# Patient Record
Sex: Female | Born: 1965
Health system: Southern US, Community
[De-identification: ages and names within clinical notes are randomized; demographics above are authoritative.]

## PROBLEM LIST (undated history)

## (undated) DIAGNOSIS — R51 Headache: Secondary | ICD-10-CM

## (undated) DIAGNOSIS — I671 Cerebral aneurysm, nonruptured: Secondary | ICD-10-CM

## (undated) DIAGNOSIS — F32A Depression, unspecified: Secondary | ICD-10-CM

## (undated) DIAGNOSIS — I639 Cerebral infarction, unspecified: Secondary | ICD-10-CM

## (undated) DIAGNOSIS — I6381 Other cerebral infarction due to occlusion or stenosis of small artery: Secondary | ICD-10-CM

## (undated) DIAGNOSIS — M1990 Unspecified osteoarthritis, unspecified site: Secondary | ICD-10-CM

## (undated) DIAGNOSIS — R519 Headache, unspecified: Secondary | ICD-10-CM

## (undated) DIAGNOSIS — F419 Anxiety disorder, unspecified: Secondary | ICD-10-CM

## (undated) DIAGNOSIS — R569 Unspecified convulsions: Secondary | ICD-10-CM

## (undated) DIAGNOSIS — K219 Gastro-esophageal reflux disease without esophagitis: Secondary | ICD-10-CM

## (undated) DIAGNOSIS — I82409 Acute embolism and thrombosis of unspecified deep veins of unspecified lower extremity: Secondary | ICD-10-CM

## (undated) DIAGNOSIS — R112 Nausea with vomiting, unspecified: Secondary | ICD-10-CM

## (undated) DIAGNOSIS — N809 Endometriosis, unspecified: Secondary | ICD-10-CM

## (undated) DIAGNOSIS — R000 Tachycardia, unspecified: Secondary | ICD-10-CM

## (undated) DIAGNOSIS — G4730 Sleep apnea, unspecified: Secondary | ICD-10-CM

## (undated) DIAGNOSIS — F329 Major depressive disorder, single episode, unspecified: Secondary | ICD-10-CM

## (undated) DIAGNOSIS — Z9889 Other specified postprocedural states: Secondary | ICD-10-CM

## (undated) HISTORY — DX: Major depressive disorder, single episode, unspecified: F32.9

## (undated) HISTORY — DX: Depression, unspecified: F32.A

## (undated) HISTORY — DX: Tachycardia, unspecified: R00.0

## (undated) HISTORY — DX: Other cerebral infarction due to occlusion or stenosis of small artery: I63.81

## (undated) HISTORY — PX: OTHER SURGICAL HISTORY: SHX169

## (undated) HISTORY — DX: Anxiety disorder, unspecified: F41.9

## (undated) HISTORY — PX: BREAST BIOPSY: SHX20

## (undated) HISTORY — PX: BRAIN SURGERY: SHX531

## (undated) HISTORY — DX: Endometriosis, unspecified: N80.9

---

## 1998-10-31 ENCOUNTER — Other Ambulatory Visit: Admission: RE | Admit: 1998-10-31 | Discharge: 1998-10-31 | Payer: Self-pay | Admitting: Gynecology

## 1999-12-10 ENCOUNTER — Other Ambulatory Visit: Admission: RE | Admit: 1999-12-10 | Discharge: 1999-12-10 | Payer: Self-pay | Admitting: Gynecology

## 2000-12-03 ENCOUNTER — Inpatient Hospital Stay (HOSPITAL_COMMUNITY): Admission: EM | Admit: 2000-12-03 | Discharge: 2000-12-04 | Payer: Self-pay | Admitting: Emergency Medicine

## 2000-12-03 ENCOUNTER — Encounter: Payer: Self-pay | Admitting: Emergency Medicine

## 2000-12-14 ENCOUNTER — Emergency Department (HOSPITAL_COMMUNITY): Admission: EM | Admit: 2000-12-14 | Discharge: 2000-12-14 | Payer: Self-pay | Admitting: Emergency Medicine

## 2000-12-17 ENCOUNTER — Other Ambulatory Visit (HOSPITAL_COMMUNITY): Admission: RE | Admit: 2000-12-17 | Discharge: 2000-12-27 | Payer: Self-pay | Admitting: Psychiatry

## 2001-02-17 ENCOUNTER — Other Ambulatory Visit: Admission: RE | Admit: 2001-02-17 | Discharge: 2001-02-17 | Payer: Self-pay | Admitting: Gynecology

## 2002-07-20 ENCOUNTER — Other Ambulatory Visit: Admission: RE | Admit: 2002-07-20 | Discharge: 2002-07-20 | Payer: Self-pay | Admitting: Gynecology

## 2003-07-24 ENCOUNTER — Other Ambulatory Visit: Admission: RE | Admit: 2003-07-24 | Discharge: 2003-07-24 | Payer: Self-pay | Admitting: Gynecology

## 2010-12-20 LAB — TB SKIN TEST
Induration: 0 mm
TB Skin Test: NEGATIVE

## 2013-12-10 LAB — HM PAP SMEAR: HM Pap smear: NORMAL

## 2014-03-22 ENCOUNTER — Telehealth: Payer: Self-pay

## 2014-03-22 NOTE — Telephone Encounter (Signed)
Patient has been under a lot of stress lately.  Difficultly sleeping.  She just recently moved to  from [facility].  Having personal issues with husband.  Currently seeing a counselor.  Had a really bad episode of anxiety on 03/21/14. Stated she almost went to the ER.  Hx. Depression and anxiety.    Medication and allergies:  Reviewed and updated  90 day supply/mail order: n/a Local pharmacy:  [id number] - [place], [place] [address]   Immunizations due:  UTD   A/P: Personal, family history and past surgical hx: Reviewed and updated PAP- 12/2013- normal per patient CCS- has never had one MMG- 02/2013- normal per patient Flu- did not receive  Tdap- been within the last 10 years per patient   To Discuss with Provider: Nothing at this time.

## 2014-03-23 ENCOUNTER — Encounter: Payer: Self-pay | Admitting: Family Medicine

## 2014-03-23 ENCOUNTER — Ambulatory Visit (INDEPENDENT_AMBULATORY_CARE_PROVIDER_SITE_OTHER): Payer: BC Managed Care – PPO | Admitting: Family Medicine

## 2014-03-23 VITALS — BP 130/86 | HR 88 | Temp 98.2°F | Resp 16 | Wt 200.0 lb

## 2014-03-23 DIAGNOSIS — F418 Other specified anxiety disorders: Secondary | ICD-10-CM

## 2014-03-23 DIAGNOSIS — F341 Dysthymic disorder: Secondary | ICD-10-CM

## 2014-03-23 MED ORDER — BUPROPION HCL ER (XL) 300 MG PO TB24: 300.0000 mg | ORAL_TABLET | Freq: Every day | ORAL | Status: DC

## 2014-03-23 MED ORDER — ALPRAZOLAM 0.5 MG PO TABS: 0.5000 mg | ORAL_TABLET | Freq: Two times a day (BID) | ORAL | Status: DC | PRN

## 2014-03-23 MED ORDER — TRAZODONE HCL 50 MG PO TABS: 25.0000 mg | ORAL_TABLET | Freq: Every evening | ORAL | Status: DC | PRN

## 2014-03-23 NOTE — Progress Notes (Signed)
   Subjective:    Patient ID: Melissa Morgan, female    DOB: 1965/11/21, 48 y.o.   MRN: [id number]  HPI New to establish.  Previous MD- in [facility]  Anxiety/depression- pt reports she is 'recently and abruptly' separated.  Moved from [facility] to [place].  Seeing therapist.  Pt has hx of similar and is on Wellbutrin SR BID.  Currently having 'horrible, horrible anxiety attacks'.  'unbearable'.  Palpitations, SOB, sweating.  Taking alprazolam as needed for panic attacks and at night for sleep.   Review of Systems For ROS see HPI     Objective:   Physical Exam  Vitals reviewed. Constitutional: She is oriented to person, place, and time. She appears well-developed and well-nourished. She appears distressed (tearful, obviously upset).  Neck: Normal range of motion. Neck supple. No thyromegaly present.  Cardiovascular: Normal rate, regular rhythm and normal heart sounds.   Pulmonary/Chest: Effort normal and breath sounds normal. No respiratory distress. She has no wheezes. She has no rales.  Lymphadenopathy:    She has no cervical adenopathy.  Neurological: She is alert and oriented to person, place, and time.  Skin: Skin is warm and dry.  Psychiatric:  Tearful, very anxious          Assessment & Plan:

## 2014-03-23 NOTE — Progress Notes (Signed)
Pre visit review using our clinic review tool, if applicable. No additional management support is needed unless otherwise documented below in the visit note. 

## 2014-03-23 NOTE — Patient Instructions (Signed)
Follow up in 2-3 weeks to recheck mood Start the new Wellbutrin- just once daily Use the xanax as needed for those panicked moments Start the Trazodone nightly for sleep Call with any questions or concerns Hang in there!!!

## 2014-03-23 NOTE — Assessment & Plan Note (Signed)
New.  Acute and severe.  Increase Wellbutrin to 300mg .  Refill Xanax for prn use.  Applauded pt's efforts to set up counseling.  Will follow closely.

## 2014-04-13 ENCOUNTER — Encounter: Payer: Self-pay | Admitting: Family Medicine

## 2014-04-13 ENCOUNTER — Ambulatory Visit (INDEPENDENT_AMBULATORY_CARE_PROVIDER_SITE_OTHER): Payer: BC Managed Care – PPO | Admitting: Family Medicine

## 2014-04-13 VITALS — BP 128/82 | HR 86 | Temp 98.0°F | Resp 16 | Wt 196.5 lb

## 2014-04-13 DIAGNOSIS — F418 Other specified anxiety disorders: Secondary | ICD-10-CM

## 2014-04-13 DIAGNOSIS — F341 Dysthymic disorder: Secondary | ICD-10-CM

## 2014-04-13 NOTE — Patient Instructions (Signed)
Follow up as scheduled in August Continue the Wellbutrin daily Call with any questions or concerns YOU ARE DOING IT!  You should be proud! Hang in there!

## 2014-04-13 NOTE — Progress Notes (Signed)
Pre visit review using our clinic review tool, if applicable. No additional management support is needed unless otherwise documented below in the visit note. 

## 2014-04-13 NOTE — Assessment & Plan Note (Signed)
Improved since increasing Wellbutrin.  Pt in counseling.  Now has her dog w/ her.  Has enrolled in classes.  Looking for work.  All of these are very positive steps and I applauded her efforts.  Will follow.

## 2014-04-13 NOTE — Progress Notes (Signed)
   Subjective:    Patient ID: Melissa Morgan, female    DOB: 12-Apr-1966, 48 y.o.   MRN: [id number]  HPI Depression- started on Wellbutrin 300 at last visit.  Pt reports 'i'm doing a little better'.  Still having difficulty w/ motivation.  Currently in therapy.  Pt got custody of the dog.  Is still looking for a job- very frustrated by this.  Has enrolled in phlebotomy course.     Review of Systems For ROS see HPI     Objective:   Physical Exam  Vitals reviewed. Constitutional: She is oriented to person, place, and time. She appears well-developed and well-nourished. No distress.  HENT:  Head: Normocephalic and atraumatic.  Neurological: She is alert and oriented to person, place, and time.  Skin: Skin is warm and dry.  Psychiatric: She has a normal mood and affect. Her behavior is normal. Thought content normal.          Assessment & Plan:

## 2014-05-17 ENCOUNTER — Emergency Department (HOSPITAL_BASED_OUTPATIENT_CLINIC_OR_DEPARTMENT_OTHER): Payer: BC Managed Care – PPO

## 2014-05-17 ENCOUNTER — Encounter (HOSPITAL_BASED_OUTPATIENT_CLINIC_OR_DEPARTMENT_OTHER): Payer: Self-pay | Admitting: Emergency Medicine

## 2014-05-17 ENCOUNTER — Emergency Department (HOSPITAL_BASED_OUTPATIENT_CLINIC_OR_DEPARTMENT_OTHER)
Admission: EM | Admit: 2014-05-17 | Discharge: 2014-05-17 | Disposition: A | Discharge status: Home / Self Care | Payer: BC Managed Care – PPO | Attending: Emergency Medicine | Admitting: Emergency Medicine

## 2014-05-17 ENCOUNTER — Emergency Department (HOSPITAL_COMMUNITY): Payer: BC Managed Care – PPO

## 2014-05-17 DIAGNOSIS — R51 Headache: Secondary | ICD-10-CM | POA: Insufficient documentation

## 2014-05-17 DIAGNOSIS — F411 Generalized anxiety disorder: Secondary | ICD-10-CM | POA: Insufficient documentation

## 2014-05-17 DIAGNOSIS — R0602 Shortness of breath: Secondary | ICD-10-CM | POA: Insufficient documentation

## 2014-05-17 DIAGNOSIS — R112 Nausea with vomiting, unspecified: Secondary | ICD-10-CM | POA: Insufficient documentation

## 2014-05-17 DIAGNOSIS — R197 Diarrhea, unspecified: Secondary | ICD-10-CM | POA: Insufficient documentation

## 2014-05-17 DIAGNOSIS — I671 Cerebral aneurysm, nonruptured: Secondary | ICD-10-CM | POA: Insufficient documentation

## 2014-05-17 DIAGNOSIS — R079 Chest pain, unspecified: Secondary | ICD-10-CM | POA: Insufficient documentation

## 2014-05-17 HISTORY — DX: Headache, unspecified: R51.9

## 2014-05-17 HISTORY — DX: Headache: R51

## 2014-05-17 HISTORY — DX: Cerebral infarction, unspecified: I63.9

## 2014-05-17 LAB — BASIC METABOLIC PANEL
Anion gap: 17 — ABNORMAL HIGH (ref 5–15)
BUN: 16 mg/dL (ref 6–23)
CHLORIDE: 100 meq/L (ref 96–112)
CO2: 23 mEq/L (ref 19–32)
Calcium: 10.4 mg/dL (ref 8.4–10.5)
Creatinine, Ser: 0.8 mg/dL (ref 0.50–1.10)
GFR calc Af Amer: >90 mL/min (ref >90)
GFR calc non Af Amer: 86 mL/min — ABNORMAL LOW (ref >90)
GLUCOSE: 103 mg/dL — AB (ref 70–99)
POTASSIUM: 3.9 meq/L (ref 3.7–5.3)
SODIUM: 140 meq/L (ref 137–147)

## 2014-05-17 LAB — CBC WITH DIFFERENTIAL/PLATELET
Basophils Absolute: 0 10*3/uL (ref 0.0–0.1)
Basophils Relative: 0 % (ref 0–1)
Eosinophils Absolute: 0.1 10*3/uL (ref 0.0–0.7)
Eosinophils Relative: 1 % (ref 0–5)
HCT: 41.6 % (ref 36.0–46.0)
Hemoglobin: 14.4 g/dL (ref 12.0–15.0)
Lymphocytes Relative: 36 % (ref 12–46)
Lymphs Abs: 3.6 10*3/uL (ref 0.7–4.0)
MCH: 30.4 pg (ref 26.0–34.0)
MCHC: 34.6 g/dL (ref 30.0–36.0)
MCV: 87.9 fL (ref 78.0–100.0)
Monocytes Absolute: 0.9 10*3/uL (ref 0.1–1.0)
Monocytes Relative: 9 % (ref 3–12)
Neutro Abs: 5.3 10*3/uL (ref 1.7–7.7)
Neutrophils Relative %: 53 % (ref 43–77)
Platelets: 243 10*3/uL (ref 150–400)
RBC: 4.73 MIL/uL (ref 3.87–5.11)
RDW: 13.5 % (ref 11.5–15.5)
WBC: 10 10*3/uL (ref 4.0–10.5)

## 2014-05-17 LAB — TROPONIN I

## 2014-05-17 MED ORDER — SODIUM CHLORIDE 0.9 % IV SOLN
Freq: Once | INTRAVENOUS | Status: AC
  Administered 2014-05-17: 15:00:00 via INTRAVENOUS

## 2014-05-17 MED ORDER — HYDROCODONE-ACETAMINOPHEN 5-325 MG PO TABS: 1.0000 | ORAL_TABLET | ORAL | Status: DC | PRN

## 2014-05-17 MED ORDER — ONDANSETRON HCL 4 MG/2ML IJ SOLN
4.0000 mg | Freq: Once | INTRAMUSCULAR | Status: AC
  Administered 2014-05-17: 4 mg via INTRAVENOUS
  Filled 2014-05-17: qty 2

## 2014-05-17 MED ORDER — MORPHINE SULFATE 4 MG/ML IJ SOLN
4.0000 mg | Freq: Once | INTRAMUSCULAR | Status: AC
  Administered 2014-05-17: 4 mg via INTRAVENOUS
  Filled 2014-05-17: qty 1

## 2014-05-17 MED ORDER — LORAZEPAM 2 MG/ML IJ SOLN
INTRAMUSCULAR | Status: AC
  Filled 2014-05-17: qty 1

## 2014-05-17 MED ORDER — LORAZEPAM 2 MG/ML IJ SOLN
1.0000 mg | Freq: Once | INTRAMUSCULAR | Status: AC
  Administered 2014-05-17: 1 mg via INTRAVENOUS

## 2014-05-17 MED ORDER — ONDANSETRON 8 MG PO TBDP: 8.0000 mg | ORAL_TABLET | Freq: Three times a day (TID) | ORAL | Status: DC | PRN

## 2014-05-17 NOTE — ED Notes (Signed)
Pt transported to MRI 

## 2014-05-17 NOTE — Discharge Instructions (Signed)
Cerebral Aneurysm  An aneurysm is the bulging or ballooning out of part of the weakened wall of a vein or artery. An aneurysm in the vein or artery of the brain is called a brain aneurysm, or cerebral aneurysm.   Aneurysms are a risk to your health because they may leak or rupture. Once the aneurysm leaks or ruptures, bleeding occurs. If the bleeding occurs within the brain tissue, the condition is called an intracerebral hemorrhage. An intracerebral hemorrhage can result in a hemorrhagic stroke. If the bleeding occurs in the area between the brain and the thin tissues that cover the brain, the condition is called a subarachnoid hemorrhage. This increases the pressure on the brain and causes some areas of the brain to not get the necessary blood flow. The blood from the ruptured aneurysm collects and presses on the surrounding brain tissue. A subarachnoid hemorrhage can cause a stroke. A ruptured cerebral aneurysm is a medical emergency. This can cause permanent damage and loss of brain function.  CAUSES  A cerebral aneurysm is caused when a weakened part of the blood vessel expands. The blood vessel expands due to the constant pressure from the flow of blood through the weakened blood vessel. Usually the aneurysm expands slowly. As the weakened aneurysm expands, the walls of the aneurysm become weaker. Aneurysms may be associated with diseases that weaken and damage the walls of your blood vessels or blood vessels that develop abnormally. Some known causes for cerebral aneurysms are:  · Head trauma.  · Infection.  · Use of "recreational drugs" such as cocaine or amphetamines.  RISK FACTORS  People at risk for a cerebral aneurysm or hemorrhagic stroke usually have one or more risk factors, which include:  · Having high blood pressure (hypertension).  · Abusing alcohol.  · Having abnormal blood vessels present since birth.  · Having certain bleeding disorders, such as hemophilia, sickle cell disease, or liver  disease.  · Taking blood thinners (anticoagulants).  · Smoking.  SIGNS AND SYMPTOMS   The signs and symptoms of an unruptured cerebral aneurysm will partly depend on its size and rate of growth. A small, unchanging aneurysm generally does not produce symptoms. A larger aneurysm that is steadily growing can increase pressure on the brain or nerves. That increased pressure from the unruptured cerebral aneurysm can cause:  · A headache.  · Problems with your vision.  · Numbness or weakness in an arm or leg.  · Problems with memory.  · Problems speaking.  · Seizures.  If an aneurysm leaks or bursts, it can cause a stroke and be life threatening. Symptoms may include:  · A sudden, severe headache with no known cause. The headache is often described as the worst headache ever experienced.  · Nausea or vomiting, especially when combined with other symptoms such as a headache.  · Sudden weakness or numbness of the face, arm, or leg, especially on one side of the body.  · Sudden trouble walking or difficulty moving arms or legs.  · Sudden confusion.  · Sudden personality changes.  · Trouble speaking (aphasia) or understanding.  · Difficulty swallowing.  · Sudden trouble seeing in one or both eyes.  · Double vision.  · Dizziness.  · Loss of balance or coordination.  · Intolerance to light.  · Stiff neck.  DIAGNOSIS   A CTA (computed tomographic angiography) may be performed to diagnose an aneurysm. A CTA uses dye and a CT scanner to take images of your blood vessels.   a cerebral angiogram. A cerebral angiogram requires a tube called a catheter to be inserted into a blood vessel and advanced to the blood vessels in your neck. Dye is then injected while X-ray images are taken to show the blood vessels in  your brain. TREATMENT  Unruptured Aneurysms Treatment is complex when an aneurysm is found and it is not causing problems. Treatment is very individualized, as each case is different. Many things must be considered, such as the size and exact location of your aneurysm, your age, your overall health, and your feelings and preferences. Small aneurysms in certain locations of the brain have a very low chance of bleeding or rupturing. These small aneurysms may not be treated. However, depending on the size and location of the aneurysm, treatments may be recommended and include:  Coiling. During this procedure, a catheter is inserted and advanced through a blood vessel. Once the catheter reaches the aneurysm, tiny coils are used to block blood flow into the aneurysm.  Surgical clipping. During surgery, a clip is placed at the base of the aneurysm. The clip prevents blood from continuing to enter the aneurysm. Ruptured Aneurysms Immediate emergency surgery may be needed to help prevent damage to the brain and to reduce the risk of rebleeding. Timing of treatment is an important factor in the prevention of complications. Successful early treatment of a ruptured aneurysm (within the first 3 days of a bleed) helps to prevent rebleeding and blood vessel spasm. In some cases, there may be a reason to treat later (10-14 days after a rupture). Many things are considered when making this decision and each case is handled individually. HOME CARE INSTRUCTIONS  Take all your medicines exactly as instructed. Do not take any over-the-counter drugs or supplements without talking to your health care provider.  Eat healthy foods. It is recommended that you eat 5 or more servings of fruits and vegetables each day. Foods may need to be a special consistency (soft or pureed), or small bites may need to be taken if you have had a ruptured aneurysm or stroke. Certain diets may be prescribed to address high blood pressure, high  cholesterol, diabetes, or obesity.  A low salt (sodium), low saturated fat, low trans fat, low cholesterol diet is recommended to manage high blood pressure.  A low saturated fat, low trans fat, low cholesterol, and high fiber diet is recommended to control cholesterol levels.  A controlled carbohydrate, controlled sugar diet is recommended to manage diabetes.  A reduced calorie, low sodium, low saturated fat, low trans fat, low cholesterol diet is recommended to manage obesity.  Maintain a healthy weight.  Stay physically active. It is recommended that you get at least 30 minutes of activity on most or all days.  Do not smoke.  Limit alcohol use. Moderate alcohol use is considered to be:  No more than 2 drinks each day for men.  No more than 1 drink each day for nonpregnant women.  Stop drug abuse.  A safe home environment is important to reduce the risk of falls. Your health care provider may arrange for specialists to evaluate your home. Having grab bars in the bedroom and bathroom is often important. Your health care provider may arrange for special equipment to be used at home, such as raised toilets and a seat for the shower.  Physical, occupational, and speech therapy. Ongoing therapy may be needed to maximize your recovery after a ruptured aneurysm or stroke. If you have been advised to use a  walker or a cane, use it at all times. Be sure to keep your therapy appointments.  Follow all instructions for follow up with your health care provider. This is very important. This includes any referrals, physical therapy, rehabilitation, and laboratory tests. Proper follow up may prevent an aneurysm rupture or a stroke. SEEK IMMEDIATE MEDICAL CARE IF:  You have a sudden, severe headache with no known cause.  You have sudden nausea or vomiting with a severe headache.  You have sudden weakness or numbness of the face, arm, or leg, especially on one side of the body.  You have sudden  trouble walking or difficulty moving arms or legs.  You have sudden confusion.  You have trouble speaking or understanding.  You have sudden trouble seeing in one or both eyes.  You have a sudden loss of balance or coordination.  You have a stiff neck.  You have difficulty breathing.  You have a partial or total loss of consciousness. Any of these symptoms may represent a serious problem that is an emergency. Do not wait to see if the symptoms will go away. Get medical help at once. Call your local emergency services (911 in [place] Do not drive yourself to the hospital. Document Released: 07/18/2002 Document Revised: 08/16/2013 Document Reviewed: 04/13/2013 [facility] Patient Information 2015 [place]. This information is not intended to replace advice given to you by your health care provider. Make sure you discuss any questions you have with your health care provider.

## 2014-05-17 NOTE — ED Notes (Signed)
Reports given to Gilbert charge at [facility] , [place] to transport

## 2014-05-17 NOTE — ED Notes (Signed)
Patient transported to MRI 

## 2014-05-17 NOTE — ED Notes (Signed)
Per EMS, pt coming from [facility] fo an MRI concerning a aneurism. Pt alert x 4. No deficits at this time. Pt is experiencing left arm numbness.

## 2014-05-17 NOTE — ED Notes (Signed)
Pt placed on monitor. Pt monitored by 12 lead, blood pressure, and pulse ox.

## 2014-05-17 NOTE — ED Provider Notes (Signed)
Pt was transferred [facility] for MRI MRA after having a probable 9 mm aneurysm noted on CT scan.  Pt presented with complaints of headache and tingling in her right hand. Please see Dr Ross Marcus original note for further information. Pt does still complain of  A headache. Physical Exam  BP 110/74  Pulse 80  Temp(Src) 98.2 F (36.8 C) (Oral)  Resp 13  Ht 5\' 7"  (1.702 m)  Wt 196 lb (88.905 kg)  BMI 30.69 kg/m2  SpO2 98%  Physical Exam  Nursing note and vitals reviewed. Constitutional: She is oriented to person, place, and time. She appears well-developed and well-nourished. No distress.  HENT:  Head: Normocephalic and atraumatic.  Right Ear: External ear normal.  Left Ear: External ear normal.  Eyes: Conjunctivae are normal. Right eye exhibits no discharge. Left eye exhibits no discharge. No scleral icterus.  Neck: Neck supple. No tracheal deviation present.  Cardiovascular: Normal rate.   Pulmonary/Chest: Effort normal. No stridor. No respiratory distress.  Musculoskeletal: She exhibits no edema.  Neurological: She is alert and oriented to person, place, and time. She has normal strength. She displays no tremor. No cranial nerve deficit (no facial droop, nl speech, EOMI) or sensory deficit. She exhibits normal muscle tone.  No pronator drift, 5/5 ue and le strength  Skin: Skin is warm and dry. No rash noted.  Psychiatric: She has a normal mood and affect.    ED Course  Procedures  MDM MRI MRA ordered.  Will continue to monitor  MRI MRA results were reviewed with Dr Ronnald Ramp who was discussing the case with Dr Cyndy Freeze.  No signs of hemorrhage.  Do not feel that lp is necessary at this time.  Pt is comfortable.  She does have history of recurrent headaches.  It is possible this is an incidental finding.  She will follow up with Dr Cyndy Freeze as an outpatient in the office.     Dorie Rank, MD 05/17/14 1945

## 2014-05-17 NOTE — ED Notes (Signed)
Dineen Kid pts friend can be called with any need can be reached at [phone] or [phone]

## 2014-05-17 NOTE — ED Notes (Addendum)
Talk with EMS rep [phone] [name], stated that she would send out a truck

## 2014-05-17 NOTE — ED Notes (Signed)
Pt having some diarrhea, with nausea since Saturday.  Pt started having anxiety since yesterday.  Pt diaphoretic, hyperventilating, having numbness in hands, and feels like she is "going to pass out".

## 2014-05-17 NOTE — ED Provider Notes (Signed)
CSN: [id number]     Arrival date & time 05/17/14  1318 History   First MD Initiated Contact with Patient 05/17/14 1404     Chief Complaint  Patient presents with  . Anxiety  . Dizziness     (Consider location/radiation/quality/duration/timing/severity/associated sxs/prior Treatment) HPI Comments: Patient presents to the ER for evaluation of multiple complaints. Patient reports that she had a severe right-sided headache yesterday which resolved during the course of the day. She had onset of headache once again around 12:30 today. She noticed left hand numbness and tingling with the onset of a headache. She felt dizzy like he was going to pass out. This developed into what she thinks was a panic attack. She started having racing heartbeat, nausea, diarrhea, diaphoresis, shortness of breath with hyperventilation and chest discomfort. These chest and breathing difficulties are improving.  Patient tells me that she has a history of a lacunar infarct, but also that there was bleeding in her brain without stroke. [place], records are not immediately available.  Patient is a 48 y.o. female presenting with anxiety and dizziness.  Anxiety Associated symptoms include chest pain, headaches and shortness of breath.  Dizziness Associated symptoms: chest pain, diarrhea, headaches, nausea, shortness of breath and vomiting     Past Medical History  Diagnosis Date  . Depression   . Tachycardia   . Anxiety   . Lacunar infarction   . Endometriosis   . Stroke   . Headache    Past Surgical History  Procedure Laterality Date  . Lasar laparoscopy      for endometriosis   Family History  Problem Relation Age of Onset  . Hypertension Brother     2 out of 3 brothers  . Heart attack Brother     2 out of 3 brothers  . Diabetes Brother   . Asthma Sister   . Hypertension Mother   . Heart attack Father   . Breast cancer Sister    History  Substance Use Topics  .  Smoking status: Current Some Day Smoker  . Smokeless tobacco: Never Used  . Alcohol Use: No   OB History   Grav Para Term Preterm Abortions TAB SAB Ect Mult Living                 Review of Systems  Respiratory: Positive for shortness of breath.   Cardiovascular: Positive for chest pain.  Gastrointestinal: Positive for nausea, vomiting and diarrhea.  Neurological: Positive for dizziness, numbness and headaches.  Psychiatric/Behavioral: The patient is nervous/anxious.   All other systems reviewed and are negative.     Allergies  Codeine and Imitrex  Home Medications   Prior to Admission medications   Medication Sig Start Date End Date Taking? Authorizing Provider  ALPRAZolam [name]) 0.5 MG tablet Take 1 tablet (0.5 mg total) by mouth 2 (two) times daily as needed for anxiety. 03/23/14   Midge Minium, MD  Ascorbic Acid (VITAMIN C) 1000 MG tablet Take 1,000 mg by mouth daily.    Historical Provider, MD  aspirin 81 MG tablet Take 81 mg by mouth daily.    Historical Provider, MD  buPROPion (WELLBUTRIN XL) 300 MG 24 hr tablet Take 1 tablet (300 mg total) by mouth daily. 03/23/14   Midge Minium, MD  Calcium Citrate-Vitamin D (CITRACAL + D PO) Take 1 tablet by mouth daily.    Historical Provider, MD  ibuprofen (ADVIL,MOTRIN) 800 MG tablet Take 800 mg by mouth  every 8 (eight) hours as needed.    Historical Provider, MD  metoprolol tartrate (LOPRESSOR) 25 MG tablet Take 25 mg by mouth 2 (two) times daily.    Historical Provider, MD  Omega-3 Fatty Acids (OMEGA-3 FISH OIL) 500 MG CAPS Take 1 capsule by mouth 2 (two) times daily.    Historical Provider, MD  traZODone (DESYREL) 50 MG tablet Take 0.5-1 tablets (25-50 mg total) by mouth at bedtime as needed for sleep. 03/23/14   Midge Minium, MD   BP 115/83  Pulse 88  Temp(Src) 98.2 F (36.8 C) (Oral)  Resp 16  Ht 5\' 7"  (1.702 m)  Wt 196 lb (88.905 kg)  BMI 30.69 kg/m2  SpO2 100% Physical Exam  Constitutional: She is  oriented to person, place, and time. She appears well-developed and well-nourished. No distress.  HENT:  Head: Normocephalic and atraumatic.  Right Ear: Hearing normal.  Left Ear: Hearing normal.  Nose: Nose normal.  Mouth/Throat: Oropharynx is clear and moist and mucous membranes are normal.  Eyes: Conjunctivae and EOM are normal. Pupils are equal, round, and reactive to light.  Neck: Normal range of motion. Neck supple.  Cardiovascular: Regular rhythm, S1 normal and S2 normal.  Exam reveals no gallop and no friction rub.   No murmur heard. Pulmonary/Chest: Effort normal and breath sounds normal. No respiratory distress. She exhibits no tenderness.  Abdominal: Soft. Normal appearance and bowel sounds are normal. There is no hepatosplenomegaly. There is no tenderness. There is no rebound, no guarding, no tenderness at McBurney's point and negative Murphy's sign. No hernia.  Musculoskeletal: Normal range of motion.  Neurological: She is alert and oriented to person, place, and time. She has normal strength. No cranial nerve deficit or sensory deficit. Coordination normal. GCS eye subscore is 4. GCS verbal subscore is 5. GCS motor subscore is 6.  Extraocular muscle movement: normal No visual field cut Pupils: equal and reactive both direct and consensual response is normal No nystagmus present        Sensory function is intact to light touch, pinprick Proprioception intact  Grip strength 5/5 symmetric in upper extremities Lower extremity strength 5/5 against gravity No pronator drift Normal finger to nose bilaterally Normal heel to shin bilaterally  Gait: normal    Skin: Skin is warm, dry and intact. No rash noted. No cyanosis.  Psychiatric: She has a normal mood and affect. Her speech is normal and behavior is normal. Thought content normal.    ED Course  Procedures (including critical care time) Labs Review Labs Reviewed  CBC WITH DIFFERENTIAL  BASIC METABOLIC PANEL   TROPONIN I    Imaging Review Ct Head Wo Contrast  05/17/2014   CLINICAL DATA:  Numbness in hands.  Diaphoresis.  EXAM: CT HEAD WITHOUT CONTRAST  TECHNIQUE: Contiguous axial images were obtained from the base of the skull through the vertex without intravenous contrast.  COMPARISON:  None.  FINDINGS: An approximately 9 mm hyperdense structure is noted in the region of the trifurcation of the right middle cerebral artery. This is most likely a right middle cerebral artery aneurysm. A subtle amount of adjacent hemorrhage cannot be excluded. Emergent neurosurgical consultation is suggested. No other focal abnormality. No hydrocephalus or mass effect. Visualized orbits unremarkable. Visualized paranasal sinuses and mastoids clear. No acute bony abnormality.  IMPRESSION: 9 mm hyperdense lesion in the region of the trifurcation of the right middle cerebral artery. This most likely represents an aneurysm of the middle cerebral artery. Adjacent subtle hemorrhage may be  present. Emergency neurosurgical consultation suggested. MRA can be obtained for further evaluation. Critical Value/emergent results were called by telephone at the time of interpretation on 05/17/2014 at 2:46 PM to Dr. Joseph Berkshire , who verbally acknowledged these results.   Electronically Signed   By: Marcello Moores  Register   On: 05/17/2014 14:48     EKG Interpretation   Date/Time:  Thursday May 17 2014 14:42:34 EDT Ventricular Rate:  77 PR Interval:  168 QRS Duration: 86 QT Interval:  398 QTC Calculation: 450 R Axis:   51 Text Interpretation:  Normal sinus rhythm Normal ECG Confirmed by Dalyn Kjos   MD, Mckaylah Bettendorf ([id number]) on 05/17/2014 2:46:45 PM      MDM   Final diagnoses:  Aneurysm of middle cerebral artery   Patient presents with many symptoms, I cannot explain all of them with one diagnosis. The patient has headache with left arm numbness and tingling, but no appreciable objective weakness. Because of this, a CAT scan was  ordered. I did consider the possibility of acute stroke, as the symptoms began 2 hours before my evaluation. She did, however tell me that she has a history of intracranial bleed, essentially ruling out use of TPA. CT scan shows a 9 mm aneurysm of the right MCA. Radiologist is concerned about the possibility of leaking of blood. This was discussed acutely with Doctor Ronnald Ramp, neurosurgery. He was not sure if there was evidence of leaking or not, recommend MRI and MRA. Patient will be transferred to [facility] for further treatment. Treatment, according to Doctor Ronnald Ramp, if no leakage from aneurysm, which simply need followup for the aneurysm as an outpatient. The patient has, however, been experiencing chest pain and also has a history of stroke with left hand numbness. Would likely need further workup for possible TIA versus CVA if negative leakage from aneurysm.  Case discussed briefly with Doctor Vanita Panda in [facility]. Patient will be transferred to the [facility] emergency department for MRI, MRA brain and further decisions will be made based on results.   Orpah Greek, MD 05/17/14 1536

## 2014-05-24 ENCOUNTER — Telehealth: Payer: Self-pay | Admitting: Family Medicine

## 2014-05-24 NOTE — Telephone Encounter (Signed)
Patient states that she went and purchased a fleets enema and used. She is feeling much better. Recently seen at ED has a follow with Dr Chaya Jan.

## 2014-05-24 NOTE — Telephone Encounter (Signed)
Caller name: Charolette  Call back [place]: Dahlia Bailiff   Reason for call:  Pt is having trouble pass stool.  Pt is feeling a lot of discomfort.  Pt wants to know what to do.

## 2014-05-30 ENCOUNTER — Telehealth: Payer: Self-pay | Admitting: Family Medicine

## 2014-05-30 MED ORDER — ALPRAZOLAM 0.5 MG PO TABS: 0.5000 mg | ORAL_TABLET | Freq: Two times a day (BID) | ORAL | Status: DC | PRN

## 2014-05-30 NOTE — Telephone Encounter (Signed)
Pt was diagnosed with aneurysm of middle cerebral artery on 7/9. Please advise.

## 2014-05-30 NOTE — Telephone Encounter (Signed)
Caller name: Milly  Call back [phone]   Reason for call:  Pt wants to discuss UC and ED visits.  They found an aneurism.  Wants to update Dr. Birdie Riddle.  Also is having a lot of anxiety due to this and is wanting to know if she can get a refill on rx  ALPRAZolam (XANAX) 0.5 MG tablet .  Please contact pt.

## 2014-05-30 NOTE — Telephone Encounter (Signed)
Med filled and pt notified.  

## 2014-05-30 NOTE — Telephone Encounter (Signed)
Is pt following up w/ Dr Christella Noa (Neurosurg) as stated in ER notes? [place] for refill on xanax

## 2014-06-06 ENCOUNTER — Telehealth: Payer: Self-pay

## 2014-06-06 DIAGNOSIS — G43901 Migraine, unspecified, not intractable, with status migrainosus: Secondary | ICD-10-CM

## 2014-06-06 MED ORDER — HYDROCODONE-ACETAMINOPHEN 5-325 MG PO TABS: 1.0000 | ORAL_TABLET | ORAL | Status: DC | PRN

## 2014-06-06 NOTE — Telephone Encounter (Signed)
Pt's daughter called back to give the name, Dr. Ashok Pall, [facility], [phone]

## 2014-06-06 NOTE — Telephone Encounter (Signed)
Caller: Melissa Morgan/Patient; PCP: Midge Minium.; CB#: [phone]; Call regarding Migraines; Has had migraines chronically for about a year, headache started last night 06/04/14 subsided some earlier today 06/05/14 but progressed to 8/10 on pain scale by this afternoon. + nausea, no vomiting. Has taken some Tylenol but is unable to take NSAIDs due to hx aneurysm. LMP- unknown approx 1 year ago. Afebrile. Triaged per Headache- Needs to see provider within 24 hrs for "Constant headache AND unrelieved with nonprescription medications." Per SO- Rx for Ultram and Phenergen called to [facility] on [address]. Care advice given, call back parameters discussed. Pt advised to f/u with office in am when open.  Patient has not called in. Please follow up with.

## 2014-06-06 NOTE — Telephone Encounter (Signed)
Discussed Dr. Birdie Riddle recommendations with patient.  Pt stated understanding and was in agreed with plan.    Med filled for Vicodin #30, 0 refills.  Prescription placed up front.  Pt made aware that prescription is ready for pick up.   Neuro referral placed.  Pt is aware.

## 2014-06-06 NOTE — Telephone Encounter (Signed)
Another option would be Hydrocodone/Acetaminophen (Vicodin) 5/325mg .  Pt has codeine allergy listed but only adverse effect noted is nausea and pt has medication available to treat nausea.  [place] for #30, no refills.  1 tab every 4-6 hrs as needed.  Pt has appt upcoming w/ neurosurgeon but if migraines are ongoing problem for pt, needs neuro referral

## 2014-06-06 NOTE — Telephone Encounter (Signed)
Pt also has imitrex listed as allergy so she definitely needs a neuro evaluation to have a better medication plan when migraines do occur

## 2014-06-06 NOTE — Telephone Encounter (Addendum)
Pt called to follow up.  Tramadol causes patient to feel like she is wide awake.  Did not sleep last night.  Last dose of Tramadol-4 am.  HA has improved.  Rated 3/10.  Wants something other than Tramadol prescribed.  Pt states she just cannot take having another headache as bad as she has been having the past couple of days.    Dx. Aneurysm of middle cerebral artery; Waiting to have CT Angiogram; Possible upcoming craniotomy.  Please advise.

## 2014-06-07 ENCOUNTER — Other Ambulatory Visit (HOSPITAL_COMMUNITY): Payer: Self-pay | Admitting: Neurosurgery

## 2014-06-07 DIAGNOSIS — I671 Cerebral aneurysm, nonruptured: Secondary | ICD-10-CM

## 2014-06-08 ENCOUNTER — Ambulatory Visit (HOSPITAL_COMMUNITY): Payer: BC Managed Care – PPO

## 2014-06-11 ENCOUNTER — Ambulatory Visit (HOSPITAL_COMMUNITY)
Admission: RE | Admit: 2014-06-11 | Discharge: 2014-06-11 | Disposition: A | Discharge status: Home / Self Care | Payer: BC Managed Care – PPO | Source: Ambulatory Visit | Attending: Neurosurgery | Admitting: Neurosurgery

## 2014-06-11 ENCOUNTER — Other Ambulatory Visit: Payer: Self-pay | Admitting: Neurosurgery

## 2014-06-11 DIAGNOSIS — I671 Cerebral aneurysm, nonruptured: Secondary | ICD-10-CM | POA: Insufficient documentation

## 2014-06-11 MED ORDER — IOHEXOL 350 MG/ML SOLN
50.0000 mL | Freq: Once | INTRAVENOUS | Status: AC | PRN
  Administered 2014-06-11: 50 mL via INTRAVENOUS

## 2014-06-25 ENCOUNTER — Ambulatory Visit: Payer: Self-pay | Admitting: Family Medicine

## 2014-06-26 ENCOUNTER — Encounter (HOSPITAL_COMMUNITY): Payer: Self-pay | Admitting: Pharmacy Technician

## 2014-06-27 ENCOUNTER — Encounter (HOSPITAL_COMMUNITY): Payer: Self-pay

## 2014-06-27 ENCOUNTER — Encounter (HOSPITAL_COMMUNITY)
Admission: RE | Admit: 2014-06-27 | Discharge: 2014-06-27 | Disposition: A | Discharge status: Home / Self Care | Payer: BC Managed Care – PPO | Source: Ambulatory Visit | Attending: Neurosurgery | Admitting: Neurosurgery

## 2014-06-27 ENCOUNTER — Encounter (HOSPITAL_COMMUNITY)
Admission: RE | Admit: 2014-06-27 | Discharge: 2014-06-27 | Disposition: A | Discharge status: Home / Self Care | Payer: BC Managed Care – PPO | Source: Ambulatory Visit | Attending: Anesthesiology | Admitting: Anesthesiology

## 2014-06-27 DIAGNOSIS — Z01818 Encounter for other preprocedural examination: Secondary | ICD-10-CM | POA: Diagnosis present

## 2014-06-27 DIAGNOSIS — I671 Cerebral aneurysm, nonruptured: Secondary | ICD-10-CM | POA: Diagnosis not present

## 2014-06-27 HISTORY — DX: Nausea with vomiting, unspecified: R11.2

## 2014-06-27 HISTORY — DX: Unspecified convulsions: R56.9

## 2014-06-27 HISTORY — DX: Gastro-esophageal reflux disease without esophagitis: K21.9

## 2014-06-27 HISTORY — DX: Unspecified osteoarthritis, unspecified site: M19.90

## 2014-06-27 HISTORY — DX: Other specified postprocedural states: Z98.890

## 2014-06-27 LAB — BASIC METABOLIC PANEL
Anion gap: 14 (ref 5–15)
BUN: 13 mg/dL (ref 6–23)
CHLORIDE: 103 meq/L (ref 96–112)
CO2: 23 mEq/L (ref 19–32)
Calcium: 9.1 mg/dL (ref 8.4–10.5)
Creatinine, Ser: 0.83 mg/dL (ref 0.50–1.10)
GFR calc non Af Amer: 82 mL/min — ABNORMAL LOW (ref >90)
GLUCOSE: 112 mg/dL — AB (ref 70–99)
POTASSIUM: 4.2 meq/L (ref 3.7–5.3)
Sodium: 140 mEq/L (ref 137–147)

## 2014-06-27 LAB — CBC
HCT: 38.7 % (ref 36.0–46.0)
Hemoglobin: 13.3 g/dL (ref 12.0–15.0)
MCH: 30.3 pg (ref 26.0–34.0)
MCHC: 34.4 g/dL (ref 30.0–36.0)
MCV: 88.2 fL (ref 78.0–100.0)
Platelets: 218 K/uL (ref 150–400)
RBC: 4.39 MIL/uL (ref 3.87–5.11)
RDW: 13.3 % (ref 11.5–15.5)
WBC: 7.3 K/uL (ref 4.0–10.5)

## 2014-06-27 LAB — SURGICAL PCR SCREEN
MRSA, PCR: NEGATIVE
Staphylococcus aureus: NEGATIVE

## 2014-06-27 LAB — ABO/RH: ABO/RH(D): A POS

## 2014-06-27 NOTE — Pre-Procedure Instructions (Addendum)
Melissa Morgan  06/27/2014   Your procedure is scheduled on:  07/06/14  Report to [facility] cone short stay admitting at 530 AM.  Call this number if you have problems the morning of surgery: [phone]   Remember:   Do not eat food or drink liquids after midnight.   Take these medicines the morning of surgery with A SIP OF WATER: xanax,wellbutrin, painmed as needed,metoprolol      Take all meds as ordered until day of surgery except as instructed below or per dr    Melissa Morgan all herbel meds, nsaids (aleve,naproxen,advil,ibuprofen) 5 days prior to surgery(07/01/14) including vitamins,aspirin   Do not wear jewelry, make-up or nail polish.  Do not wear lotions, powders, or perfumes. You may wear deodorant.  Do not shave 48 hours prior to surgery. Men may shave face and neck.  Do not bring valuables to the hospital.  [facility] is not responsible                  for any belongings or valuables.               Contacts, dentures or bridgework may not be worn into surgery.  Leave suitcase in the car. After surgery it may be brought to your room.  For patients admitted to the hospital, discharge time is determined by your                treatment team.               Patients discharged the day of surgery will not be allowed to drive  home.  Name and phone number of your driver:   Special Instructions:  Special Instructions: [name] - Preparing for Surgery  Before surgery, you can play an important role.  Because skin is not sterile, your skin needs to be as free of germs as possible.  You can reduce the number of germs on you skin by washing with CHG (chlorahexidine gluconate) soap before surgery.  CHG is an antiseptic cleaner which kills germs and bonds with the skin to continue killing germs even after washing.  Please DO NOT use if you have an allergy to CHG or antibacterial soaps.  If your skin becomes reddened/irritated stop using the CHG and inform your nurse when you arrive at Short  Stay.  Do not shave (including legs and underarms) for at least 48 hours prior to the first CHG shower.  You may shave your face.  Please follow these instructions carefully:   1.  Shower with CHG Soap the night before surgery and the morning of Surgery.  2.  If you choose to wash your hair, wash your hair first as usual with your normal shampoo.  3.  After you shampoo, rinse your hair and body thoroughly to remove the Shampoo.  4.  Use CHG as you would any other liquid soap.  You can apply chg directly  to the skin and wash gently with scrungie or a clean washcloth.  5.  Apply the CHG Soap to your body ONLY FROM THE NECK DOWN.  Do not use on open wounds or open sores.  Avoid contact with your eyes ears, mouth and genitals (private parts).  Wash genitals (private parts)       with your normal soap.  6.  Wash thoroughly, paying special attention to the area where your surgery will be performed.  7.  Thoroughly rinse your body with warm water from the neck down.  8.  DO NOT shower/wash with your normal soap after using and rinsing off the CHG Soap.  9.  Pat yourself dry with a clean towel.            10.  Wear clean pajamas.            11.  Place clean sheets on your bed the night of your first shower and do not sleep with pets.  Day of Surgery  Do not apply any lotions/deodorants the morning of surgery.  Please wear clean clothes to the hospital/surgery center.   Please read over the following fact sheets that you were given: Pain Booklet, Coughing and Deep Breathing, Blood Transfusion Information, MRSA Information and Surgical Site Infection Prevention

## 2014-07-05 MED ORDER — CEFAZOLIN SODIUM-DEXTROSE 2-3 GM-% IV SOLR
2.0000 g | INTRAVENOUS | Status: AC
  Administered 2014-07-06: 2 g via INTRAVENOUS
  Filled 2014-07-05: qty 50

## 2014-07-05 NOTE — H&P (Signed)
BP 117/71  Pulse 71  Temp(Src) 97.1 F (36.2 C) (Oral)  Resp 20  Wt 87.091 kg (192 lb)  SpO2 99%  Melissa Morgan is a 48 year old woman who presented to the [facility] Emergency Room with severe headaches. She has a long history of very severe headaches and these she said were slightly different. She has a history of migraines and has been hospitalized for them in the past. Two years ago I believe, she had been hospitalized for two days secondary to very bad headaches, and told her she had a lacunar infarct. When she went to the emergency room, she felt cold, clammy, and sweaty. She states that she has just been under a great deal of stress and she was unsure exactly what was or what was not happening. She had a plain CT and that appeared to show a middle cerebral artery aneurysm and she was then transferred to [facility] after being at, I believe, the 23 facility, underwent an MRI of the brain. With that confirmed that she had a 9.5 mm middle cerebral artery aneurysm, which had not blood. She was then sent to me for further evaluation. She said that she was having emesis at that time had severe pain in her neck and shoulders from this headache and is one of the reason why it felt very different. She has subsequently had some problems with constipation. She is a very anxious woman. She fully understands that she takes trazodone for sleep and takes Xanax for her nerves. She has been taking Zofran, but she felt that was causing stomach problems. She has a condition for which she was taking metoprolol, pre-tachycardia with exertion. REVIEW OF SYSTEMS: Positive for eyeglasses, chest pain, hypertension, arrhythmia, nausea, vomiting, endometriosis, history of seizures when she was a child, memory problems, disorientation, difficulty concentrating, blurred vision, anxiety, and depression. She denies allergic, hematologic, musculoskeletal, genitourinary, or constitutional problems. She just lists numbness and tingling  in the left hand on the pain chart. PAST MEDICAL HISTORY:  Current Medical Conditions: Also includes hypertension. She is right handed.  Prior Operations: She has undergone a laparoscopy in the past.  Medications and Allergies: SHE SAYS THE HEADACHE MEDICATION CAUSE CHEST PAIN AND CODEINE MAKES HER ITCH. Medications: Advil which she stopped taking, nonsteroidals, alprazolam, the aspirin again which she stopped taking, bupropion, hydrocodone, metoprolol, tramadol, and vitamin C. FAMILY HISTORY: Mother and father both deceased. Hypertension and cardiac arrest present in the family history. She started to develop numbness in the left hand. She had blurred vision and felt dizzy also. SOCIAL HISTORY: She does smoke. She does not use alcohol. She does not use illicit drugs. PHYSICAL EXAMINATION: Vital signs: Height 67 inches and weight 196 pounds. BMI is 30.7. Blood pressure is 119/80, pulse 86, and temperature 97.8. Pain today 3/10. On examination, she is alert and oriented x4 and answering all questions appropriately. Memory, language, attention span, and fund of knowledge are normal. She is well kempt and in obvious distress. She has 5/5 strength in the upper and lower extremities. Negative Romberg test. Normal gait. Normal muscle tone, bulk, and coordination. 2+ reflexes in biceps, triceps, brachioradialis, knees, and ankles. Intact proprioception. Pupils are equal, round, and reactive to light. Full extraocular movements. Full visual fields. Symmetric facial sensation and movement. Hearing intact to finger rub bilaterally. Uvula elevates in the midline. Shoulder shrug is normal. Tongue protrudes in the midline. She has no drift on exam. Normal fine motor movement and coordination. DIAGNOSTIC STUDIES: MRI and CT are  reviewed. What they show is a middle cerebral artery aneurysm. Not much more detail was provided other than that. No other abnormalities noted on the circle of Willis or in the posterior  circulation. IMPRESSION/PLAN: I had a very long conversation with Melissa Morgan and her daughter. I explained that a middle cerebral artery aneurysm of this size 9.5 mm should certainly be treated. I do believe a craniotomy would be the best option.risks and benefits were explained, bleeding, infection, stroke, coma, death, seizures, brain damage, paralysis, weakness, change in mentation, change in personality, inability to clip aneurysm, and other risks were explained. She understands and proceeds.

## 2014-07-05 NOTE — Anesthesia Preprocedure Evaluation (Addendum)
Anesthesia Evaluation   Patient awake    Reviewed: Allergy & Precautions, H&P , NPO status , Patient's Chart, lab work & pertinent test results, reviewed documented beta blocker date and time   History of Anesthesia Complications (+) PONV  Airway Mallampati: II TM Distance: >3 FB     Dental  (+) Teeth Intact   Pulmonary Current Smoker (22 pack year hx, current smoker),  breath sounds clear to auscultation        Cardiovascular Rhythm:Regular Rate:Normal     Neuro/Psych  Headaches, Anxiety Depression CVA, bleed 05/17/14 43mm RMCA aneurysm near bifurcation from carotid CVA    GI/Hepatic GERD-  ,  Endo/Other    Renal/GU      Musculoskeletal   Abdominal (+)  Abdomen: soft.    Peds  Hematology   Anesthesia Other Findings   Reproductive/Obstetrics                         Anesthesia Physical Anesthesia Plan  ASA: III  Anesthesia Plan: General   Post-op Pain Management:    Induction: Intravenous  Airway Management Planned: Oral ETT  Additional Equipment: Arterial line and CVP  Intra-op Plan:   Post-operative Plan: Extubation in OR  Informed Consent: I have reviewed the patients History and Physical, chart, labs and discussed the procedure including the risks, benefits and alternatives for the proposed anesthesia with the patient or authorized representative who has indicated his/her understanding and acceptance.     Plan Discussed with:   Anesthesia Plan Comments:         Anesthesia Quick Evaluation

## 2014-07-06 ENCOUNTER — Inpatient Hospital Stay (HOSPITAL_COMMUNITY)
Admission: RE | Admit: 2014-07-06 | Discharge: 2014-07-11 | DRG: 027 | Disposition: A | Discharge status: Home / Self Care | Payer: BC Managed Care – PPO | Source: Ambulatory Visit | Attending: Neurosurgery | Admitting: Neurosurgery

## 2014-07-06 ENCOUNTER — Encounter (HOSPITAL_COMMUNITY): Payer: BC Managed Care – PPO | Admitting: Anesthesiology

## 2014-07-06 ENCOUNTER — Encounter (HOSPITAL_COMMUNITY)
Admission: RE | Disposition: A | Discharge status: Home / Self Care | Payer: Self-pay | Source: Ambulatory Visit | Attending: Neurosurgery

## 2014-07-06 ENCOUNTER — Inpatient Hospital Stay (HOSPITAL_COMMUNITY): Payer: BC Managed Care – PPO | Admitting: Anesthesiology

## 2014-07-06 ENCOUNTER — Inpatient Hospital Stay (HOSPITAL_COMMUNITY): Payer: BC Managed Care – PPO

## 2014-07-06 ENCOUNTER — Encounter (HOSPITAL_COMMUNITY): Payer: Self-pay | Admitting: *Deleted

## 2014-07-06 DIAGNOSIS — I671 Cerebral aneurysm, nonruptured: Principal | ICD-10-CM | POA: Diagnosis present

## 2014-07-06 DIAGNOSIS — F17200 Nicotine dependence, unspecified, uncomplicated: Secondary | ICD-10-CM | POA: Diagnosis present

## 2014-07-06 DIAGNOSIS — I729 Aneurysm of unspecified site: Secondary | ICD-10-CM | POA: Diagnosis present

## 2014-07-06 DIAGNOSIS — K219 Gastro-esophageal reflux disease without esophagitis: Secondary | ICD-10-CM | POA: Diagnosis present

## 2014-07-06 DIAGNOSIS — I10 Essential (primary) hypertension: Secondary | ICD-10-CM | POA: Diagnosis present

## 2014-07-06 DIAGNOSIS — Z8673 Personal history of transient ischemic attack (TIA), and cerebral infarction without residual deficits: Secondary | ICD-10-CM | POA: Diagnosis not present

## 2014-07-06 HISTORY — PX: CRANIOTOMY: SHX93

## 2014-07-06 LAB — POCT I-STAT 7, (LYTES, BLD GAS, ICA,H+H)
Bicarbonate: 26.7 mEq/L — ABNORMAL HIGH (ref 20.0–24.0)
CALCIUM ION: 1.21 mmol/L (ref 1.12–1.23)
HEMATOCRIT: 32 % — AB (ref 36.0–46.0)
HEMOGLOBIN: 10.9 g/dL — AB (ref 12.0–15.0)
O2 Saturation: 100 %
PCO2 ART: 49.9 mmHg — AB (ref 35.0–45.0)
PH ART: 7.337 — AB (ref 7.350–7.450)
PO2 ART: 502 mmHg — AB (ref 80.0–100.0)
Potassium: 3.4 mEq/L — ABNORMAL LOW (ref 3.7–5.3)
Sodium: 138 mEq/L (ref 137–147)
TCO2: 28 mmol/L (ref 0–100)

## 2014-07-06 LAB — PREPARE RBC (CROSSMATCH)

## 2014-07-06 SURGERY — CRANIOTOMY INTRACRANIAL ANEURYSM FOR CAROTID
Anesthesia: General | Site: Head | Laterality: Right

## 2014-07-06 MED ORDER — SCOPOLAMINE 1 MG/3DAYS TD PT72
MEDICATED_PATCH | TRANSDERMAL | Status: AC
  Administered 2014-07-06: 1 via TRANSDERMAL
  Filled 2014-07-06: qty 1

## 2014-07-06 MED ORDER — DEXAMETHASONE 4 MG PO TABS
4.0000 mg | ORAL_TABLET | Freq: Four times a day (QID) | ORAL | Status: AC
  Administered 2014-07-06 – 2014-07-07 (×2): 4 mg via ORAL
  Filled 2014-07-06 (×2): qty 1

## 2014-07-06 MED ORDER — ONDANSETRON HCL 4 MG/2ML IJ SOLN
INTRAMUSCULAR | Status: AC
  Filled 2014-07-06: qty 2

## 2014-07-06 MED ORDER — POTASSIUM CHLORIDE IN NACL 20-0.9 MEQ/L-% IV SOLN
INTRAVENOUS | Status: DC
  Administered 2014-07-06 – 2014-07-07 (×3): via INTRAVENOUS
  Administered 2014-07-07: 1000 mL via INTRAVENOUS
  Administered 2014-07-08: 13:00:00 via INTRAVENOUS
  Administered 2014-07-09: 1000 mL via INTRAVENOUS
  Administered 2014-07-09: 21:00:00 via INTRAVENOUS
  Filled 2014-07-06 (×9): qty 1000

## 2014-07-06 MED ORDER — ROCURONIUM BROMIDE 100 MG/10ML IV SOLN
INTRAVENOUS | Status: DC | PRN
  Administered 2014-07-06: 20 mg via INTRAVENOUS
  Administered 2014-07-06: 50 mg via INTRAVENOUS
  Administered 2014-07-06 (×2): 10 mg via INTRAVENOUS
  Administered 2014-07-06: 50 mg via INTRAVENOUS
  Administered 2014-07-06: 20 mg via INTRAVENOUS
  Administered 2014-07-06: 10 mg via INTRAVENOUS

## 2014-07-06 MED ORDER — HYDROMORPHONE HCL PF 1 MG/ML IJ SOLN
1.0000 mg | INTRAMUSCULAR | Status: DC | PRN
Start: 2014-07-06 — End: 2014-07-11
  Administered 2014-07-06 – 2014-07-07 (×4): 1 mg via INTRAVENOUS
  Filled 2014-07-06 (×5): qty 1

## 2014-07-06 MED ORDER — DEXAMETHASONE 4 MG PO TABS
4.0000 mg | ORAL_TABLET | Freq: Three times a day (TID) | ORAL | Status: AC
  Administered 2014-07-07 – 2014-07-08 (×2): 4 mg via ORAL
  Filled 2014-07-06 (×5): qty 1

## 2014-07-06 MED ORDER — PROMETHAZINE HCL 25 MG PO TABS
12.5000 mg | ORAL_TABLET | ORAL | Status: DC | PRN
  Administered 2014-07-08: 25 mg via ORAL
  Filled 2014-07-06: qty 1

## 2014-07-06 MED ORDER — OXYCODONE HCL 5 MG PO TABS
5.0000 mg | ORAL_TABLET | ORAL | Status: DC | PRN
  Administered 2014-07-06 – 2014-07-10 (×16): 10 mg via ORAL
  Administered 2014-07-11 (×3): 5 mg via ORAL
  Administered 2014-07-11: 10 mg via ORAL
  Filled 2014-07-06 (×5): qty 2
  Filled 2014-07-06: qty 1
  Filled 2014-07-06 (×10): qty 2
  Filled 2014-07-06: qty 1
  Filled 2014-07-06 (×3): qty 2

## 2014-07-06 MED ORDER — SENNA 8.6 MG PO TABS
1.0000 | ORAL_TABLET | Freq: Two times a day (BID) | ORAL | Status: DC
  Administered 2014-07-06 – 2014-07-11 (×10): 8.6 mg via ORAL
  Filled 2014-07-06 (×11): qty 1

## 2014-07-06 MED ORDER — PHENYLEPHRINE HCL 10 MG/ML IJ SOLN
10.0000 mg | INTRAVENOUS | Status: DC | PRN
  Administered 2014-07-06: 5 ug/min via INTRAVENOUS

## 2014-07-06 MED ORDER — HYDROCODONE-ACETAMINOPHEN 5-325 MG PO TABS
1.0000 | ORAL_TABLET | ORAL | Status: DC | PRN
  Administered 2014-07-09 – 2014-07-11 (×7): 1 via ORAL
  Filled 2014-07-06 (×7): qty 1

## 2014-07-06 MED ORDER — MIDAZOLAM HCL 2 MG/2ML IJ SOLN
INTRAMUSCULAR | Status: AC
  Filled 2014-07-06: qty 2

## 2014-07-06 MED ORDER — ALPRAZOLAM 0.5 MG PO TABS: 0.5000 mg | ORAL_TABLET | Freq: Two times a day (BID) | ORAL | Status: DC | PRN

## 2014-07-06 MED ORDER — LIDOCAINE HCL (PF) 1 % IJ SOLN
INTRAMUSCULAR | Status: DC | PRN
  Administered 2014-07-06: 10 mL

## 2014-07-06 MED ORDER — NALOXONE HCL 0.4 MG/ML IJ SOLN
0.0800 mg | INTRAMUSCULAR | Status: DC | PRN
Start: 2014-07-06 — End: 2014-07-11

## 2014-07-06 MED ORDER — NEOSTIGMINE METHYLSULFATE 10 MG/10ML IV SOLN
INTRAVENOUS | Status: AC
  Filled 2014-07-06: qty 1

## 2014-07-06 MED ORDER — BACITRACIN ZINC 500 UNIT/GM EX OINT
TOPICAL_OINTMENT | CUTANEOUS | Status: DC | PRN
  Administered 2014-07-06: 1 via TOPICAL

## 2014-07-06 MED ORDER — ONDANSETRON HCL 4 MG/2ML IJ SOLN
INTRAMUSCULAR | Status: DC | PRN
  Administered 2014-07-06: 4 mg via INTRAVENOUS

## 2014-07-06 MED ORDER — DEXAMETHASONE SODIUM PHOSPHATE 10 MG/ML IJ SOLN
INTRAMUSCULAR | Status: AC
  Filled 2014-07-06: qty 1

## 2014-07-06 MED ORDER — FENTANYL CITRATE 0.05 MG/ML IJ SOLN
25.0000 ug | INTRAMUSCULAR | Status: DC | PRN
  Administered 2014-07-06: 0.5 ug via INTRAVENOUS

## 2014-07-06 MED ORDER — ROCURONIUM BROMIDE 50 MG/5ML IV SOLN
INTRAVENOUS | Status: AC
  Filled 2014-07-06: qty 2

## 2014-07-06 MED ORDER — PROPOFOL 10 MG/ML IV BOLUS
INTRAVENOUS | Status: DC | PRN
  Administered 2014-07-06: 170 mg via INTRAVENOUS

## 2014-07-06 MED ORDER — DEXAMETHASONE 4 MG PO TABS
4.0000 mg | ORAL_TABLET | Freq: Three times a day (TID) | ORAL | Status: DC
  Administered 2014-07-07 – 2014-07-09 (×5): 4 mg via ORAL
  Filled 2014-07-06 (×5): qty 1

## 2014-07-06 MED ORDER — SODIUM CHLORIDE 0.9 % IJ SOLN
INTRAMUSCULAR | Status: AC
  Filled 2014-07-06: qty 10

## 2014-07-06 MED ORDER — METOPROLOL TARTRATE 25 MG PO TABS
25.0000 mg | ORAL_TABLET | Freq: Every day | ORAL | Status: DC
  Administered 2014-07-08 – 2014-07-11 (×3): 25 mg via ORAL
  Filled 2014-07-06 (×6): qty 1

## 2014-07-06 MED ORDER — POLYETHYLENE GLYCOL 3350 17 G PO PACK
17.0000 g | PACK | Freq: Every day | ORAL | Status: DC | PRN
  Administered 2014-07-09: 17 g via ORAL
  Filled 2014-07-06 (×3): qty 1

## 2014-07-06 MED ORDER — 0.9 % SODIUM CHLORIDE (POUR BTL) OPTIME
TOPICAL | Status: DC | PRN
  Administered 2014-07-06: 2000 mL

## 2014-07-06 MED ORDER — NEOSTIGMINE METHYLSULFATE 10 MG/10ML IV SOLN
INTRAVENOUS | Status: DC | PRN
  Administered 2014-07-06: 3 mg via INTRAVENOUS

## 2014-07-06 MED ORDER — HEMOSTATIC AGENTS (NO CHARGE) OPTIME
TOPICAL | Status: DC | PRN
  Administered 2014-07-06: 1 via TOPICAL

## 2014-07-06 MED ORDER — GLYCOPYRROLATE 0.2 MG/ML IJ SOLN
INTRAMUSCULAR | Status: AC
  Filled 2014-07-06: qty 3

## 2014-07-06 MED ORDER — PROPOFOL 10 MG/ML IV BOLUS
INTRAVENOUS | Status: AC
  Filled 2014-07-06: qty 20

## 2014-07-06 MED ORDER — SODIUM CHLORIDE 0.9 % IV SOLN
500.0000 mg | INTRAVENOUS | Status: DC | PRN
  Administered 2014-07-06: 500 mg via INTRAVENOUS

## 2014-07-06 MED ORDER — PANTOPRAZOLE SODIUM 40 MG IV SOLR
40.0000 mg | Freq: Every day | INTRAVENOUS | Status: DC
  Administered 2014-07-06 – 2014-07-07 (×2): 40 mg via INTRAVENOUS
  Filled 2014-07-06 (×4): qty 40

## 2014-07-06 MED ORDER — POTASSIUM CHLORIDE 10 MEQ/100ML IV SOLN
10.0000 meq | INTRAVENOUS | Status: AC
  Administered 2014-07-06 (×2): 10 meq via INTRAVENOUS
  Filled 2014-07-06 (×2): qty 100

## 2014-07-06 MED ORDER — MIDAZOLAM HCL 5 MG/5ML IJ SOLN
INTRAMUSCULAR | Status: DC | PRN
  Administered 2014-07-06: 2 mg via INTRAVENOUS

## 2014-07-06 MED ORDER — LABETALOL HCL 5 MG/ML IV SOLN: 10.0000 mg | INTRAVENOUS | Status: DC | PRN

## 2014-07-06 MED ORDER — MEPERIDINE HCL 25 MG/ML IJ SOLN: 6.2500 mg | INTRAMUSCULAR | Status: DC | PRN

## 2014-07-06 MED ORDER — MORPHINE SULFATE 2 MG/ML IJ SOLN
1.0000 mg | INTRAMUSCULAR | Status: DC | PRN
  Administered 2014-07-06 – 2014-07-07 (×2): 2 mg via INTRAVENOUS
  Filled 2014-07-06 (×2): qty 1

## 2014-07-06 MED ORDER — TRAZODONE HCL 50 MG PO TABS
50.0000 mg | ORAL_TABLET | Freq: Every day | ORAL | Status: DC
  Administered 2014-07-07 – 2014-07-10 (×4): 50 mg via ORAL
  Filled 2014-07-06 (×6): qty 1

## 2014-07-06 MED ORDER — SUFENTANIL CITRATE 50 MCG/ML IV SOLN
INTRAVENOUS | Status: DC | PRN
  Administered 2014-07-06 (×2): 5 ug via INTRAVENOUS
  Administered 2014-07-06: 10 ug via INTRAVENOUS
  Administered 2014-07-06: 5 ug via INTRAVENOUS
  Administered 2014-07-06: 15 ug via INTRAVENOUS
  Administered 2014-07-06: 50 ug via INTRAVENOUS
  Administered 2014-07-06: 10 ug via INTRAVENOUS

## 2014-07-06 MED ORDER — INDOCYANINE GREEN 25 MG IV SOLR
5.0000 mg | INTRAVENOUS | Status: DC
  Filled 2014-07-06: qty 25

## 2014-07-06 MED ORDER — ARTIFICIAL TEARS OP OINT
TOPICAL_OINTMENT | OPHTHALMIC | Status: DC | PRN
  Administered 2014-07-06: 1 via OPHTHALMIC

## 2014-07-06 MED ORDER — CETYLPYRIDINIUM CHLORIDE 0.05 % MT LIQD
7.0000 mL | Freq: Two times a day (BID) | OROMUCOSAL | Status: DC
  Administered 2014-07-07 – 2014-07-11 (×6): 7 mL via OROMUCOSAL

## 2014-07-06 MED ORDER — SUFENTANIL CITRATE 50 MCG/ML IV SOLN
INTRAVENOUS | Status: AC
  Filled 2014-07-06: qty 1

## 2014-07-06 MED ORDER — ROCURONIUM BROMIDE 50 MG/5ML IV SOLN
INTRAVENOUS | Status: AC
Start: 2014-07-06 — End: 2014-07-06
  Filled 2014-07-06: qty 2

## 2014-07-06 MED ORDER — THROMBIN 20000 UNITS EX KIT
PACK | CUTANEOUS | Status: DC | PRN
  Administered 2014-07-06: 20000 [IU] via TOPICAL

## 2014-07-06 MED ORDER — SODIUM CHLORIDE 0.9 % IV SOLN
INTRAVENOUS | Status: DC | PRN
  Administered 2014-07-06: 07:00:00 via INTRAVENOUS

## 2014-07-06 MED ORDER — LABETALOL HCL 5 MG/ML IV SOLN
INTRAVENOUS | Status: DC | PRN
  Administered 2014-07-06: 5 mg via INTRAVENOUS

## 2014-07-06 MED ORDER — BUPROPION HCL ER (XL) 150 MG PO TB24
300.0000 mg | ORAL_TABLET | Freq: Every day | ORAL | Status: DC
  Administered 2014-07-06 – 2014-07-11 (×6): 300 mg via ORAL
  Filled 2014-07-06: qty 2
  Filled 2014-07-06: qty 1
  Filled 2014-07-06 (×2): qty 2
  Filled 2014-07-06 (×2): qty 1

## 2014-07-06 MED ORDER — FENTANYL CITRATE 0.05 MG/ML IJ SOLN
INTRAMUSCULAR | Status: AC
  Filled 2014-07-06: qty 2

## 2014-07-06 MED ORDER — PROMETHAZINE HCL 25 MG/ML IJ SOLN: 6.2500 mg | INTRAMUSCULAR | Status: DC | PRN

## 2014-07-06 MED ORDER — MICROFIBRILLAR COLL HEMOSTAT EX PADS
MEDICATED_PAD | CUTANEOUS | Status: DC | PRN
  Administered 2014-07-06: 1 via TOPICAL

## 2014-07-06 MED ORDER — MICROFIBRILLAR COLL HEMOSTAT EX PADS: MEDICATED_PAD | CUTANEOUS | Status: DC | PRN

## 2014-07-06 MED ORDER — INDOCYANINE GREEN 25 MG IV SOLR
5.0000 mg | Freq: Once | INTRAVENOUS | Status: AC
  Administered 2014-07-06 (×2): 25 mg via TOPICAL
  Filled 2014-07-06 (×2): qty 25

## 2014-07-06 MED ORDER — LEVETIRACETAM IN NACL 500 MG/100ML IV SOLN
500.0000 mg | Freq: Two times a day (BID) | INTRAVENOUS | Status: DC
Start: 2014-07-06 — End: 2014-07-09
  Administered 2014-07-06 – 2014-07-09 (×7): 500 mg via INTRAVENOUS
  Filled 2014-07-06 (×11): qty 100

## 2014-07-06 MED ORDER — SODIUM CHLORIDE 0.9 % IV SOLN
INTRAVENOUS | Status: DC | PRN
  Administered 2014-07-06 (×2): via INTRAVENOUS

## 2014-07-06 MED ORDER — DEXAMETHASONE SODIUM PHOSPHATE 10 MG/ML IJ SOLN
INTRAMUSCULAR | Status: DC | PRN
  Administered 2014-07-06: 10 mg via INTRAVENOUS

## 2014-07-06 MED ORDER — LIDOCAINE HCL (CARDIAC) 20 MG/ML IV SOLN
INTRAVENOUS | Status: AC
  Filled 2014-07-06: qty 5

## 2014-07-06 MED ORDER — BISACODYL 5 MG PO TBEC
5.0000 mg | DELAYED_RELEASE_TABLET | Freq: Every day | ORAL | Status: DC | PRN
  Administered 2014-07-09 – 2014-07-10 (×2): 5 mg via ORAL
  Filled 2014-07-06 (×3): qty 1

## 2014-07-06 MED ORDER — LEVETIRACETAM IN NACL 500 MG/100ML IV SOLN
500.0000 mg | INTRAVENOUS | Status: DC
  Filled 2014-07-06: qty 100

## 2014-07-06 MED ORDER — SODIUM CHLORIDE 0.9 % IV SOLN
INTRAVENOUS | Status: DC | PRN
  Administered 2014-07-06 (×3): via INTRAVENOUS

## 2014-07-06 MED ORDER — GLYCOPYRROLATE 0.2 MG/ML IJ SOLN
INTRAMUSCULAR | Status: DC | PRN
  Administered 2014-07-06: .4 mg via INTRAVENOUS

## 2014-07-06 MED ORDER — ONDANSETRON HCL 4 MG/2ML IJ SOLN
4.0000 mg | INTRAMUSCULAR | Status: DC | PRN
  Administered 2014-07-06 – 2014-07-08 (×6): 4 mg via INTRAVENOUS
  Filled 2014-07-06 (×7): qty 2

## 2014-07-06 MED ORDER — ARTIFICIAL TEARS OP OINT
TOPICAL_OINTMENT | OPHTHALMIC | Status: AC
  Filled 2014-07-06: qty 3.5

## 2014-07-06 MED ORDER — MAGNESIUM CITRATE PO SOLN
1.0000 | Freq: Once | ORAL | Status: AC | PRN
  Filled 2014-07-06: qty 296

## 2014-07-06 MED ORDER — ONDANSETRON HCL 4 MG PO TABS
4.0000 mg | ORAL_TABLET | ORAL | Status: DC | PRN
  Administered 2014-07-08 – 2014-07-11 (×5): 4 mg via ORAL
  Filled 2014-07-06 (×5): qty 1

## 2014-07-06 SURGICAL SUPPLY — 114 items
AM-24-S SIDECUTTING ARACHNOID KNIFE ×2 IMPLANT
ANEURYSM CLIP MINI ×2 IMPLANT
BANDAGE GAUZE 4  KLING STR (GAUZE/BANDAGES/DRESSINGS) ×2 IMPLANT
BENZOIN TINCTURE PRP APPL 2/3 (GAUZE/BANDAGES/DRESSINGS) IMPLANT
BIT DRILL WIRE PASS 1.3MM (BIT) IMPLANT
BLADE EYE SICKLE 84 5 BEAV (BLADE) IMPLANT
BLADE SAW GIGLI 16 STRL (MISCELLANEOUS) IMPLANT
BLADE SURG 10 STRL SS (BLADE) ×4 IMPLANT
BLADE ULTRA TIP 2M (BLADE) ×2 IMPLANT
BNDG GAUZE ELAST 4 BULKY (GAUZE/BANDAGES/DRESSINGS) ×4 IMPLANT
BRUSH SCRUB EZ 1% IODOPHOR (MISCELLANEOUS) IMPLANT
BRUSH SCRUB EZ PLAIN DRY (MISCELLANEOUS) IMPLANT
BUR ACORN 6.0 PRECISION (BURR) ×2 IMPLANT
BUR ADDG 1.1 (BURR) IMPLANT
BUR MATCHSTICK NEURO 3.0 LAGG (BURR) IMPLANT
BUR ROUTER D-58 CRANI (BURR) ×2 IMPLANT
CANISTER SUCT 3000ML (MISCELLANEOUS) ×6 IMPLANT
CLIP ANEURY PERM STD 8.3MM (Clip) ×2 IMPLANT
CLIP ANEURY TI PERM 5.0 STD 7.5mm ×2 IMPLANT
CLIP ANEURY TI PERM 5.0STD7.5M (Clip) ×2 IMPLANT
CLIP ANEURY TI PERM STD STR 7M (Clip) ×4 IMPLANT
CLIP ANEURY TI PERM STD STR 7mm ×4 IMPLANT
CLIP ANEURY TIP ERM 5 STD 7.5 (INSTRUMENTS) ×1 IMPLANT
CLIP TI MEDIUM 6 (CLIP) IMPLANT
CLIPANEURYTIPERM5.0STD7.5MRTAN (INSTRUMENTS) ×1
CLIPANEUTIPERM 5.0 STD7.5MMRTANG ×2 IMPLANT
CONT SPEC 4OZ CLIKSEAL STRL BL (MISCELLANEOUS) ×2 IMPLANT
CORDS BIPOLAR (ELECTRODE) IMPLANT
DECANTER SPIKE VIAL GLASS SM (MISCELLANEOUS) IMPLANT
DRAIN SNY WOU 7FLT (WOUND CARE) IMPLANT
DRAPE MICROSCOPE LEICA (MISCELLANEOUS) ×2 IMPLANT
DRAPE NEUROLOGICAL W/INCISE (DRAPES) ×2 IMPLANT
DRAPE WARM FLUID 44X44 (DRAPE) ×2 IMPLANT
DRILL WIRE PASS 1.3MM (BIT)
DRSG TELFA 3X8 NADH (GAUZE/BANDAGES/DRESSINGS) IMPLANT
DURAMATRIX ONLAY 3X3 (Plate) ×2 IMPLANT
DURAPREP 6ML APPLICATOR 50/CS (WOUND CARE) ×2 IMPLANT
ELECT CAUTERY BLADE 6.4 (BLADE) ×2 IMPLANT
ELECT REM PT RETURN 9FT ADLT (ELECTROSURGICAL) ×2
ELECTRODE REM PT RTRN 9FT ADLT (ELECTROSURGICAL) ×1 IMPLANT
EVACUATOR SILICONE 100CC (DRAIN) IMPLANT
GAUZE SPONGE 4X4 12PLY STRL (GAUZE/BANDAGES/DRESSINGS) ×2 IMPLANT
GAUZE SPONGE 4X4 16PLY XRAY LF (GAUZE/BANDAGES/DRESSINGS) IMPLANT
GLOVE BIO SURGEON STRL SZ 6.5 (GLOVE) IMPLANT
GLOVE BIO SURGEON STRL SZ7 (GLOVE) IMPLANT
GLOVE BIO SURGEON STRL SZ7.5 (GLOVE) IMPLANT
GLOVE BIO SURGEON STRL SZ8 (GLOVE) IMPLANT
GLOVE BIO SURGEON STRL SZ8.5 (GLOVE) IMPLANT
GLOVE BIOGEL M 8.0 STRL (GLOVE) IMPLANT
GLOVE BIOGEL PI IND STRL 7.0 (GLOVE) ×5 IMPLANT
GLOVE BIOGEL PI IND STRL 7.5 (GLOVE) ×1 IMPLANT
GLOVE BIOGEL PI INDICATOR 7.0 (GLOVE) ×5
GLOVE BIOGEL PI INDICATOR 7.5 (GLOVE) ×1
GLOVE ECLIPSE 6.5 STRL STRAW (GLOVE) IMPLANT
GLOVE ECLIPSE 7.0 STRL STRAW (GLOVE) ×2 IMPLANT
GLOVE ECLIPSE 7.5 STRL STRAW (GLOVE) IMPLANT
GLOVE ECLIPSE 8.0 STRL XLNG CF (GLOVE) IMPLANT
GLOVE ECLIPSE 8.5 STRL (GLOVE) IMPLANT
GLOVE EXAM NITRILE LRG STRL (GLOVE) IMPLANT
GLOVE EXAM NITRILE MD LF STRL (GLOVE) IMPLANT
GLOVE EXAM NITRILE XL STR (GLOVE) IMPLANT
GLOVE EXAM NITRILE XS STR PU (GLOVE) IMPLANT
GLOVE INDICATOR 6.5 STRL GRN (GLOVE) IMPLANT
GLOVE INDICATOR 7.0 STRL GRN (GLOVE) IMPLANT
GLOVE INDICATOR 7.5 STRL GRN (GLOVE) ×2 IMPLANT
GLOVE INDICATOR 8.0 STRL GRN (GLOVE) IMPLANT
GLOVE INDICATOR 8.5 STRL (GLOVE) IMPLANT
GLOVE OPTIFIT SS 8.0 STRL (GLOVE) IMPLANT
GLOVE SURG SS PI 6.5 STRL IVOR (GLOVE) ×2 IMPLANT
GLOVE SURG SS PI 7.0 STRL IVOR (GLOVE) ×8 IMPLANT
GOWN STRL REUS W/ TWL LRG LVL3 (GOWN DISPOSABLE) ×2 IMPLANT
GOWN STRL REUS W/ TWL XL LVL3 (GOWN DISPOSABLE) ×1 IMPLANT
GOWN STRL REUS W/TWL 2XL LVL3 (GOWN DISPOSABLE) IMPLANT
GOWN STRL REUS W/TWL LRG LVL3 (GOWN DISPOSABLE) ×2
GOWN STRL REUS W/TWL XL LVL3 (GOWN DISPOSABLE) ×1
HEMOSTAT SURGICEL 2X14 (HEMOSTASIS) ×2 IMPLANT
HOOK DURA (MISCELLANEOUS) IMPLANT
KIT BASIN OR (CUSTOM PROCEDURE TRAY) ×2 IMPLANT
KIT DRAIN CSF ACCUDRAIN (MISCELLANEOUS) IMPLANT
KIT ROOM TURNOVER OR (KITS) ×2 IMPLANT
NEEDLE HYPO 25X1 1.5 SAFETY (NEEDLE) ×2 IMPLANT
NS IRRIG 1000ML POUR BTL (IV SOLUTION) ×4 IMPLANT
PACK CRANIOTOMY (CUSTOM PROCEDURE TRAY) ×2 IMPLANT
PAD ARMBOARD 7.5X6 YLW CONV (MISCELLANEOUS) ×8 IMPLANT
PATTIES SURGICAL .25X.25 (GAUZE/BANDAGES/DRESSINGS) IMPLANT
PATTIES SURGICAL .5 X.5 (GAUZE/BANDAGES/DRESSINGS) ×2 IMPLANT
PATTIES SURGICAL .5 X3 (DISPOSABLE) IMPLANT
PATTIES SURGICAL 1/4 X 3 (GAUZE/BANDAGES/DRESSINGS) IMPLANT
PATTIES SURGICAL 1X1 (DISPOSABLE) ×2 IMPLANT
PIN MAYFIELD SKULL DISP (PIN) ×2 IMPLANT
PLATE 1.5  2HOLE LNG NEURO (Plate) ×3 IMPLANT
PLATE 1.5  2HOLE MED NEURO (Plate) ×2 IMPLANT
PLATE 1.5 2HOLE LNG NEURO (Plate) ×3 IMPLANT
PLATE 1.5 2HOLE MED NEURO (Plate) ×2 IMPLANT
RUBBERBAND STERILE (MISCELLANEOUS) ×4 IMPLANT
SCREW SELF DRILL HT 1.5/4MM (Screw) ×20 IMPLANT
SPONGE NEURO XRAY DETECT 1X3 (DISPOSABLE) IMPLANT
SPONGE SURGIFOAM ABS GEL 100 (HEMOSTASIS) ×2 IMPLANT
SPONGE SURGIFOAM ABS GEL 100C (HEMOSTASIS) ×2 IMPLANT
STAPLER SKIN PROX WIDE 3.9 (STAPLE) ×2 IMPLANT
SUT ETHILON 3 0 FSL (SUTURE) IMPLANT
SUT NURALON 4 0 TR CR/8 (SUTURE) ×4 IMPLANT
SUT VIC AB 2-0 CT2 18 VCP726D (SUTURE) ×8 IMPLANT
SUT VIC AB 3-0 SH 8-18 (SUTURE) IMPLANT
SYR 20ML ECCENTRIC (SYRINGE) IMPLANT
SYR CONTROL 10ML LL (SYRINGE) IMPLANT
SYR INSULIN 1ML 31GX6 SAFETY (SYRINGE) ×2 IMPLANT
TIP NONSTICK .5MMX23CM (INSTRUMENTS) ×1
TIP NONSTICK .5X23 (INSTRUMENTS) ×1 IMPLANT
TOWEL OR 17X24 6PK STRL BLUE (TOWEL DISPOSABLE) ×2 IMPLANT
TOWEL OR 17X26 10 PK STRL BLUE (TOWEL DISPOSABLE) ×2 IMPLANT
TRAY FOLEY CATH 14FRSI W/METER (CATHETERS) IMPLANT
UNDERPAD 30X30 INCONTINENT (UNDERPADS AND DIAPERS) IMPLANT
WATER STERILE IRR 1000ML POUR (IV SOLUTION) ×2 IMPLANT

## 2014-07-06 NOTE — Anesthesia Postprocedure Evaluation (Signed)
  Anesthesia Post-op Note  Patient: Melissa Morgan  Procedure(s) Performed: Procedure(s) with comments: CRANIOTOMY INTRACRANIAL ANEURYSM FOR CAROTID (Right) - right   Patient Location: PACU  Anesthesia Type:General  Level of Consciousness: oriented, sedated, patient cooperative and lethargic  Airway and Oxygen Therapy: Patient Spontanous Breathing and Patient connected to nasal cannula oxygen  Post-op Pain: mild  Post-op Assessment: Post-op Vital signs reviewed, Patient's Cardiovascular Status Stable, Respiratory Function Stable and No signs of Nausea or vomiting  Post-op Vital Signs: Reviewed and stable  Last Vitals:  Filed Vitals:   07/06/14 1315  BP:   Pulse: 67  Temp:   Resp: 20    Complications: No apparent anesthesia complications

## 2014-07-06 NOTE — Transfer of Care (Signed)
Immediate Anesthesia Transfer of Care Note  Patient: Melissa Morgan  Procedure(s) Performed: Procedure(s) with comments: CRANIOTOMY INTRACRANIAL ANEURYSM FOR CAROTID (Right) - right   Patient Location: PACU  Anesthesia Type:General  Level of Consciousness: awake and sedated  Airway & Oxygen Therapy: Patient Spontanous Breathing and Patient connected to face mask oxygen  Post-op Assessment: Post -op Vital signs reviewed and stable, Patient moving all extremities and Patient moving all extremities X 4  Post vital signs: Reviewed and stable  Complications: No apparent anesthesia complications

## 2014-07-06 NOTE — Anesthesia Procedure Notes (Addendum)
Anesthesia Procedure Note R 16cm double lumen subclavian placed with sterile technique.  First pass access, sedated with versed 2mg  and fentanyl 163mcg.  N0 complications.  Ectopy with wire.  Positive venous blood asp in both lumens. Procedure Name: Intubation Date/Time: 07/06/2014 7:45 AM Performed by: Izora Gala Pre-anesthesia Checklist: Patient identified, Emergency Drugs available, Suction available and Patient being monitored Patient Re-evaluated:Patient Re-evaluated prior to inductionOxygen Delivery Method: Circle system utilized Preoxygenation: Pre-oxygenation with 100% oxygen Intubation Type: IV induction Ventilation: Mask ventilation without difficulty Laryngoscope Size: Miller and 3 Grade View: Grade I Tube type: Oral Number of attempts: 1 Airway Equipment and Method: Stylet Placement Confirmation: ETT inserted through vocal cords under direct vision,  positive ETCO2 and breath sounds checked- equal and bilateral Secured at: 22 cm Tube secured with: Tape Dental Injury: Teeth and Oropharynx as per pre-operative assessment    Anesthesia Procedure Note R 16cm double lumen subclavian placed with sterile technique.  First pass access, sedated with versed 2mg  and fentanyl 141mcg.  N0 complications.  Ectopy with wire.  Positive venous blood asp in both lumens. Anesthesia Procedure Note R 16cm double lumen subclavian placed with sterile technique.  First pass access, sedated with versed 2mg  and fentanyl 130mcg.  N0 complications.  Ectopy with wire.  Positive venous blood asp in both lumens.

## 2014-07-06 NOTE — Op Note (Signed)
07/06/2014  1:15 PM  PATIENT:  Melissa Morgan  48 y.o. female who presents with an unruptured right middle cerebral artery aneurysm. I believe it is best treated with direct clipping.   PRE-OPERATIVE DIAGNOSIS:  Aneurysm right middle cerebral, unruptured  POST-OPERATIVE DIAGNOSIS:  Aneurysm right middle cerebral, unruptured  PROCEDURE:  Procedure(s): CRANIOTOMY INTRACRANIAL ANEURYSM clipping  SURGEON:  Surgeon(s): Ashok Pall, MD Consuella Lose, MD  ASSISTANTS:Nundkumar, Nena Polio  ANESTHESIA:   general  EBL:  Total I/O In: 2400 [I.V.:2400] Out: 800 [Urine:500; Blood:300]  BLOOD ADMINISTERED:none  CELL SAVER GIVEN:none  COUNT:per nursing  DRAINS: none   SPECIMEN:  No Specimen  DICTATION: Melissa Morgan is a 48 y.o. female Whom was taken to the operating room intubated, and placed under a general anesthetic without difficulty. A foley catheter was placed under sterile conditions without difficulty. I placed a 3 pin mayfield head holder  After adequate anesthesia was obtained. She was positioned with her malar eminence on the right side as the highest point.Marland Kitchen Her head was shaved and prepped in a sterile manner. I made a standard incision for a pterional approach on the right side. I infiltrated the scalp with lidocaine prior to the incision. I created a scalp flap and reflected it anteriorly. I then used the monopolar cautery to create a flap of the temporalis muscle reflecting it posterolaterally. This exposed the pterion, frontal, and temporal bone. I drilled a burr hole, then used the craniotome to create a bone flap. I removed the bone flap and observed that the dura was torn caudally, but the brain was fine. I with Dr. Kathyrn Sheriff, then reflected the dura rostrally. I used the drill to take down the sphenoid wing and create a flat opening between the middle and frontal fossa's. We brought the microscope into the field, and easily dissected the sylvian fissure exposing the aneurysm,  right middle cerebral artery, and the carotid artery. We placed one retractor on the temporal lobe to further expose the aneurysm.  With the aneurysm in view, we first identified the M2 branches on either side of the aneurysmal neck. We then did further dissection on the aneurysm freeing it from the insula, and dissecting M3 branches from the dome. We did this with microdissection, and once we were done, we had a much better view of the neck, and the dome. We had tried placing a clip earlier in the dissection but were not at all satisfied with the clip. We placed a temporary clip on the Right MCA, then placed a fenestrated clip across the aneurysm neck. There was a clear dog ear on the aneurysm so we placed another clip on the side of the fenestration. We did an icg angio and saw that a small portion of the anuerysm still filled. We then regrouped and removed the last clip placed. We then stacked another fenestrated clip on top of the first clip. We then placed two more straight clips across the dome. This was done over time, at no time did we temporarily clip the right mca for more than 5 minutes, and the other 4 occasions for less than 47minutes. She was revascularized for greater than 8 minutes between temporarily clipping. We finally after all the clips were placed performed another icg and verified we had excellent distal flow in all the branch vessels, and the right mca, but no flow into the aneurysm.  We then irrigated then closed the opening. The dura was approximated with a piece of duragen also to obtain as watertight a  closure as possible. We approximated the skull flap with plates and screws, the galea with vicryl sutures, and the skin with staples. I applied a sterile dressing. She was removed from the [facility] without difficulties.   PLAN OF CARE: Admit to inpatient   PATIENT DISPOSITION:  PACU - hemodynamically stable.   Delay start of Pharmacological VTE agent (>24hrs) due to surgical blood  loss or risk of bleeding:  yes

## 2014-07-07 MED ORDER — DEXAMETHASONE SODIUM PHOSPHATE 4 MG/ML IJ SOLN
4.0000 mg | Freq: Four times a day (QID) | INTRAMUSCULAR | Status: AC
  Administered 2014-07-07 (×2): 4 mg via INTRAVENOUS
  Filled 2014-07-07 (×2): qty 1

## 2014-07-07 MED ORDER — ONDANSETRON HCL 4 MG/2ML IJ SOLN
4.0000 mg | Freq: Once | INTRAMUSCULAR | Status: AC
  Administered 2014-07-07: 4 mg via INTRAVENOUS

## 2014-07-07 MED ORDER — DEXAMETHASONE SODIUM PHOSPHATE 4 MG/ML IJ SOLN: 4.0000 mg | Freq: Four times a day (QID) | INTRAMUSCULAR | Status: DC

## 2014-07-07 MED ORDER — MORPHINE SULFATE 4 MG/ML IJ SOLN
4.0000 mg | INTRAMUSCULAR | Status: DC | PRN
  Administered 2014-07-07 – 2014-07-10 (×9): 4 mg via INTRAVENOUS
  Filled 2014-07-07 (×9): qty 1

## 2014-07-07 NOTE — Progress Notes (Signed)
Pt seen and examined. No issues overnight. Pt has HA. No other c/o.  EXAM: Temp:  [97.1 F (36.2 C)-98.7 F (37.1 C)] 97.1 F (36.2 C) (08/29 0845) Pulse Rate:  [52-79] 68 (08/29 0900) Resp:  [12-24] 24 (08/29 0900) BP: (91-118)/(58-77) 100/66 mmHg (08/29 0900) SpO2:  [89 %-100 %] 96 % (08/29 0900) Arterial Line BP: (81-143)/(54-90) 116/73 mmHg (08/28 2030) Weight:  [88.9 kg (195 lb 15.8 oz)] 88.9 kg (195 lb 15.8 oz) (08/28 1331) Intake/Output     08/28 0701 - 08/29 0700 08/29 0701 - 08/30 0700   P.O. 240    I.V. (mL/kg) 3613.3 (40.6) 160 (1.8)   IV Piggyback 100    Total Intake(mL/kg) 3953.3 (44.5) 160 (1.8)   Urine (mL/kg/hr) 3475 (1.6) 650 (2.6)   Emesis/NG output 75 (0)    Blood 300 (0.1)    Total Output 3850 650   Net +103.3 -490         Awake, alert, oriented Speech fluent Vision intact Good strength throughout, no drift  LABS: Lab Results  Component Value Date   CREATININE 0.83 06/27/2014   BUN 13 06/27/2014   NA 138 07/06/2014   K 3.4* 07/06/2014   CL 103 06/27/2014   CO2 23 06/27/2014   Lab Results  Component Value Date   WBC 7.3 06/27/2014   HGB 10.9* 07/06/2014   HCT 32.0* 07/06/2014   MCV 88.2 06/27/2014   PLT 218 06/27/2014    IMPRESSION: - 48 y.o. female POD#1 s/p right pterional crani for RMCA aneurysm clipping, neurologically at baseline  PLAN: - Cont routine postop care - Keppra for postop SZ prophylaxis - D/C foley, d/c CVC, OOB today - Decadron taper - Likely transfer to floor tomorrow.

## 2014-07-08 MED ORDER — PANTOPRAZOLE SODIUM 40 MG PO TBEC
40.0000 mg | DELAYED_RELEASE_TABLET | Freq: Every day | ORAL | Status: DC
  Administered 2014-07-08 – 2014-07-11 (×4): 40 mg via ORAL
  Filled 2014-07-08 (×4): qty 1

## 2014-07-08 NOTE — Progress Notes (Signed)
Patient transferred to [place] [facility]. Stable on wheel chair. Daughter informed on transfer and new room number.

## 2014-07-08 NOTE — Progress Notes (Signed)
Pt seen and examined. No issues overnight. C/o generalized fatigue and some HA, otherwise well.  EXAM: Temp:  [97.8 F (36.6 C)-98.6 F (37 C)] 97.8 F (36.6 C) (08/30 0812) Pulse Rate:  [57-70] 63 (08/30 0800) Resp:  [12-24] 12 (08/30 0800) BP: (96-115)/(60-78) 99/70 mmHg (08/30 0800) SpO2:  [92 %-98 %] 93 % (08/30 0800) Weight:  [88.3 kg (194 lb 10.7 oz)] 88.3 kg (194 lb 10.7 oz) (08/30 0600) Intake/Output     08/29 0701 - 08/30 0700 08/30 0701 - 08/31 0700   P.O. 180    I.V. (mL/kg) 1920 (21.7) 80 (0.9)   IV Piggyback 200    Total Intake(mL/kg) 2300 (26) 80 (0.9)   Urine (mL/kg/hr) 3400 (1.6)    Emesis/NG output     Blood     Total Output 3400     Net -1100 +80         Awake, alert, oriented Right-sided peri-orbital edema, able to open eye CN grossly intact MAE good strength, no drift Wound c/d/i  LABS: Lab Results  Component Value Date   CREATININE 0.83 06/27/2014   BUN 13 06/27/2014   NA 138 07/06/2014   K 3.4* 07/06/2014   CL 103 06/27/2014   CO2 23 06/27/2014   Lab Results  Component Value Date   WBC 7.3 06/27/2014   HGB 10.9* 07/06/2014   HCT 32.0* 07/06/2014   MCV 88.2 06/27/2014   PLT 218 06/27/2014    IMPRESSION: - 48 y.o. female POD#2 s/p clipping RMCA aneurysm  PLAN: - Transfer to floor - Cont to mobilize

## 2014-07-09 LAB — TYPE AND SCREEN
ABO/RH(D): A POS
Antibody Screen: NEGATIVE
UNIT DIVISION: 0
Unit division: 0

## 2014-07-09 MED ORDER — DEXAMETHASONE 4 MG PO TABS
4.0000 mg | ORAL_TABLET | Freq: Two times a day (BID) | ORAL | Status: DC
  Administered 2014-07-09 – 2014-07-11 (×4): 4 mg via ORAL
  Filled 2014-07-09 (×4): qty 1

## 2014-07-09 MED ORDER — LEVETIRACETAM 500 MG PO TABS
500.0000 mg | ORAL_TABLET | Freq: Two times a day (BID) | ORAL | Status: DC
Start: 2014-07-09 — End: 2014-07-11
  Administered 2014-07-09 – 2014-07-11 (×4): 500 mg via ORAL
  Filled 2014-07-09 (×5): qty 1

## 2014-07-09 NOTE — Progress Notes (Signed)
UR complete.  Daevon Holdren RN, MSN 

## 2014-07-09 NOTE — Clinical Documentation Improvement (Signed)
  K+ 3.4 on 07/06/14.  Treated with K+ Chloride 10 mEq in 100 ml IVPB x2 ordered 07/06/14.  Possible clinical Conditions:   - Hypokalemia   - Other Condition   - Unable to Clinically Determine  Thank You, Erling Conte ,RN Clinical Documentation Specialist:  [phone] [place] [employer]

## 2014-07-09 NOTE — Progress Notes (Signed)
Patient ID: Melissa Morgan, female   DOB: 26-Oct-1966, 48 y.o.   MRN: [id number] BP 107/73  Pulse 63  Temp(Src) 98.1 F (36.7 C) (Oral)  Resp 18  Ht 5\' 7"  (1.702 m)  Wt 88.3 kg (194 lb 10.7 oz)  BMI 30.48 kg/m2  SpO2 95% Alert and oriented x 4, speech is clear and fluent Moving all extremities Perrl, full eom Symmetric facies Tongue and uvula midline Doing very well

## 2014-07-10 ENCOUNTER — Encounter (HOSPITAL_COMMUNITY): Payer: Self-pay | Admitting: Neurosurgery

## 2014-07-10 MED ORDER — WHITE PETROLATUM GEL
Status: AC
  Administered 2014-07-10: 14:00:00
  Filled 2014-07-10: qty 5

## 2014-07-10 NOTE — Progress Notes (Signed)
Patient ID: Rossetta Kama, female   DOB: Mar 31, 1966, 48 y.o.   MRN: [id number] BP 94/59  Pulse 75  Temp(Src) 98.2 F (36.8 C) (Oral)  Resp 20  Ht 5\' 7"  (1.702 m)  Wt 88.3 kg (194 lb 10.7 oz)  BMI 30.48 kg/m2  SpO2 92% Alert and oriented x 4, speech is clear, and fluent Perrl, full eom Symmetric facies, tongue and uvula midline No drift Moving all extremities well Wound is clean, dry, without signs of infection Discharge tomorrow

## 2014-07-10 NOTE — Progress Notes (Deleted)
Telephone report given to [facility]. Patient transferred to 4W-26. Assessment stable. Daughter Jonna Clark) notified of transfer and location.

## 2014-07-11 MED ORDER — LEVETIRACETAM 500 MG PO TABS: 500.0000 mg | ORAL_TABLET | Freq: Two times a day (BID) | ORAL | Status: DC

## 2014-07-11 MED ORDER — OXYCODONE-ACETAMINOPHEN 5-325 MG PO TABS: 1.0000 | ORAL_TABLET | Freq: Four times a day (QID) | ORAL | Status: DC | PRN

## 2014-07-11 MED ORDER — HYDROCODONE-ACETAMINOPHEN 5-325 MG PO TABS: 1.0000 | ORAL_TABLET | Freq: Four times a day (QID) | ORAL | Status: DC | PRN

## 2014-07-11 NOTE — Discharge Instructions (Signed)
Craniotomy °Care After °Please read the instructions outlined below and refer to this sheet in the next few weeks. These discharge instructions provide you with general information on caring for yourself after you leave the hospital. Your surgeon may also give you specific instructions. While your treatment has been planned according to the most current medical practices available, unavoidable complications occasionally occur. If you have any problems or questions after discharge, please call your surgeon. °Although there are many types of brain surgery, recovery following craniotomy (surgical opening of the skull) is much the same for each. However, recovery depends on many factors. These include the type and severity of brain injury and the type of surgery. It also depends on any nervous system function problems (neurological deficits) before surgery. If the craniotomy was done for cancer, chemotherapy and radiation could follow. You could be in the hospital from 5 days to a couple weeks. This depends on the type of surgery, findings, and whether there are complications. °HOME CARE INSTRUCTIONS  °· It is not unusual to hear a clicking noise after a craniotomy, the plates and screws used to attach the bone flap can sometimes cause this. It is a normal occurrence if this does happen °· Do not drive for 10 days after the operation °· Your scalp may feel spongy for a while, because of fluid under it. This will gradually get better. Occasionally, the surgeon will not replace the bone that was removed to access the brain. If there is a bony defect, the surgeon will ask you to wear a helmet for protection. This is a discussion you should have with your surgeon prior to leaving the hospital (discharge). °· Numbness may persist in some areas of your scalp. °· Take all medications as directed. Sometimes steroids to control swelling are prescribed. Anticonvulsants to prevent seizures may also be given. Do not use alcohol,  other drugs, or medications unless your surgeon says it is OK. °· Keep the wound dry and clean. The wound may be washed gently with soap and water. Then, you may gently blot or dab it dry, without rubbing. Do not take baths, use swimming pools or hot tubs for 10 days, or as instructed by your caregiver. It is best to wait to see you surgeon at your first postoperative visit, and to get directions at that time. °· Only take over-the-counter or prescription medicines for pain, discomfort, or fever as directed by your caregiver. °· You may continue your normal diet, as directed. °· Walking is OK for exercise. Wait at least 3 months before you return to mild, non-contact sports or as your surgeon suggests. Contact sports should be avoided for at least 1 year, unless your surgeon says it is OK. °· If you are prescribed steroids, take them exactly as prescribed. If you start having a decrease in nervous system functions (neurological deficits) and headaches as the dose of steroids is reduced, tell your surgeon right away. °· When the anticonvulsant prescription is finished you no longer need to take it. °SEEK IMMEDIATE MEDICAL CARE IF:  °· You develop nausea, vomiting, severe headaches, confusion, or you have a seizure. °· You develop chest pain, a stiff neck, or difficulty breathing. °· There is redness, swelling, or increasing pain in the wound or pin insertion sites. °· You have an increase in swelling or bruising around the eyes. °· There is drainage or pus coming from the wound. °· You have an oral temperature above 102° F (38.9° C), not controlled by medicine. °·   You notice a foul smell coming from the wound or dressing. °· The wound breaks open (edges not staying together) after the stitches have been removed. °· You develop dizziness or fainting while standing. °· You develop a rash. °· You develop any reaction or side effects to the medications given. °Document Released: 01/26/2006 Document Revised: 01/18/2012  Document Reviewed: 11/04/2009 °ExitCare® Patient Information ©2013 [employer]. ° °

## 2014-07-11 NOTE — Discharge Summary (Signed)
  Physician Discharge Summary  Patient ID: Demari Kropp MRN: [id number] DOB/AGE: [birth year] 48 y.o.  Admit date: 07/06/2014 Discharge date: 07/11/2014  Admission Diagnoses:right middle cerebral artery aneurysm unruptured  Discharge Diagnoses:  Active Problems:   Cerebral aneurysm without rupture   Discharged Condition: good  Hospital Course: Mrs. Dafoe was admitted and taken to the operating room where she had an uncomplicated craniotomy and clipping of her cerebral aneurysm. Post op she has done very well. Her wound is clean, dry, and without signs of infection. Her neurological exam is normal. She is voiding, ambulating, and tolerating a regular diet at discharge.   Treatments: surgery: Craniotomy for aneurysm clipping.   Discharge Exam: Blood pressure 110/70, pulse 59, temperature 98.4 F (36.9 C), temperature source Oral, resp. rate 18, height 5\' 7"  (1.702 m), weight 88.3 kg (194 lb 10.7 oz), SpO2 98.00%. General appearance: alert, cooperative and appears stated age Neurologic: Alert and oriented X 3, normal strength and tone. Normal symmetric reflexes. Normal coordination and gait  Disposition: 01-Home or Self Care aneurysm    Medication List         ALPRAZolam 0.5 MG tablet  Commonly known as:  XANAX  Take 1 tablet (0.5 mg total) by mouth 2 (two) times daily as needed for anxiety.     buPROPion 300 MG 24 hr tablet  Commonly known as:  WELLBUTRIN XL  Take 1 tablet (300 mg total) by mouth daily.     HYDROcodone-acetaminophen 5-325 MG per tablet  Commonly known as:  NORCO/VICODIN  Take 1 tablet by mouth as needed for moderate pain (every 4-6 hrs as needed).     HYDROcodone-acetaminophen 5-325 MG per tablet  Commonly known as:  NORCO/VICODIN  Take 1 tablet by mouth every 6 (six) hours as needed for moderate pain.     levETIRAcetam 500 MG tablet  Commonly known as:  KEPPRA  Take 1 tablet (500 mg total) by mouth every 12 (twelve) hours.     metoprolol tartrate 25 MG  tablet  Commonly known as:  LOPRESSOR  Take 25 mg by mouth daily.     neomycin-bacitracin-polymyxin ointment  Commonly known as:  NEOSPORIN  Apply 1 application topically daily as needed for wound care.     oxyCODONE-acetaminophen 5-325 MG per tablet  Commonly known as:  ROXICET  Take 1 tablet by mouth every 6 (six) hours as needed for severe pain.     promethazine 25 MG tablet  Commonly known as:  PHENERGAN  Take 25 mg by mouth every 6 (six) hours as needed for nausea or vomiting.     traZODone 50 MG tablet  Commonly known as:  DESYREL  Take 50 mg by mouth at bedtime.           Follow-up Information   Follow up with Dequarius Jeffries L, MD In 1 week. (for staple removal)    Specialty:  Neurosurgery   Contact information:   [address] [place] [address] [phone]       Signed: [place] Merida L 07/11/2014, 11:07 AM

## 2014-07-16 ENCOUNTER — Other Ambulatory Visit: Payer: Self-pay | Admitting: Family Medicine

## 2014-07-18 NOTE — Telephone Encounter (Signed)
Med filled.  

## 2014-07-21 ENCOUNTER — Encounter (HOSPITAL_BASED_OUTPATIENT_CLINIC_OR_DEPARTMENT_OTHER): Payer: Self-pay | Admitting: Emergency Medicine

## 2014-07-21 ENCOUNTER — Emergency Department (HOSPITAL_BASED_OUTPATIENT_CLINIC_OR_DEPARTMENT_OTHER)
Admission: EM | Admit: 2014-07-21 | Discharge: 2014-07-22 | Disposition: A | Discharge status: Home / Self Care | Payer: BC Managed Care – PPO | Attending: Emergency Medicine | Admitting: Emergency Medicine

## 2014-07-21 ENCOUNTER — Emergency Department (HOSPITAL_BASED_OUTPATIENT_CLINIC_OR_DEPARTMENT_OTHER): Payer: BC Managed Care – PPO

## 2014-07-21 DIAGNOSIS — Z8719 Personal history of other diseases of the digestive system: Secondary | ICD-10-CM | POA: Insufficient documentation

## 2014-07-21 DIAGNOSIS — F3289 Other specified depressive episodes: Secondary | ICD-10-CM | POA: Insufficient documentation

## 2014-07-21 DIAGNOSIS — Z5189 Encounter for other specified aftercare: Secondary | ICD-10-CM | POA: Insufficient documentation

## 2014-07-21 DIAGNOSIS — F329 Major depressive disorder, single episode, unspecified: Secondary | ICD-10-CM | POA: Diagnosis not present

## 2014-07-21 DIAGNOSIS — G40909 Epilepsy, unspecified, not intractable, without status epilepticus: Secondary | ICD-10-CM | POA: Diagnosis not present

## 2014-07-21 DIAGNOSIS — F17200 Nicotine dependence, unspecified, uncomplicated: Secondary | ICD-10-CM | POA: Insufficient documentation

## 2014-07-21 DIAGNOSIS — M25569 Pain in unspecified knee: Secondary | ICD-10-CM | POA: Insufficient documentation

## 2014-07-21 DIAGNOSIS — F411 Generalized anxiety disorder: Secondary | ICD-10-CM | POA: Diagnosis not present

## 2014-07-21 DIAGNOSIS — Z91040 Latex allergy status: Secondary | ICD-10-CM | POA: Diagnosis not present

## 2014-07-21 DIAGNOSIS — I82409 Acute embolism and thrombosis of unspecified deep veins of unspecified lower extremity: Secondary | ICD-10-CM | POA: Insufficient documentation

## 2014-07-21 DIAGNOSIS — M129 Arthropathy, unspecified: Secondary | ICD-10-CM | POA: Diagnosis not present

## 2014-07-21 DIAGNOSIS — I82401 Acute embolism and thrombosis of unspecified deep veins of right lower extremity: Secondary | ICD-10-CM

## 2014-07-21 DIAGNOSIS — Z87448 Personal history of other diseases of urinary system: Secondary | ICD-10-CM | POA: Diagnosis not present

## 2014-07-21 DIAGNOSIS — Z8673 Personal history of transient ischemic attack (TIA), and cerebral infarction without residual deficits: Secondary | ICD-10-CM | POA: Diagnosis not present

## 2014-07-21 MED ORDER — RIVAROXABAN 15 MG PO TABS
15.0000 mg | ORAL_TABLET | Freq: Once | ORAL | Status: AC
  Administered 2014-07-22: 15 mg via ORAL
  Filled 2014-07-21: qty 1

## 2014-07-21 MED ORDER — XARELTO VTE STARTER PACK 15 & 20 MG PO TBPK: 15.0000 mg | ORAL_TABLET | ORAL | Status: DC

## 2014-07-21 NOTE — Discharge Instructions (Signed)

## 2014-07-21 NOTE — ED Notes (Signed)
Pt reports pain in right calf. Pt had brain surgery August 28th by Dr. Cyndy Freeze. Painful with walking. Pain began 2-3 days ago.

## 2014-07-21 NOTE — ED Provider Notes (Signed)
CSN: [id number]     Arrival date & time 07/21/14  2058 History   First MD Initiated Contact with Patient 07/21/14 2153     Chief Complaint  Patient presents with  . Leg Pain     (Consider location/radiation/quality/duration/timing/severity/associated sxs/prior Treatment) Patient is a 48 y.o. female presenting with leg pain. The history is provided by the patient. No language interpreter was used.  Leg Pain Location:  Knee Time since incident:  2 days Injury: no   Knee location:  R knee Pain details:    Quality:  Aching and shooting   Radiates to:  Does not radiate   Severity:  Moderate   Onset quality:  Gradual   Duration:  2 days   Timing:  Constant   Progression:  Worsening Chronicity:  New Dislocation: no   Foreign body present:  No foreign bodies Prior injury to area:  No Relieved by:  Nothing Worsened by:  Nothing tried Pt had surgery 8/28. For a middle cerebral anyerism.  Pt complains of swelling in her leg.  Pt reports she has a history of clotting abnormality and is suppose to be on an aspirin a day  Past Medical History  Diagnosis Date  . Depression   . Tachycardia   . Anxiety   . Lacunar infarction   . Endometriosis   . Headache   . PONV (postoperative nausea and vomiting)     extremely sick  . Stroke     cva  14  . GERD (gastroesophageal reflux disease)     occ  . Seizures     tx 5 yrs in middle school  continuous  . Arthritis    Past Surgical History  Procedure Laterality Date  . Lasar laparoscopy      for endometriosis x3  . Craniotomy Right 07/06/2014    Procedure: CRANIOTOMY INTRACRANIAL ANEURYSM FOR CAROTID;  Surgeon: Ashok Pall, MD;  Location: [facility];  Service: Neurosurgery;  Laterality: Right;  right    Family History  Problem Relation Age of Onset  . Hypertension Brother     2 out of 3 brothers  . Heart attack Brother     2 out of 3 brothers  . Diabetes Brother   . Asthma Sister   . Hypertension Mother   . Heart attack  Father   . Breast cancer Sister    History  Substance Use Topics  . Smoking status: Current Some Day Smoker -- 1.00 packs/day for 22 years  . Smokeless tobacco: Never Used  . Alcohol Use: No   OB History   Grav Para Term Preterm Abortions TAB SAB Ect Mult Living                 Review of Systems  All other systems reviewed and are negative.     Allergies  Imitrex; Dilaudid; Ace inhibitors; Codeine; and Latex  Home Medications   Prior to Admission medications   Medication Sig Start Date End Date Taking? Authorizing Provider  ALPRAZolam [name]) 0.5 MG tablet Take 1 tablet (0.5 mg total) by mouth 2 (two) times daily as needed for anxiety. 05/30/14   Midge Minium, MD  buPROPion (WELLBUTRIN XL) 300 MG 24 hr tablet TAKE 1 TABLET BY MOUTH EVERY DAY 07/18/14   Midge Minium, MD  HYDROcodone-acetaminophen (NORCO/VICODIN) 5-325 MG per tablet Take 1 tablet by mouth as needed for moderate pain (every 4-6 hrs as needed). 06/06/14   Midge Minium, MD  HYDROcodone-acetaminophen (NORCO/VICODIN) 5-325 MG per tablet  Take 1 tablet by mouth every 6 (six) hours as needed for moderate pain. 07/11/14   Ashok Pall, MD  levETIRAcetam (KEPPRA) 500 MG tablet Take 1 tablet (500 mg total) by mouth every 12 (twelve) hours. 07/11/14   Ashok Pall, MD  metoprolol tartrate (LOPRESSOR) 25 MG tablet Take 25 mg by mouth daily.     Historical Provider, MD  neomycin-bacitracin-polymyxin (NEOSPORIN) ointment Apply 1 application topically daily as needed for wound care.     Historical Provider, MD  oxyCODONE-acetaminophen (ROXICET) 5-325 MG per tablet Take 1 tablet by mouth every 6 (six) hours as needed for severe pain. 07/11/14   Ashok Pall, MD  promethazine (PHENERGAN) 25 MG tablet Take 25 mg by mouth every 6 (six) hours as needed for nausea or vomiting.    Historical Provider, MD  traZODone (DESYREL) 50 MG tablet TAKE 1/2 TO 1 TABLET BY MOUTH AT BEDTIME AS NEEDED FOR SLEEP 07/18/14   Midge Minium, MD    BP 125/78  Pulse 73  Temp(Src) 98.3 F (36.8 C) (Oral)  Resp 16  Ht 5\' 7"  (5.916 m)  Wt 195 lb (88.451 kg)  BMI 30.53 kg/m2  SpO2 100%  LMP 12/28/2012 Physical Exam  Nursing note and vitals reviewed. Constitutional: She is oriented to person, place, and time. She appears well-developed and well-nourished.  HENT:  Head: Normocephalic and atraumatic.  Eyes: EOM are normal. Pupils are equal, round, and reactive to light.  Neck: Normal range of motion.  Cardiovascular: Normal rate.   Pulmonary/Chest: Effort normal.  Abdominal: She exhibits no distension.  Musculoskeletal:  Tender right calf,  Slight swelling  Neurological: She is alert and oriented to person, place, and time.  Skin: Skin is warm.  Psychiatric: She has a normal mood and affect.    ED Course  Procedures (including critical care time) Labs Review Labs Reviewed - No data to display  Imaging Review US Venous Img Lower Unilateral Right  07/21/2014   CLINICAL DATA:  Right lower extremity pain and swelling.  EXAM: Right LOWER EXTREMITY VENOUS DOPPLER ULTRASOUND  TECHNIQUE: Gray-scale sonography with graded compression, as well as color Doppler and duplex ultrasound were performed to evaluate the lower extremity deep venous systems from the level of the common femoral vein and including the common femoral, femoral, profunda femoral, popliteal and calf veins including the posterior tibial, peroneal and gastrocnemius veins when visible. The superficial great saphenous vein was also interrogated. Spectral Doppler was utilized to evaluate flow at rest and with distal augmentation maneuvers in the common femoral, femoral and popliteal veins.  COMPARISON:  None.  FINDINGS: Common Femoral Vein: No evidence of thrombus. Normal compressibility, respiratory phasicity and response to augmentation.  Saphenofemoral Junction: No evidence of thrombus. Normal compressibility and flow on color Doppler imaging.  Profunda Femoral Vein: No  evidence of thrombus. Normal compressibility and flow on color Doppler imaging.  Femoral Vein: No evidence of thrombus. Normal compressibility, respiratory phasicity and response to augmentation.  Popliteal Vein: No evidence of thrombus. Normal compressibility, respiratory phasicity and response to augmentation.  Calf Veins: 2 cm segment of thrombus in a peroneal vein in the mid calf.  Superficial Great Saphenous Vein: No evidence of thrombus. Normal compressibility and flow on color Doppler imaging.  Venous Reflux:  None.  Other Findings:  None.  IMPRESSION: Focal area of segmental thrombosis in a peroneal vein in the mid calf.  The remaining deep venous structures are patent.   Electronically Signed   By: Veneta Penton.D.  On: 07/21/2014 22:28     EKG Interpretation None      MDM xarelto,   See Dr. Birdie Riddle for recheck this week   Final diagnoses:  DVT (deep venous thrombosis), right   I spoke to Dr. Ronnald Ramp on call for Dr. Christella Noa. He advised safe to given xarelto for treatment.    [place], PA-C 07/21/14 2346

## 2014-07-22 NOTE — Progress Notes (Signed)
Spoke w/ pt and pt's daughter via phone regarding concerns about Xarelto s/p brain surgery.  Discussed signs of bleeding and stroke and answered questions.  Reviewed home medications, no clear interactions.  Wynona Neat, PharmD, BCPS   07/22/2014 12:27 AM

## 2014-07-22 NOTE — ED Notes (Signed)
EDP notified that pt reports blood in her stool (intermittently) for the past few days. Connye Burkitt, RN

## 2014-07-23 ENCOUNTER — Ambulatory Visit (INDEPENDENT_AMBULATORY_CARE_PROVIDER_SITE_OTHER): Payer: BC Managed Care – PPO | Admitting: Medical

## 2014-07-23 ENCOUNTER — Encounter: Payer: Self-pay | Admitting: Family Medicine

## 2014-07-23 ENCOUNTER — Telehealth: Payer: Self-pay | Admitting: Family Medicine

## 2014-07-23 ENCOUNTER — Encounter: Payer: Self-pay | Admitting: Medical

## 2014-07-23 VITALS — BP 106/74 | HR 98 | Temp 98.3°F | Ht 67.75 in | Wt 186.0 lb

## 2014-07-23 DIAGNOSIS — Z86718 Personal history of other venous thrombosis and embolism: Secondary | ICD-10-CM | POA: Insufficient documentation

## 2014-07-23 DIAGNOSIS — I82401 Acute embolism and thrombosis of unspecified deep veins of right lower extremity: Secondary | ICD-10-CM

## 2014-07-23 DIAGNOSIS — I82409 Acute embolism and thrombosis of unspecified deep veins of unspecified lower extremity: Secondary | ICD-10-CM

## 2014-07-23 MED ORDER — RIVAROXABAN 20 MG PO TABS: 20.0000 mg | ORAL_TABLET | Freq: Every day | ORAL | Status: DC

## 2014-07-23 NOTE — Telephone Encounter (Signed)
Pt was supposed to start xarelto for her DVT.

## 2014-07-23 NOTE — Progress Notes (Signed)
   Subjective:    Patient ID: Melissa Morgan, female    DOB: [birth year], 48 y.o.   MRN: [id number]  HPI  Pt in with rt leg pain. She states her right calf hurts. When she stands this is when pain is most severe. Pt states pain in rt calf started around 13-14 days post up(After cranial surgery for aneurysm repair right cerebral artery). Pt went to ED the other night due to pain and was diagnosed with dvt. Pt states she had no prior history of dvt. But she did have history of superficial phlebitis. Pt was on aspirin previously before her recent intracranial surgery.   History of some propensity to clot per patient. This was found on work up for fertility years ago. They told her never to get off of aspirin.   Pt did start the xarelto. Just started the xarelto yesterday. On the 3rd tablet.   Pt states her neurosurgeon is aware of what is going on and he does not have problem with treatment of her dvt.    Review of Systems  Constitutional: Negative for fever, chills and fatigue.  HENT: Negative.   Respiratory: Negative for cough, chest tightness, shortness of breath and wheezing.   Cardiovascular: Negative for chest pain and palpitations.  Gastrointestinal: Negative.   Musculoskeletal: Negative for back pain, myalgias and neck stiffness.       Rt calf pain.  Skin: Negative.   Neurological: Negative for dizziness, tremors, seizures, syncope, weakness, light-headedness, numbness and headaches.  Hematological: Negative for adenopathy. Does not bruise/bleed easily.       Objective:   Physical Exam  Constitutional: She is oriented to person, place, and time. She appears well-developed and well-nourished. No distress.  HENT:  Head: Normocephalic and atraumatic.  Eyes: Conjunctivae and EOM are normal. Pupils are equal, round, and reactive to light.  Neck: Normal range of motion. Neck supple.  Cardiovascular: Normal rate, regular rhythm and normal heart sounds.   Pulmonary/Chest: Effort normal  and breath sounds normal. No respiratory distress. She has no wheezes. She has no rales. She exhibits no tenderness.  Abdominal: Soft. Bowel sounds are normal. She exhibits no distension and no mass. There is no tenderness. There is no rebound and no guarding.  Musculoskeletal:  Faint positive homans sign rt calf now. Mild swelling rt calf compared to left. Rt calf about 10 mm larger than left. Rt calf is not warm or red.  Neurological: She is oriented to person, place, and time. No cranial nerve deficit. Coordination normal.  CN III-XII intact, Neg romberg, no gross motor deficits or neuro deficits. Finger to nose intact. Symmetric smile. Equal and symmetric 5/5 upper and lower extremity strength. Heal to toe gait normal.  Skin: She is not diaphoretic.  Psychiatric: She has a normal mood and affect. Her behavior is normal. Judgment and thought content normal.  But very anxious about her dvt.          Assessment & Plan:

## 2014-07-23 NOTE — Telephone Encounter (Signed)
Spoke with pt and advised that Dr. Birdie Riddle is aware of all of her hospital encounters.   Pt is worried about finding out she has DVT, stated she was not told how to treat clot, or what next steps should be. Pt just underwent a craniotomy and was concerned. Melissa Morgan

## 2014-07-23 NOTE — Telephone Encounter (Signed)
Pt on xarelto, was very emotional on the phone. I think just really wants someone to talk her through her treatment steps.

## 2014-07-23 NOTE — Assessment & Plan Note (Signed)
Patient is on xarelto starter pack and will continue until this is finished. Then she will start the new prescription I sent to her pharmacy today. This would last her for full 3 months. I went ahead and referred her to hematologist  because of possible hypercoagulation disorder . I put referral in today.

## 2014-07-23 NOTE — Telephone Encounter (Signed)
Caller name: Timea Relation to pt: self Call back number: [phone] Pharmacy:  Reason for call:   Patient called in stating that seen was seen in the ED over weekend(please see mychart message from today). Patient insisted on being seen today. Scheduled appointment with Percell Miller for this afternoon. Patient wants a callback from [place] as well.

## 2014-07-23 NOTE — Patient Instructions (Addendum)
For your dvt, continue with the xarelto. I will send refill to your pharmacy to get you to 3 months. Also, I will send you to hematologist to evaluate clotting disorder based on your report of potential clotting disorder for years. You may be on xarelto longer pending  specialist visit/opinion. If your get any worsening pain or pain changing local in your rt lower extremitym then would repeat your lower extremity doppler. If any dyspnea or any change in mental status/neurologic changes then be seen in ED. Follow up in 2-3 wks Dr. Birdie Riddle or as needed with me.

## 2014-07-26 ENCOUNTER — Telehealth: Payer: Self-pay | Admitting: Hematology & Oncology

## 2014-07-26 ENCOUNTER — Ambulatory Visit: Payer: BC Managed Care – PPO

## 2014-07-26 NOTE — Telephone Encounter (Signed)
Per Md orders to sch a New Patient apt for next week... Patient was sch for 08/01/14 at 1pm. I called patient and gave apt date and time.  Patient spoke of her concerns of wanting to be seen earlier, and mentioned she would go to [facility] if she could not be seen sooner.  She did take apt date of 08/01/14 but Vivia Birmingham stated she will call patient back.  Patient is waiting for Rn to call her back

## 2014-07-26 NOTE — Telephone Encounter (Signed)
Per Md to sch for 07/27/14.  Patient was called and given apt date/time and directions.  Patient stated she will be here

## 2014-07-26 NOTE — Telephone Encounter (Signed)
Claiborne Billings spoke w Melissa Morgan today to remind them of their appointment with Dr. Marin Olp. Also, advised them to bring all medication bottles and insurance card information.

## 2014-07-27 ENCOUNTER — Ambulatory Visit (HOSPITAL_BASED_OUTPATIENT_CLINIC_OR_DEPARTMENT_OTHER): Payer: BC Managed Care – PPO | Admitting: Hematology & Oncology

## 2014-07-27 ENCOUNTER — Encounter: Payer: Self-pay | Admitting: Hematology & Oncology

## 2014-07-27 ENCOUNTER — Ambulatory Visit: Payer: BC Managed Care – PPO

## 2014-07-27 ENCOUNTER — Other Ambulatory Visit (HOSPITAL_BASED_OUTPATIENT_CLINIC_OR_DEPARTMENT_OTHER): Payer: BC Managed Care – PPO | Admitting: Lab

## 2014-07-27 VITALS — BP 101/73 | HR 71 | Temp 97.8°F | Resp 14 | Ht 67.0 in | Wt 186.0 lb

## 2014-07-27 DIAGNOSIS — I824Z9 Acute embolism and thrombosis of unspecified deep veins of unspecified distal lower extremity: Secondary | ICD-10-CM

## 2014-07-27 DIAGNOSIS — I82402 Acute embolism and thrombosis of unspecified deep veins of left lower extremity: Secondary | ICD-10-CM

## 2014-07-27 DIAGNOSIS — K921 Melena: Secondary | ICD-10-CM

## 2014-07-27 LAB — CBC WITH DIFFERENTIAL (CANCER CENTER ONLY)
BASO#: 0 10*3/uL (ref 0.0–0.2)
BASO%: 0.5 % (ref 0.0–2.0)
EOS ABS: 0.3 10*3/uL (ref 0.0–0.5)
EOS%: 5 % (ref 0.0–7.0)
HCT: 36.4 % (ref 34.8–46.6)
HGB: 12.2 g/dL (ref 11.6–15.9)
LYMPH#: 1.9 10*3/uL (ref 0.9–3.3)
LYMPH%: 32.5 % (ref 14.0–48.0)
MCH: 30.7 pg (ref 26.0–34.0)
MCHC: 33.5 g/dL (ref 32.0–36.0)
MCV: 92 fL (ref 81–101)
MONO#: 0.5 10*3/uL (ref 0.1–0.9)
MONO%: 8 % (ref 0.0–13.0)
NEUT#: 3.2 10*3/uL (ref 1.5–6.5)
NEUT%: 54 % (ref 39.6–80.0)
Platelets: 241 10*3/uL (ref 145–400)
RBC: 3.98 10*6/uL (ref 3.70–5.32)
RDW: 13.6 % (ref 11.1–15.7)
WBC: 5.8 10*3/uL (ref 3.9–10.0)

## 2014-07-27 NOTE — Progress Notes (Signed)
Referral MD  Reason for Referral: Thrombus in the right peroneal vein   Chief Complaint  Patient presents with  . NEW PATIENT  : Have a blood clot in my right leg  HPI: Melissa Morgan is a very charming 48 year old white female. She recently had cerebral aneurysm surgery. She was having headaches. She was operated on by Dr. Cyndy Freeze for a middle cerebral artery aneurysm. Surgery was done back on August 20.  Of note, she said that back in March, she broke her right foot. She was in a boot. She is in a boot for several weeks before it began to really hurt her back.  About 2 weeks or so after surgery, she began to have pain in the right calf. This got worse. She simply had a Doppler done. It showed a focal thrombus in the right peroneal vein. There is nothing in the thigh. Patient is placed on anticoagulation with Xarelto.  She was then referred to the [facility] for an evaluation.  Of note, she had her daughter wishes 44 years old. She had no problems with pregnancy.  She was placed on infertility treatments about 5 years ago. She had no problems with these. However, her husband, at the time of, had issues that prevented pregnancy.  Her aneurysm surgery has gone well. She has occasional headaches and dizziness.  She's had no bleeding. There's been no cough. Patient has had some intermittent hematochezia. She's never had a colonoscopy. There is no history of colon cancer the family. There is no hematuria. There's no nausea or vomiting.  She really has not had any leg swelling.  she is pretty anxious. She comes in with her daughter. Her daughter is going through a lot of personal issues right now.  Melissa Morgan is worried about all her she hears on the news and TV about Xarelto and the bleeding problems.  Currently, her performance status is ECOG 1.           Past Medical History  Diagnosis Date  . Depression   . Tachycardia   . Anxiety   . Lacunar infarction   . Endometriosis    . Headache   . PONV (postoperative nausea and vomiting)     extremely sick  . Stroke     cva  14  . GERD (gastroesophageal reflux disease)     occ  . Seizures     tx 5 yrs in middle school  continuous  . Arthritis   :  Past Surgical History  Procedure Laterality Date  . Lasar laparoscopy      for endometriosis x3  . Craniotomy Right 07/06/2014    Procedure: CRANIOTOMY INTRACRANIAL ANEURYSM FOR CAROTID;  Surgeon: Ashok Pall, MD;  Location: [facility];  Service: Neurosurgery;  Laterality: Right;  right   :  Current outpatient prescriptions:ALPRAZolam (XANAX) 0.5 MG tablet, Take 1 tablet (0.5 mg total) by mouth 2 (two) times daily as needed for anxiety., Disp: 60 tablet, Rfl: 1;  buPROPion (WELLBUTRIN XL) 300 MG 24 hr tablet, TAKE 1 TABLET BY MOUTH EVERY DAY, Disp: 30 tablet, Rfl: 2;  HYDROcodone-acetaminophen (NORCO/VICODIN) 5-325 MG per tablet, Take 1 tablet by mouth as needed for moderate pain (every 4-6 hrs as needed)., Disp: 30 tablet, Rfl: 0 levETIRAcetam (KEPPRA) 500 MG tablet, Take 1 tablet (500 mg total) by mouth every 12 (twelve) hours., Disp: 40 tablet, Rfl: 1;  metoprolol tartrate (LOPRESSOR) 25 MG tablet, Take 25 mg by mouth daily. , Disp: , Rfl: ;  neomycin-bacitracin-polymyxin (  NEOSPORIN) ointment, Apply 1 application topically daily as needed for wound care. , Disp: , Rfl:  oxyCODONE-acetaminophen (ROXICET) 5-325 MG per tablet, Take 1 tablet by mouth every 6 (six) hours as needed for severe pain., Disp: 40 tablet, Rfl: 0;  promethazine (PHENERGAN) 25 MG tablet, Take 25 mg by mouth every 6 (six) hours as needed for nausea or vomiting., Disp: , Rfl: ;  rivaroxaban (XARELTO) 20 MG TABS tablet, Take 1 tablet (20 mg total) by mouth daily with supper., Disp: 30 tablet, Rfl: 1 traZODone (DESYREL) 50 MG tablet, TAKE 1/2 TO 1 TABLET BY MOUTH AT BEDTIME AS NEEDED FOR SLEEP, Disp: 30 tablet, Rfl: 1;  XARELTO STARTER PACK 15 & 20 MG TBPK, Take 15-20 mg by mouth as directed. Take as  directed on package: Start with one 15mg  tablet by mouth twice a day with food. On Day 22, switch to one 20mg  tablet once a day with food., Disp: 51 each, Rfl: 0:  :  Allergies  Allergen Reactions  . Imitrex [Sumatriptan] Other (See Comments)    Increase heart rate and chest pain   . Dilaudid [Hydromorphone Hcl] Nausea And Vomiting  . Ace Inhibitors   . Codeine Nausea Only  . Latex Itching  :  Family History  Problem Relation Age of Onset  . Hypertension Brother     2 out of 3 brothers  . Heart attack Brother     2 out of 3 brothers  . Diabetes Brother   . Asthma Sister   . Hypertension Mother   . Heart attack Father   . Breast cancer Sister   :  History   Social History  . Marital Status: Legally Separated    Spouse Name: N/A    Number of Children: N/A  . Years of Education: N/A   Occupational History  . Not on file.   Social History Main Topics  . Smoking status: Former Smoker -- 1.00 packs/day for 30 years    Start date: 12/28/1983    Quit date: 05/26/2014  . Smokeless tobacco: Never Used     Comment: quit smoking 3 months ago  . Alcohol Use: No  . Drug Use: No  . Sexual Activity: Not on file   Other Topics Concern  . Not on file   Social History Narrative  . No narrative on file  :  Pertinent items are noted in HPI.  Exam: @IPVITALS @  well-developed and well-nourished white female in no I was distress. Vital signs show temperature of 97.8. Pulse 71. Blood pressure 101/73. Weight is 186 pounds. Head and neck exam shows no ocular or oral lesions. She has no palpable cervical or supraclavicular lymph nodes. Lungs are clear. Cardiac exam regular rhythm with no murmurs, rubs or bruits. Abdomen soft. She has good bowel sounds. There is no fluid wave. There is no palpable liver or spleen tip. Extremities shows no clubbing, cyanosis or edema. No venous cords noted in the right leg. She may have a positive Homans sign on the right leg. Neurological exam shows no  focal neurological deficits. Skin exam shows no rashes, ecchymoses or petechia.    Recent Labs  07/27/14 0846  WBC 5.8  HGB 12.2  HCT 36.4  PLT 241   No results found for this basename: NA, K, CL, CO2, GLUCOSE, BUN, CREATININE, CALCIUM,  in the last 72 hours  Blood smear review: None  Pathology: None     Assessment and Plan: Melissa Morgan is a 48 year old female with a thrombus  in the right peroneal vein. She is on Xarelto.  Of note, she apparently was told that she had a be on aspirin for a long time. She is off aspirin with her aneurysm surgery. She did not restarted after the aneurysm surgery., I am not sure why  she had a be on aspirin.  I will do a thrombophilic panel. We will see if she does have a positive results. If she does, that her daughter will have to be checked.  I would probably do a CT scan of her abdomen. She has this hematochezia. She probably needs to be seen by gastroenterology. She probably needs a colonoscopy.  I probably would get her back here in 2 months. I would get a Doppler only see her back.  Hopefully, we might be able to get away with 6 months of Xarelto.  I spent about 45-50 minutes with she and her daughter. I answered all their questions.

## 2014-07-27 NOTE — ED Provider Notes (Signed)
Medical screening examination/treatment/procedure(s) were performed by non-physician practitioner and as supervising physician I was immediately available for consultation/collaboration.   EKG Interpretation None        Tanna Furry, MD 07/27/14 0005

## 2014-07-27 NOTE — Patient Instructions (Addendum)
Smoking Cessation Quitting smoking is important to your health and has many advantages. However, it is not always easy to quit since nicotine is a very addictive drug. Oftentimes, people try 3 times or more before being able to quit. This document explains the best ways for you to prepare to quit smoking. Quitting takes hard work and a lot of effort, but you can do it. ADVANTAGES OF QUITTING SMOKING  You will live longer, feel better, and live better.  Your body will feel the impact of quitting smoking almost immediately.  Within 20 minutes, blood pressure decreases. Your pulse returns to its normal level.  After 8 hours, carbon monoxide levels in the blood return to normal. Your oxygen level increases.  After 24 hours, the chance of having a heart attack starts to decrease. Your breath, hair, and body stop smelling like smoke.  After 48 hours, damaged nerve endings begin to recover. Your sense of taste and smell improve.  After 72 hours, the body is virtually free of nicotine. Your bronchial tubes relax and breathing becomes easier.  After 2 to 12 weeks, lungs can hold more air. Exercise becomes easier and circulation improves.  The risk of having a heart attack, stroke, cancer, or lung disease is greatly reduced.  After 1 year, the risk of coronary heart disease is cut in half.  After 5 years, the risk of stroke falls to the same as a nonsmoker.  After 10 years, the risk of lung cancer is cut in half and the risk of other cancers decreases significantly.  After 15 years, the risk of coronary heart disease drops, usually to the level of a nonsmoker.  If you are pregnant, quitting smoking will improve your chances of having a healthy baby.  The people you live with, especially any children, will be healthier.  You will have extra money to spend on things other than cigarettes. QUESTIONS TO THINK ABOUT BEFORE ATTEMPTING TO QUIT You may want to talk about your answers with your  health care provider.  Why do you want to quit?  If you tried to quit in the past, what helped and what did not?  What will be the most difficult situations for you after you quit? How will you plan to handle them?  Who can help you through the tough times? Your family? Friends? A health care provider?  What pleasures do you get from smoking? What ways can you still get pleasure if you quit? Here are some questions to ask your health care provider:  How can you help me to be successful at quitting?  What medicine do you think would be best for me and how should I take it?  What should I do if I need more help?  What is smoking withdrawal like? How can I get information on withdrawal? GET READY  Set a quit date.  Change your environment by getting rid of all cigarettes, ashtrays, matches, and lighters in your home, car, or work. Do not let people smoke in your home.  Review your past attempts to quit. Think about what worked and what did not. GET SUPPORT AND ENCOURAGEMENT You have a better chance of being successful if you have help. You can get support in many ways.  Tell your family, friends, and coworkers that you are going to quit and need their support. Ask them not to smoke around you.  Get individual, group, or telephone counseling and support. Programs are available at local hospitals and health centers. Call   your local health department for information about programs in your area.  Spiritual beliefs and practices may help some smokers quit.  Download a "quit meter" on your computer to keep track of quit statistics, such as how long you have gone without smoking, cigarettes not smoked, and money saved.  Get a self-help book about quitting smoking and staying off tobacco. [place] yourself from urges to smoke. Talk to someone, go for a walk, or occupy your time with a task.  Change your normal routine. Take a different route to work.  Drink tea instead of coffee. Eat breakfast in a different place.  Reduce your stress. Take a hot bath, exercise, or read a book.  Plan something enjoyable to do every day. Reward yourself for not smoking.  Explore interactive web-based programs that specialize in helping you quit. GET MEDICINE AND USE IT CORRECTLY Medicines can help you stop smoking and decrease the urge to smoke. Combining medicine with the above behavioral methods and support can greatly increase your chances of successfully quitting smoking.  Nicotine replacement therapy helps deliver nicotine to your body without the negative effects and risks of smoking. Nicotine replacement therapy includes nicotine gum, lozenges, inhalers, nasal sprays, and skin patches. Some may be available over-the-counter and others require a prescription.  Antidepressant medicine helps people abstain from smoking, but how this works is unknown. This medicine is available by prescription.  Nicotinic receptor partial agonist medicine simulates the effect of nicotine in your brain. This medicine is available by prescription. Ask your health care provider for advice about which medicines to use and how to use them based on your health history. Your health care provider will tell you what side effects to look out for if you choose to be on a medicine or therapy. Carefully read the information on the package. Do not use any other product containing nicotine while using a nicotine replacement product.  RELAPSE OR DIFFICULT SITUATIONS Most relapses occur within the first 3 months after quitting. Do not be discouraged if you start smoking again. Remember, most people try several times before finally quitting. You may have symptoms of withdrawal because your body is used to nicotine. You may crave cigarettes, be irritable, feel very hungry, cough often, get headaches, or have difficulty concentrating. The withdrawal symptoms are only temporary. They are strongest  when you first quit, but they will go away within 10-14 days. To reduce the chances of relapse, try to:  Avoid drinking alcohol. Drinking lowers your chances of successfully quitting.  Reduce the amount of caffeine you consume. Once you quit smoking, the amount of caffeine in your body increases and can give you symptoms, such as a rapid heartbeat, sweating, and anxiety.  Avoid smokers because they can make you want to smoke.  Do not let weight gain distract you. Many smokers will gain weight when they quit, usually less than 10 pounds. Eat a healthy diet and stay active. You can always lose the weight gained after you quit.  Find ways to improve your mood other than smoking. FOR MORE INFORMATION  www.smokefree.gov  Document Released: 10/20/2001 Document Revised: 03/12/2014 Document Reviewed: 02/04/2012 [facility] Patient Information 2015 [place], [place]. This information is not intended to replace advice given to you by your health care provider. Make sure you discuss any questions you have with your health care provider. Smoking Cessation Quitting smoking is important to your health and has many advantages. However, it is not always easy to quit since  nicotine is a very addictive drug. Oftentimes, people try 3 times or more before being able to quit. This document explains the best ways for you to prepare to quit smoking. Quitting takes hard work and a lot of effort, but you can do it. ADVANTAGES OF QUITTING SMOKING  You will live longer, feel better, and live better.  Your body will feel the impact of quitting smoking almost immediately.  Within 20 minutes, blood pressure decreases. Your pulse returns to its normal level.  After 8 hours, carbon monoxide levels in the blood return to normal. Your oxygen level increases.  After 24 hours, the chance of having a heart attack starts to decrease. Your breath, hair, and body stop smelling like smoke.  After 48 hours, damaged nerve endings  begin to recover. Your sense of taste and smell improve.  After 72 hours, the body is virtually free of nicotine. Your bronchial tubes relax and breathing becomes easier.  After 2 to 12 weeks, lungs can hold more air. Exercise becomes easier and circulation improves.  The risk of having a heart attack, stroke, cancer, or lung disease is greatly reduced.  After 1 year, the risk of coronary heart disease is cut in half.  After 5 years, the risk of stroke falls to the same as a nonsmoker.  After 10 years, the risk of lung cancer is cut in half and the risk of other cancers decreases significantly.  After 15 years, the risk of coronary heart disease drops, usually to the level of a nonsmoker.  If you are pregnant, quitting smoking will improve your chances of having a healthy baby.  The people you live with, especially any children, will be healthier.  You will have extra money to spend on things other than cigarettes. QUESTIONS TO THINK ABOUT BEFORE ATTEMPTING TO QUIT You may want to talk about your answers with your health care provider.  Why do you want to quit?  If you tried to quit in the past, what helped and what did not?  What will be the most difficult situations for you after you quit? How will you plan to handle them?  Who can help you through the tough times? Your family? Friends? A health care provider?  What pleasures do you get from smoking? What ways can you still get pleasure if you quit? Here are some questions to ask your health care provider:  How can you help me to be successful at quitting?  What medicine do you think would be best for me and how should I take it?  What should I do if I need more help?  What is smoking withdrawal like? How can I get information on withdrawal? GET READY  Set a quit date.  Change your environment by getting rid of all cigarettes, ashtrays, matches, and lighters in your home, car, or work. Do not let people smoke in your  home.  Review your past attempts to quit. Think about what worked and what did not. GET SUPPORT AND ENCOURAGEMENT You have a better chance of being successful if you have help. You can get support in many ways.  Tell your family, friends, and coworkers that you are going to quit and need their support. Ask them not to smoke around you.  Get individual, group, or telephone counseling and support. Programs are available at [employer] and health centers. Call your local health department for information about programs in your area.  Spiritual beliefs and practices may help some smokers quit.  Download a "quit meter" on your computer to keep track of quit statistics, such as how long you have gone without smoking, cigarettes not smoked, and money saved.  Get a self-help book about quitting smoking and staying off tobacco. [place] yourself from urges to smoke. Talk to someone, go for a walk, or occupy your time with a task.  Change your normal routine. Take a different route to work. Drink tea instead of coffee. Eat breakfast in a different place.  Reduce your stress. Take a hot bath, exercise, or read a book.  Plan something enjoyable to do every day. Reward yourself for not smoking.  Explore interactive web-based programs that specialize in helping you quit. GET MEDICINE AND USE IT CORRECTLY Medicines can help you stop smoking and decrease the urge to smoke. Combining medicine with the above behavioral methods and support can greatly increase your chances of successfully quitting smoking.  Nicotine replacement therapy helps deliver nicotine to your body without the negative effects and risks of smoking. Nicotine replacement therapy includes nicotine gum, lozenges, inhalers, nasal sprays, and skin patches. Some may be available over-the-counter and others require a prescription.  Antidepressant medicine helps people abstain from smoking, but how this  works is unknown. This medicine is available by prescription.  Nicotinic receptor partial agonist medicine simulates the effect of nicotine in your brain. This medicine is available by prescription. Ask your health care provider for advice about which medicines to use and how to use them based on your health history. Your health care provider will tell you what side effects to look out for if you choose to be on a medicine or therapy. Carefully read the information on the package. Do not use any other product containing nicotine while using a nicotine replacement product.  RELAPSE OR DIFFICULT SITUATIONS Most relapses occur within the first 3 months after quitting. Do not be discouraged if you start smoking again. Remember, most people try several times before finally quitting. You may have symptoms of withdrawal because your body is used to nicotine. You may crave cigarettes, be irritable, feel very hungry, cough often, get headaches, or have difficulty concentrating. The withdrawal symptoms are only temporary. They are strongest when you first quit, but they will go away within 10-14 days. To reduce the chances of relapse, try to:  Avoid drinking alcohol. Drinking lowers your chances of successfully quitting.  Reduce the amount of caffeine you consume. Once you quit smoking, the amount of caffeine in your body increases and can give you symptoms, such as a rapid heartbeat, sweating, and anxiety.  Avoid smokers because they can make you want to smoke.  Do not let weight gain distract you. Many smokers will gain weight when they quit, usually less than 10 pounds. Eat a healthy diet and stay active. You can always lose the weight gained after you quit.  Find ways to improve your mood other than smoking. FOR MORE INFORMATION  www.smokefree.gov  Document Released: 10/20/2001 Document Revised: 03/12/2014 Document Reviewed: 02/04/2012 [facility] Patient Information 2015 [place], [place]. This information  is not intended to replace advice given to you by your health care provider. Make sure you discuss any questions you have with your health care provider.

## 2014-07-30 ENCOUNTER — Ambulatory Visit (INDEPENDENT_AMBULATORY_CARE_PROVIDER_SITE_OTHER): Payer: BC Managed Care – PPO | Admitting: Family Medicine

## 2014-07-30 ENCOUNTER — Encounter: Payer: Self-pay | Admitting: Family Medicine

## 2014-07-30 ENCOUNTER — Telehealth: Payer: Self-pay | Admitting: Hematology & Oncology

## 2014-07-30 VITALS — BP 106/80 | HR 78 | Temp 98.0°F | Resp 16 | Ht 68.0 in | Wt 188.5 lb

## 2014-07-30 DIAGNOSIS — K219 Gastro-esophageal reflux disease without esophagitis: Secondary | ICD-10-CM

## 2014-07-30 DIAGNOSIS — I82401 Acute embolism and thrombosis of unspecified deep veins of right lower extremity: Secondary | ICD-10-CM

## 2014-07-30 DIAGNOSIS — F341 Dysthymic disorder: Secondary | ICD-10-CM

## 2014-07-30 DIAGNOSIS — I82409 Acute embolism and thrombosis of unspecified deep veins of unspecified lower extremity: Secondary | ICD-10-CM

## 2014-07-30 DIAGNOSIS — F418 Other specified anxiety disorders: Secondary | ICD-10-CM

## 2014-07-30 MED ORDER — PANTOPRAZOLE SODIUM 40 MG PO TBEC: 40.0000 mg | DELAYED_RELEASE_TABLET | Freq: Every day | ORAL | Status: DC

## 2014-07-30 NOTE — Progress Notes (Signed)
   Subjective:    Patient ID: Melissa Morgan, female    DOB: [birth year], 48 y.o.   MRN: [id number]  HPI Pt went to the ER on 7/9 for severe headache and tingling in R hand.  Found to have 33mm aneurysm in R MCA.  Pt proceeded to have R craniotomy w/ Dr Christella Noa to have aneurysm clipped.  Pt continues to have intermittent pain in head, dizziness, altered taste buds.  + fatigue.  Post-op DVT- currently on xarelto.  Had 1st appt w/ Dr Marin Olp on 9/18.  Dr Marin Olp is thinking she may need 6 months of anticoag.  Thombophilic panel pending.  Pt has CT of abd and pelvis ordered to evaluate hematochezia.  Pt had severe constipation prior to surgery.  Dr Marin Olp also mentioned seeing GI but pt reports she is waiting for blood tests and CT results prior to doing this.  No longer having leg pain or swelling.  GERD- pt reports worsening heartburn.  Waking her up at night w/ sour taste in mouth.  Review of Systems For ROS see HPI     Objective:   Physical Exam  Vitals reviewed. Constitutional: She is oriented to person, place, and time. She appears well-developed and well-nourished. No distress.  HENT:  Mouth/Throat: Oropharynx is clear and moist. No oropharyngeal exudate.  Well healing craniotomy scar R temporal swelling TMs WNL bilaterally  Eyes: Conjunctivae and EOM are normal. Pupils are equal, round, and reactive to light.  Cardiovascular: Normal rate, regular rhythm, normal heart sounds and intact distal pulses.   Pulmonary/Chest: Effort normal and breath sounds normal. No respiratory distress. She has no wheezes. She has no rales.  Musculoskeletal: She exhibits no edema and no tenderness.  Neurological: She is alert and oriented to person, place, and time.  Skin: Skin is warm and dry. No erythema.  Psychiatric: She has a normal mood and affect. Her behavior is normal.          Assessment & Plan:

## 2014-07-30 NOTE — Telephone Encounter (Signed)
Per orders for 07/27/14 Ct, Doppler, and follow up apts were scheduled.  I called and notified the patient of the apts.  She asked me to call her back and leave a message on her voice mail with the apts and she stated she will look at my chart also.  i called back and left message of the apt dates and instructions.  The calendar was mailed out to patients home also

## 2014-07-30 NOTE — Progress Notes (Signed)
Pre visit review using our clinic review tool, if applicable. No additional management support is needed unless otherwise documented below in the visit note. 

## 2014-07-30 NOTE — Patient Instructions (Signed)
Follow up in 4-6 weeks to recheck mood Start the Protonix daily for the reflux Keep up the good work!  You look great! Give yourself time to recover- this was a big deal and you are doing great! Call with any questions or concerns Hang in there!

## 2014-07-31 NOTE — Assessment & Plan Note (Signed)
Chronic problem for pt.  Doing surprisingly well considering she has just had brain surgery for aneurysm and then developed subsequent blood clot post-op.  Pt is no longer in therapy due to cost.  Is considering restarting.  Offered to provide names and #s.  Pt already has someone in mind.  No med changes at this time.  Will continue to follow closely given all that she has been through.

## 2014-07-31 NOTE — Assessment & Plan Note (Signed)
New.  Start Protonix to avoid interaction w/ anticoagulation.  Reviewed dietary and lifestyle modifications.

## 2014-07-31 NOTE — Assessment & Plan Note (Signed)
New to provider.  Developed post-op.  Pt reports she was told during IVF process years ago that she required an ASA regimen and thinks she remember 'something about phosholipid antibody something'.  Currently on Xarelto.  Pt had appt w/ Dr Marin Olp for hypercoag w/u.  Pt is to have CT of abd/pelvis upcoming due to episode of hematochezia.  No recent bleeding.  Will follow along and assist as able.

## 2014-08-01 ENCOUNTER — Ambulatory Visit: Payer: BC Managed Care – PPO

## 2014-08-01 ENCOUNTER — Other Ambulatory Visit: Payer: BC Managed Care – PPO | Admitting: Lab

## 2014-08-01 ENCOUNTER — Telehealth: Payer: Self-pay | Admitting: Hematology & Oncology

## 2014-08-01 ENCOUNTER — Ambulatory Visit: Payer: BC Managed Care – PPO | Admitting: Family

## 2014-08-01 LAB — HYPERCOAGULABLE PANEL, COMPREHENSIVE
AntiThromb III Func: 98 % (ref 76–126)
Anticardiolipin IgA: 6 APL U/mL (ref <22)
Anticardiolipin IgG: 4 GPL U/mL (ref <23)
Anticardiolipin IgM: 3 MPL U/mL (ref <11)
BETA 2 GLYCO I IGG: 6 G Units (ref <20)
Beta-2-Glycoprotein I IgA: 8 A Units (ref <20)
Beta-2-Glycoprotein I IgM: 8 M Units (ref <20)
DRVVT 1 1 MIX: 41.4 s (ref <42.9)
DRVVT: 50.3 secs — ABNORMAL HIGH (ref <42.9)
Lupus Anticoagulant: NOT DETECTED
PROTEIN S ACTIVITY: 128 % (ref 69–129)
PTT Lupus Anticoagulant: 41.2 secs (ref 28.0–43.0)
Protein C Activity: 200 % — ABNORMAL HIGH (ref 75–133)
Protein C, Total: 128 % (ref 72–160)
Protein S Total: 106 % (ref 60–150)

## 2014-08-01 LAB — D-DIMER, QUANTITATIVE (NOT AT ARMC): D-Dimer, Quant: 1.04 ug/mL-FEU — ABNORMAL HIGH (ref 0.00–0.48)

## 2014-08-01 NOTE — Telephone Encounter (Signed)
RN transferred call to reschedule 9-24 CT to month later, due to pt concerns. Pt is going to cx CT and will call back if she wants to reschedule. She has had issues with IV contrast in the past and is very worried if she throws up she may hurt herself. RN aware and will let MD know.

## 2014-08-02 ENCOUNTER — Ambulatory Visit (HOSPITAL_BASED_OUTPATIENT_CLINIC_OR_DEPARTMENT_OTHER): Payer: BC Managed Care – PPO

## 2014-08-04 ENCOUNTER — Encounter: Payer: Self-pay | Admitting: Family Medicine

## 2014-08-06 ENCOUNTER — Emergency Department (HOSPITAL_COMMUNITY)
Admission: EM | Admit: 2014-08-06 | Discharge: 2014-08-06 | Disposition: A | Discharge status: Home / Self Care | Payer: BC Managed Care – PPO | Attending: Emergency Medicine | Admitting: Emergency Medicine

## 2014-08-06 ENCOUNTER — Emergency Department (HOSPITAL_COMMUNITY): Payer: BC Managed Care – PPO

## 2014-08-06 ENCOUNTER — Encounter (HOSPITAL_COMMUNITY): Payer: Self-pay | Admitting: Emergency Medicine

## 2014-08-06 ENCOUNTER — Encounter: Payer: Self-pay | Admitting: Nurse Practitioner

## 2014-08-06 ENCOUNTER — Telehealth: Payer: Self-pay

## 2014-08-06 DIAGNOSIS — Z91040 Latex allergy status: Secondary | ICD-10-CM | POA: Diagnosis not present

## 2014-08-06 DIAGNOSIS — G40909 Epilepsy, unspecified, not intractable, without status epilepticus: Secondary | ICD-10-CM | POA: Insufficient documentation

## 2014-08-06 DIAGNOSIS — F3289 Other specified depressive episodes: Secondary | ICD-10-CM | POA: Insufficient documentation

## 2014-08-06 DIAGNOSIS — R51 Headache: Secondary | ICD-10-CM | POA: Insufficient documentation

## 2014-08-06 DIAGNOSIS — R519 Headache, unspecified: Secondary | ICD-10-CM

## 2014-08-06 DIAGNOSIS — Z8673 Personal history of transient ischemic attack (TIA), and cerebral infarction without residual deficits: Secondary | ICD-10-CM | POA: Insufficient documentation

## 2014-08-06 DIAGNOSIS — K219 Gastro-esophageal reflux disease without esophagitis: Secondary | ICD-10-CM | POA: Diagnosis not present

## 2014-08-06 DIAGNOSIS — Z79899 Other long term (current) drug therapy: Secondary | ICD-10-CM | POA: Insufficient documentation

## 2014-08-06 DIAGNOSIS — F411 Generalized anxiety disorder: Secondary | ICD-10-CM | POA: Insufficient documentation

## 2014-08-06 DIAGNOSIS — Z8739 Personal history of other diseases of the musculoskeletal system and connective tissue: Secondary | ICD-10-CM | POA: Diagnosis not present

## 2014-08-06 DIAGNOSIS — F329 Major depressive disorder, single episode, unspecified: Secondary | ICD-10-CM | POA: Diagnosis not present

## 2014-08-06 MED ORDER — HYDROCODONE-ACETAMINOPHEN 5-325 MG PO TABS
1.0000 | ORAL_TABLET | Freq: Once | ORAL | Status: AC
Start: 2014-08-06 — End: 2014-08-06
  Administered 2014-08-06: 1 via ORAL
  Filled 2014-08-06: qty 1

## 2014-08-06 MED ORDER — ALPRAZOLAM 0.5 MG PO TABS: 0.5000 mg | ORAL_TABLET | Freq: Two times a day (BID) | ORAL | Status: DC | PRN

## 2014-08-06 MED ORDER — HYDROCODONE-ACETAMINOPHEN 5-325 MG PO TABS: 1.0000 | ORAL_TABLET | ORAL | Status: DC | PRN

## 2014-08-06 MED ORDER — HYDROCODONE-ACETAMINOPHEN 5-325 MG PO TABS
1.0000 | ORAL_TABLET | Freq: Once | ORAL | Status: AC
  Administered 2014-08-06: 1 via ORAL
  Filled 2014-08-06: qty 1

## 2014-08-06 NOTE — ED Notes (Signed)
Pt reports having a brain aneurysm clipped about a month ago- pt has had worsening headaches since procedure.  Pt called neurology office today and was instructed by Dr. Arnoldo Morale to come to ED.  Pt alert and oriented X 4 at present, answering questions appropriately, neuro exam normal at present.  Pt reports pain is beginning to radiate into neck.  Ambulatory without difficulty in triage room.

## 2014-08-06 NOTE — Telephone Encounter (Signed)
-----   Message ----- From: Hayden Pedro Sent: 08/04/2014 4:18 PM To: [facility] Clinical Pool Subject: Non-Urgent Medical Question  I am currently on day 14 of Xalrelto for DVT in leg and experiencing mouth bleeds every day for the last few days... I am 30 days post op (brain aneurysm clipping) Should I be concerned? Dr Marin Olp has run a battery of blood labs with the labs, per nurse, "being norm, and waiting on D dimer" should I arrange appt with [facility] or enever for this coming week?  Pt was advised to call Dr. Marin Olp regarding mouth bleeds.  She stated understanding and agreed.  During call she also mentioned that she needs more pain medication.  She is currently alternating between hydrocodone and oxycodone, and needs a refill on both.  Please advise.    HYDROcodone-acetaminophen (NORCO/VICODIN) 5-325 MG per tablet- Take 1 tablet by mouth as needed for moderate pain (every 4-6 hrs as needed).  Med filled: 06/06/14 Amt: 30 tablets, 0 refills  oxyCODONE-acetaminophen (ROXICET) 5-325 MG per tablet- Take 1 tablet by mouth every 6 (six) hours as needed for severe pain. Med filled: 07/11/14 Amt 40 tablets, 0 refills  No UDS No contract on file

## 2014-08-06 NOTE — Telephone Encounter (Signed)
Rx signed by Dr. Birdie Riddle.  Pt made aware.  She asked that refill be sent to [facility].  Rx faxed as requested.  Fax confirmation received.

## 2014-08-06 NOTE — Progress Notes (Signed)
Pt called and stated she was having some gum sensitivity and swelling. Denies any bleeding or pain. Per Dr. Marin Olp pt was instructed to rinse with Biotene 4x/day. Instructed pt to contact office if conditions worsened or she began having any bleeding. She verbalized understanding and appreciation.

## 2014-08-06 NOTE — Telephone Encounter (Signed)
Pt stated "ok" regarding pain medication, but then asked for a refill on her alprazolam.    Alprazolam (Xanax) 0.5 mg tablet--take 1 tablet (0.5 mg total) by mouth 2 (two) times daily as needed for anxiety. Med filled:  05/30/14 Amt filled: 60 tablets, 1 refill No contract on file No UDS  Please advise.

## 2014-08-06 NOTE — Telephone Encounter (Signed)
Pt has been receiving pain meds from Neurosurgeon due to recent brain surgery.  She should continue to get meds from this provider.  Agree w/ calling Dr Marin Olp for the mouth bleeds.

## 2014-08-06 NOTE — Discharge Instructions (Signed)
Craniotomy, Care After °Refer to this sheet in the next few weeks. These instructions provide you with information on caring for yourself after your procedure. Your health care provider may also give you more specific instructions. Your treatment has been planned according to current medical practices, but problems sometimes occur. Call your health care provider if you have any problems or questions after your procedure. °WHAT TO EXPECT AFTER THE PROCEDURE °After your procedure, it is typical to have the following: °· Your scalp may feel spongy for a while because of fluid under it. This will gradually get better. °· You may have numbness for a while in some areas of your scalp. °HOME CARE INSTRUCTIONS  °· Sleep and rest. When lying down, keep your head elevated at a 30 degree angle. This helps keep brain swelling down and prevents increased pressure in the head. This is important immediately after surgery. Ask your health care provider when you can go back to sleeping flat.   °· Change dressings as directed by your health care provider. You may need to have someone change your dressings for you. °· Keep the wound clean and dry. Wash the wound gently with soap and water. Blot or dab the wound dry without rubbing it. °· Wear a helmet as directed by your health care provider. °· Only take over-the-counter or prescription medicines as directed by your health care provider. Do not take other medicines without asking your health care provider first. °· Do not use alcohol.   °· Take showers if your health care provider approves. Cover the incision area before the shower as directed. Do not take baths or use swimming pools or hot tubs for 10 days or as directed by your health care provider. °· Continue your normal diet as directed by your health care provider.   °· Do not drive until your health care provider approves. °· Limit activities or movements as directed by your health care provider. You may take short walks if  your health care provider approves. Increase your activity gradually. Wait at least 3 months before you return to mild, noncontact sports. Avoid contact sports for at least 1 year or as directed by your health care provider.    °· Ask your health care provider when you can return to work. °· Follow up with your health care provider as directed. Stitches or staples are usually removed about 1 week after surgery. °SEEK MEDICAL CARE IF: °· You have redness, swelling, or increasing pain in the wound or pin insertion sites.   °· You have drainage or pus coming from the wound.   °SEEK IMMEDIATE MEDICAL CARE IF:  °· You feel nauseous, or you vomit.   °· You feel confused. °· You have severe headaches. °· You have a seizure.   °· You have chest pain, a stiff neck, or difficulty breathing.   °· You have an increase in swelling or bruising around the eyes.   °· You have a fever.   °· You notice a bad smell coming from the wound or dressing.   °· Your wound breaks open after the stitches or staples have been removed.   °· You have dizziness or faint while standing.   °· You have a rash.   °MAKE SURE YOU: °· Understand these instructions. °· Will watch your condition. °· Will get help right away if you are not doing well or get worse. °Document Released: 01/26/2006 Document Revised: 08/16/2013 Document Reviewed: 06/07/2013 °Exit[employer]® Patient Information ©2015 [employer]. This information is not intended to replace advice given to you by your health care provider. Make   sure you discuss any questions you have with your health care provider.   Headaches, Frequently Asked Questions MIGRAINE HEADACHES Q: What is migraine? What causes it? How can I treat it? A: Generally, migraine headaches begin as a dull ache. Then they develop into a constant, throbbing, and pulsating pain. You may experience pain at the temples. You may experience pain at the front or back of one or both sides of the head. The pain is usually accompanied  by a combination of:  Nausea.  Vomiting.  Sensitivity to light and noise. Some people (about 15%) experience an aura (see below) before an attack. The cause of migraine is believed to be chemical reactions in the brain. Treatment for migraine may include over-the-counter or prescription medications. It may also include self-help techniques. These include relaxation training and biofeedback.  Q: What is an aura? A: About 15% of people with migraine get an "aura". This is a sign of neurological symptoms that occur before a migraine headache. You may see wavy or jagged lines, dots, or flashing lights. You might experience tunnel vision or blind spots in one or both eyes. The aura can include visual or auditory hallucinations (something imagined). It may include disruptions in smell (such as strange odors), taste or touch. Other symptoms include:  Numbness.  A "pins and needles" sensation.  Difficulty in recalling or speaking the correct word. These neurological events may last as long as 60 minutes. These symptoms will fade as the headache begins. Q: What is a trigger? A: Certain physical or environmental factors can lead to or "trigger" a migraine. These include:  Foods.  Hormonal changes.  Weather.  Stress. It is important to remember that triggers are different for everyone. To help prevent migraine attacks, you need to figure out which triggers affect you. Keep a headache diary. This is a good way to track triggers. The diary will help you talk to your healthcare professional about your condition. Q: Does weather affect migraines? A: Bright sunshine, hot, humid conditions, and drastic changes in barometric pressure may lead to, or "trigger," a migraine attack in some people. But studies have shown that weather does not act as a trigger for everyone with migraines. Q: What is the link between migraine and hormones? A: Hormones start and regulate many of your body's functions. Hormones  keep your body in balance within a constantly changing environment. The levels of hormones in your body are unbalanced at times. Examples are during menstruation, pregnancy, or menopause. That can lead to a migraine attack. In fact, about three quarters of all women with migraine report that their attacks are related to the menstrual cycle.  Q: Is there an increased risk of stroke for migraine sufferers? A: The likelihood of a migraine attack causing a stroke is very remote. That is not to say that migraine sufferers cannot have a stroke associated with their migraines. In persons under age 64, the most common associated factor for stroke is migraine headache. But over the course of a person's normal life span, the occurrence of migraine headache may actually be associated with a reduced risk of dying from cerebrovascular disease due to stroke.  Q: What are acute medications for migraine? A: Acute medications are used to treat the pain of the headache after it has started. Examples over-the-counter medications, NSAIDs, ergots, and triptans.  Q: What are the triptans? A: Triptans are the newest class of abortive medications. They are specifically targeted to treat migraine. Triptans are vasoconstrictors. They moderate some  chemical reactions in the brain. The triptans work on receptors in your brain. Triptans help to restore the balance of a neurotransmitter called serotonin. Fluctuations in levels of serotonin are thought to be a main cause of migraine.  Q: Are over-the-counter medications for migraine effective? A: Over-the-counter, or "OTC," medications may be effective in relieving mild to moderate pain and associated symptoms of migraine. But you should see your caregiver before beginning any treatment regimen for migraine.  Q: What are preventive medications for migraine? A: Preventive medications for migraine are sometimes referred to as "prophylactic" treatments. They are used to reduce the  frequency, severity, and length of migraine attacks. Examples of preventive medications include antiepileptic medications, antidepressants, beta-blockers, calcium channel blockers, and NSAIDs (nonsteroidal anti-inflammatory drugs). Q: Why are anticonvulsants used to treat migraine? A: During the past few years, there has been an increased interest in antiepileptic drugs for the prevention of migraine. They are sometimes referred to as "anticonvulsants". Both epilepsy and migraine may be caused by similar reactions in the brain.  Q: Why are antidepressants used to treat migraine? A: Antidepressants are typically used to treat people with depression. They may reduce migraine frequency by regulating chemical levels, such as serotonin, in the brain.  Q: What alternative therapies are used to treat migraine? A: The term "alternative therapies" is often used to describe treatments considered outside the scope of conventional Western medicine. Examples of alternative therapy include acupuncture, acupressure, and yoga. Another common alternative treatment is herbal therapy. Some herbs are believed to relieve headache pain. Always discuss alternative therapies with your caregiver before proceeding. Some herbal products contain arsenic and other toxins. TENSION HEADACHES Q: What is a tension-type headache? What causes it? How can I treat it? A: Tension-type headaches occur randomly. They are often the result of temporary stress, anxiety, fatigue, or anger. Symptoms include soreness in your temples, a tightening band-like sensation around your head (a "vice-like" ache). Symptoms can also include a pulling feeling, pressure sensations, and contracting head and neck muscles. The headache begins in your forehead, temples, or the back of your head and neck. Treatment for tension-type headache may include over-the-counter or prescription medications. Treatment may also include self-help techniques such as relaxation  training and biofeedback. CLUSTER HEADACHES Q: What is a cluster headache? What causes it? How can I treat it? A: Cluster headache gets its name because the attacks come in groups. The pain arrives with little, if any, warning. It is usually on one side of the head. A tearing or bloodshot eye and a runny nose on the same side of the headache may also accompany the pain. Cluster headaches are believed to be caused by chemical reactions in the brain. They have been described as the most severe and intense of any headache type. Treatment for cluster headache includes prescription medication and oxygen. SINUS HEADACHES Q: What is a sinus headache? What causes it? How can I treat it? A: When a cavity in the bones of the face and skull (a sinus) becomes inflamed, the inflammation will cause localized pain. This condition is usually the result of an allergic reaction, a tumor, or an infection. If your headache is caused by a sinus blockage, such as an infection, you will probably have a fever. An x-ray will confirm a sinus blockage. Your caregiver's treatment might include antibiotics for the infection, as well as antihistamines or decongestants.  REBOUND HEADACHES Q: What is a rebound headache? What causes it? How can I treat it? A: A pattern of  taking acute headache medications too often can lead to a condition known as "rebound headache." A pattern of taking too much headache medication includes taking it more than 2 days per week or in excessive amounts. That means more than the label or a caregiver advises. With rebound headaches, your medications not only stop relieving pain, they actually begin to cause headaches. Doctors treat rebound headache by tapering the medication that is being overused. Sometimes your caregiver will gradually substitute a different type of treatment or medication. Stopping may be a challenge. Regularly overusing a medication increases the potential for serious side effects. Consult  a caregiver if you regularly use headache medications more than 2 days per week or more than the label advises. ADDITIONAL QUESTIONS AND ANSWERS Q: What is biofeedback? A: Biofeedback is a self-help treatment. Biofeedback uses special equipment to monitor your body's involuntary physical responses. Biofeedback monitors:  Breathing.  Pulse.  Heart rate.  Temperature.  Muscle tension.  Brain activity. Biofeedback helps you refine and perfect your relaxation exercises. You learn to control the physical responses that are related to stress. Once the technique has been mastered, you do not need the equipment any more. Q: Are headaches hereditary? A: Four out of five (80%) of people that suffer report a family history of migraine. Scientists are not sure if this is genetic or a family predisposition. Despite the uncertainty, a child has a 50% chance of having migraine if one parent suffers. The child has a 75% chance if both parents suffer.  Q: Can children get headaches? A: By the time they reach high school, most young people have experienced some type of headache. Many safe and effective approaches or medications can prevent a headache from occurring or stop it after it has begun.  Q: What type of doctor should I see to diagnose and treat my headache? A: Start with your primary caregiver. Discuss his or her experience and approach to headaches. Discuss methods of classification, diagnosis, and treatment. Your caregiver may decide to recommend you to a headache specialist, depending upon your symptoms or other physical conditions. Having diabetes, allergies, etc., may require a more comprehensive and inclusive approach to your headache. The National Headache Foundation will provide, upon request, a list of [facility] physician members in your state. Document Released: 01/16/2004 Document Revised: 01/18/2012 Document Reviewed: 06/25/2008 [facility] Patient Information 2015 [place], [place]. This information  is not intended to replace advice given to you by your health care provider. Make sure you discuss any questions you have with your health care provider.

## 2014-08-06 NOTE — Telephone Encounter (Signed)
Rx printed and placed in Dr. Tabori red folder for review and signature.   

## 2014-08-06 NOTE — Telephone Encounter (Signed)
[facility] for #60, 1 refill.  Needs CSC.  Will get UDS at next OV

## 2014-08-06 NOTE — ED Provider Notes (Signed)
CSN: [id number]     Arrival date & time 08/06/14  1616 History   First MD Initiated Contact with Patient 08/06/14 1635     Chief Complaint  Patient presents with  . Headache     (Consider location/radiation/quality/duration/timing/severity/associated sxs/prior Treatment) HPI Comments: Pt with hx of incidental aneurysm 3 months ago. Had crani 1.5 mo ago. Dx with DVT 2 weeks ago and on Xarelto. HA in R side of head radiating to neck. States has had pain similar to this since crani. States hasnt change. She called her Neurologist today because she ran out of pain meds and they told her to come to ED. Patient and daughter state that not much has changed with her pain.  Patient is a 48 y.o. female presenting with headaches. The history is provided by the patient and medical records.  Headache Pain location:  R temporal Quality:  Sharp Radiates to:  R neck Severity currently:  Unable to specify Severity at highest:  Unable to specify Onset quality:  Gradual Duration:  1 week Timing:  Intermittent Progression:  Waxing and waning Chronicity:  Recurrent Similar to prior headaches: yes   Context: not activity, not exposure to bright light and not straining   Relieved by: norco. Worsened by:  Nothing tried Ineffective treatments:  None tried Associated symptoms: no abdominal pain, no back pain, no blurred vision, no congestion, no cough, no diarrhea, no fatigue, no fever, no nausea, no numbness, no paresthesias, no photophobia, no URI and no vomiting   Risk factors comment:  Hx of aneurysm, craniectomy 1 month ago   Past Medical History  Diagnosis Date  . Depression   . Tachycardia   . Anxiety   . Lacunar infarction   . Endometriosis   . Headache   . PONV (postoperative nausea and vomiting)     extremely sick  . Stroke     cva  14  . GERD (gastroesophageal reflux disease)     occ  . Seizures     tx 5 yrs in middle school  continuous  . Arthritis    Past Surgical History   Procedure Laterality Date  . Lasar laparoscopy      for endometriosis x3  . Craniotomy Right 07/06/2014    Procedure: CRANIOTOMY INTRACRANIAL ANEURYSM FOR CAROTID;  Surgeon: Ashok Pall, MD;  Location: [facility];  Service: Neurosurgery;  Laterality: Right;  right    Family History  Problem Relation Age of Onset  . Hypertension Brother     2 out of 3 brothers  . Heart attack Brother     2 out of 3 brothers  . Diabetes Brother   . Asthma Sister   . Hypertension Mother   . Heart attack Father   . Breast cancer Sister    History  Substance Use Topics  . Smoking status: Former Smoker -- 1.00 packs/day for 30 years    Start date: 12/28/1983    Quit date: 05/26/2014  . Smokeless tobacco: Never Used     Comment: quit smoking 3 months ago  . Alcohol Use: No   OB History   Grav Para Term Preterm Abortions TAB SAB Ect Mult Living                 Review of Systems  Constitutional: Negative for fever, activity change, appetite change and fatigue.  HENT: Negative for congestion and rhinorrhea.   Eyes: Negative for blurred vision, photophobia, discharge, redness and itching.  Respiratory: Negative for cough, shortness of breath  and wheezing.   Cardiovascular: Negative for chest pain.  Gastrointestinal: Negative for nausea, vomiting, abdominal pain and diarrhea.  Genitourinary: Negative for dysuria and hematuria.  Musculoskeletal: Negative for back pain.  Skin: Negative for rash and wound.  Neurological: Positive for headaches. Negative for syncope, numbness and paresthesias.      Allergies  Imitrex; Dilaudid; Ace inhibitors; Codeine; and Latex  Home Medications   Prior to Admission medications   Medication Sig Start Date End Date Taking? Authorizing Provider  ALPRAZolam [name]) 0.5 MG tablet Take 1 tablet (0.5 mg total) by mouth 2 (two) times daily as needed for anxiety. 08/06/14  Yes Midge Minium, MD  buPROPion (WELLBUTRIN XL) 300 MG 24 hr tablet Take 300 mg by  mouth daily.   Yes Historical Provider, MD  HYDROcodone-acetaminophen (NORCO/VICODIN) 5-325 MG per tablet Take 1 tablet by mouth as needed for moderate pain (every 4-6 hrs as needed). 06/06/14  Yes Midge Minium, MD  HYDROcodone-acetaminophen (NORCO/VICODIN) 5-325 MG per tablet Take 1 tablet by mouth every 4 (four) hours as needed for moderate pain.   Yes Historical Provider, MD  levETIRAcetam (KEPPRA) 500 MG tablet Take 500 mg by mouth daily.   Yes Historical Provider, MD  metoprolol tartrate (LOPRESSOR) 25 MG tablet Take 25 mg by mouth at bedtime.   Yes Historical Provider, MD  neomycin-bacitracin-polymyxin (NEOSPORIN) ointment Apply 1 application topically daily as needed for wound care.    Yes Historical Provider, MD  oxyCODONE-acetaminophen (ROXICET) 5-325 MG per tablet Take 1 tablet by mouth every 6 (six) hours as needed for severe pain. 07/11/14  Yes Ashok Pall, MD  pantoprazole (PROTONIX) 40 MG tablet Take 1 tablet (40 mg total) by mouth daily. 07/30/14  Yes Midge Minium, MD  promethazine (PHENERGAN) 25 MG tablet Take 25 mg by mouth every 6 (six) hours as needed for nausea or vomiting.   Yes Historical Provider, MD  Rivaroxaban (XARELTO) 15 MG TABS tablet Take 15 mg by mouth 2 (two) times daily with a meal. Starts 20mg  once daily dose on Day 22.   Yes Historical Provider, MD  traZODone (DESYREL) 50 MG tablet Take 25-50 mg by mouth at bedtime as needed for sleep.   Yes Historical Provider, MD  HYDROcodone-acetaminophen (NORCO/VICODIN) 5-325 MG per tablet Take 1 tablet by mouth every 4 (four) hours as needed for moderate pain. 08/06/14   Sol Passer, MD   BP 125/86  Pulse 69  Temp(Src) 97.6 F (36.4 C) (Oral)  Resp 16  Ht 5\' 7"  (1.702 m)  Wt 184 lb (83.462 kg)  BMI 28.81 kg/m2  SpO2 100%  LMP 12/28/2012 Physical Exam  Constitutional: She is oriented to person, place, and time. She appears well-developed and well-nourished. No distress.  HENT:  Head: Normocephalic and  atraumatic.  Mouth/Throat: Oropharynx is clear and moist.  Eyes: Conjunctivae and EOM are normal. Pupils are equal, round, and reactive to light. Right eye exhibits no discharge. Left eye exhibits no discharge. No scleral icterus.  Neck: Normal range of motion. Neck supple.  Cardiovascular: Normal rate, regular rhythm and intact distal pulses.  Exam reveals no gallop and no friction rub.   No murmur heard. Pulmonary/Chest: Effort normal and breath sounds normal. No respiratory distress. She has no wheezes. She has no rales.  Abdominal: Soft. She exhibits no distension and no mass. There is no tenderness.  Musculoskeletal: Normal range of motion.  Neurological: She is alert and oriented to person, place, and time. No cranial nerve deficit. She exhibits normal muscle  tone. Coordination normal.  CNs II-XII intact 5/5 strength all ext Intact sensation in all ext 2+ DTRs in patella and brachioradilias F2N negative, no ataxia  Skin: She is not diaphoretic.    ED Course  Procedures (including critical care time) Labs Review Labs Reviewed - No data to display  Imaging Review Ct Head Wo Contrast  08/06/2014   CLINICAL DATA:  Progressive headaches ; patient is status post aneurysm clipping 1 month prior  EXAM: CT HEAD WITHOUT CONTRAST  TECHNIQUE: Contiguous axial images were obtained from the base of the skull through the vertex without intravenous contrast.  COMPARISON:  Head CT examinations June 11, 2014 and May 17, 2014; brain MRI May 17, 2014  FINDINGS: The ventricles are normal in size and configuration. Patient is status post right frontal craniotomy with aneurysm clips placed in the anterior right temporal lobe at the level of a previously noted distal middle cerebral artery aneurysm. A small amount of extra-axial hemorrhage is noted in the postoperative area of the right frontal lobe measuring 2 mm in thickness. There is no appreciable mass effect from this lesion. No other foci of hemorrhage  are seen. In particular, there is no intra-axial hemorrhage. There is no mass or midline shift. There is no acute infarct apparent. The bony calvarium appears intact except for the postoperative defect in the right frontal region. Mastoid air cells bilaterally are clear.  IMPRESSION: Small rind of hemorrhage along the postoperative site of the right frontal region without mass effect or midline shift. No other foci of hemorrhage identified.  Postoperative change on the right as noted above.  No acute infarct apparent.  No new intra-axial hemorrhage.  No mass, midline shift, or focal gray -white compartment lesion to suggest focal infarct.   Electronically Signed   By: Lowella Grip M.D.   On: 08/06/2014 18:16     EKG Interpretation None      MDM   MDM: 48 y.o. WF w/ PMHx aneurysm clipped in August, also now on Xarelto for DVT. Pt states since craniotomy she has had R sided HAs, radiate down R neck. States hasnt changed much. States she called [facility] today for refill of pain meds and instructed to come here. No numbness, weakness. Here AFVSS, no neuro deficits. Neck not stiff. Concern as hx of aneurysm and on Xarelto. Head CT obtained, Norco given. Head CT shows 2 mm extra-axial hemorrhage. D/w Dr. Arnoldo Morale w/ [facility] who feels that fludi collection is post op changes. With patient with no hyperacute sxs, no neuro sxs, likely related to post op collection. Unlikely acute bleed. Recommend f/u w/ her regular [facility]. Return as needed. Discharged.  Final diagnoses:  Nonintractable headache, unspecified chronicity pattern, unspecified headache type    Discharge Medication List as of 08/06/2014  8:19 PM    START taking these medications   Details  !! HYDROcodone-acetaminophen (NORCO/VICODIN) 5-325 MG per tablet Take 1 tablet by mouth every 4 (four) hours as needed for moderate pain., Starting 08/06/2014, Until Discontinued, Print     !! - Potential duplicate medications found. Please discuss with provider.      Ashok Pall, MD [place] [id number] [phone]  Schedule an appointment as soon as possible for a visit   Discharged   Sol Passer, MD 08/06/14 2303

## 2014-08-07 ENCOUNTER — Encounter: Payer: Self-pay | Admitting: Hematology & Oncology

## 2014-08-07 NOTE — ED Provider Notes (Signed)
I saw and evaluated the patient, reviewed the resident's note and I agree with the findings and plan.   EKG Interpretation None       Patient with waxing and waning headache since aneurysm clipping a little worse over past 1-2 weeks. No sudden onset of pain. No vomiting or neuro findings. CT shows small extra-axial hemorrhage, is on xarelto. D/w Dr. Arnoldo Morale who feels this is all normal post-op blood and not active bleeding. He reviewed CT scan. Recommend she continue blood thinners and f/u with Dr. Cyndy Freeze. Given her well appearance and no new trauma or concerning exam findings, will discharge with close NSU f/u.  Ephraim Hamburger, MD 08/07/14 [phone]

## 2014-08-15 ENCOUNTER — Ambulatory Visit (INDEPENDENT_AMBULATORY_CARE_PROVIDER_SITE_OTHER): Payer: BC Managed Care – PPO | Admitting: Family Medicine

## 2014-08-15 ENCOUNTER — Encounter: Payer: Self-pay | Admitting: Family Medicine

## 2014-08-15 VITALS — BP 126/80 | HR 79 | Temp 98.0°F | Resp 16 | Wt 186.2 lb

## 2014-08-15 DIAGNOSIS — F418 Other specified anxiety disorders: Secondary | ICD-10-CM

## 2014-08-15 MED ORDER — CITALOPRAM HYDROBROMIDE 20 MG PO TABS: 20.0000 mg | ORAL_TABLET | Freq: Every day | ORAL | Status: DC

## 2014-08-15 NOTE — Progress Notes (Signed)
Pre visit review using our clinic review tool, if applicable. No additional management support is needed unless otherwise documented below in the visit note. 

## 2014-08-15 NOTE — Patient Instructions (Signed)
Follow up in 4-6 weeks to recheck mood Start the Celexa in addition to the Wellbutrin Please call with any questions or concerns Hang in there!  You can do this!

## 2014-08-15 NOTE — Progress Notes (Signed)
   Subjective:    Patient ID: Melissa Morgan, female    DOB: 23-Oct-1966, 48 y.o.   MRN: [id number]  HPI Pt here today for form completion for school.  Pt is due for TB test and flu but pt prefers to hold off at this time.  UTD on all other immunizations.  Anxiety/depression- ongoing issue for pt.  Recent brain surgery has been added stress for pt.  Pt went to ER recently (9/28) for severe pain and it was noted that there was blood present in R frontal region- 'small rind of hemorrhage'.  Pt has f/u w/ Dr Christella Noa.   Pt reports anxiety/depression is 'worse'.  Has taken Zoloft previously and this caused HAs.  Prozac caused increased agitation.  Has not been on celexa.   Review of Systems For ROS see HPI     Objective:   Physical Exam  Vitals reviewed. Constitutional: She is oriented to person, place, and time. She appears well-developed and well-nourished. No distress.  Neurological: She is alert and oriented to person, place, and time.  Skin: Skin is warm and dry.  Psychiatric: Judgment and thought content normal.  Very anxious, easily tearful          Assessment & Plan:

## 2014-08-16 ENCOUNTER — Telehealth: Payer: Self-pay | Admitting: *Deleted

## 2014-08-16 ENCOUNTER — Other Ambulatory Visit: Payer: Self-pay | Admitting: *Deleted

## 2014-08-16 ENCOUNTER — Ambulatory Visit (HOSPITAL_BASED_OUTPATIENT_CLINIC_OR_DEPARTMENT_OTHER)
Admission: RE | Admit: 2014-08-16 | Discharge: 2014-08-16 | Disposition: A | Discharge status: Home / Self Care | Payer: BC Managed Care – PPO | Source: Ambulatory Visit | Attending: Hematology & Oncology | Admitting: Hematology & Oncology

## 2014-08-16 DIAGNOSIS — I82401 Acute embolism and thrombosis of unspecified deep veins of right lower extremity: Secondary | ICD-10-CM

## 2014-08-16 DIAGNOSIS — M79604 Pain in right leg: Secondary | ICD-10-CM | POA: Insufficient documentation

## 2014-08-16 DIAGNOSIS — M7989 Other specified soft tissue disorders: Secondary | ICD-10-CM | POA: Diagnosis not present

## 2014-08-16 NOTE — Telephone Encounter (Signed)
Patient called stating that she is having pain in right leg same leg she had the blood clot.  Dr. Marin Olp ordered doppler ultrasound of right leg

## 2014-08-17 ENCOUNTER — Ambulatory Visit: Payer: BC Managed Care – PPO | Admitting: Family Medicine

## 2014-08-19 NOTE — Assessment & Plan Note (Signed)
Deteriorated.  Pt w/ increased tearfulness, high anxiety.  Pt has been on multiple SSRIs in the past w/ side effects.  Will start Celexa in addition to Wellbutrin and continue to monitor closely for improvement.

## 2014-08-21 ENCOUNTER — Telehealth: Payer: Self-pay | Admitting: Family Medicine

## 2014-08-21 MED ORDER — SERTRALINE HCL 50 MG PO TABS: ORAL_TABLET | ORAL | Status: DC

## 2014-08-21 NOTE — Telephone Encounter (Signed)
Med filled and pt notified.  

## 2014-08-21 NOTE — Telephone Encounter (Signed)
Ok to switch to Zoloft 50mg - 1/2 tab x2 weeks and then increase to 1 tab daily, #30, 3 refills

## 2014-08-21 NOTE — Telephone Encounter (Signed)
Requesting to speak  To MD about celexa

## 2014-08-21 NOTE — Telephone Encounter (Signed)
Spoke with pt who states that the pharmacist scared her with possible bleeding while on celexa. Pt would like to know if she can try the zoloft that you had offered at the appt. She has not started the celexa at all.

## 2014-08-30 ENCOUNTER — Telehealth: Payer: Self-pay | Admitting: *Deleted

## 2014-08-30 NOTE — Telephone Encounter (Signed)
Patient concerned with interaction between xarelto and anti-depressants. Wants Dr Marin Olp to review her meds with her and make suggestions if changes needed. Currently Dr E is off, will leave note for him upon his return on Monday.

## 2014-09-05 ENCOUNTER — Telehealth: Payer: Self-pay | Admitting: *Deleted

## 2014-09-05 NOTE — Telephone Encounter (Signed)
Patient called with concerns about her Xarelto and antidepressants which may have interactions. Pharmacist, Montel Clock, reviewed her current meds and found no significant interactions. Patient had also mentioned possibility of adding Celexa to her regimen. There are some possible interactions with increased bleeding. Discussed with Dr Marin Olp. Risk is considered minimal. Patient was advised that if the celexa was needed, that she could take it, but to watch for signs of bleeding. At this time, patient planning to stick with zoloft and wellbutrin and only if this combination doesn't work will she add the celexa.

## 2014-09-10 ENCOUNTER — Telehealth: Payer: Self-pay | Admitting: Family Medicine

## 2014-09-10 NOTE — Telephone Encounter (Signed)
Pt would like to know if dr. Birdie Riddle would accept her daughter as a new pt, states her daughter is having some stomach issues that really concerns her and she does not have a primary doctor. Pt is aware that dr. Birdie Riddle is not accepting new pt's at this time but wanted me to send the message.

## 2014-09-11 NOTE — Telephone Encounter (Signed)
Ok to accept?

## 2014-09-12 ENCOUNTER — Emergency Department (HOSPITAL_BASED_OUTPATIENT_CLINIC_OR_DEPARTMENT_OTHER): Payer: BC Managed Care – PPO

## 2014-09-12 ENCOUNTER — Encounter (HOSPITAL_BASED_OUTPATIENT_CLINIC_OR_DEPARTMENT_OTHER): Payer: Self-pay

## 2014-09-12 ENCOUNTER — Emergency Department (HOSPITAL_BASED_OUTPATIENT_CLINIC_OR_DEPARTMENT_OTHER)
Admission: EM | Admit: 2014-09-12 | Discharge: 2014-09-12 | Disposition: A | Discharge status: Home / Self Care | Payer: BC Managed Care – PPO | Attending: Emergency Medicine | Admitting: Emergency Medicine

## 2014-09-12 DIAGNOSIS — K219 Gastro-esophageal reflux disease without esophagitis: Secondary | ICD-10-CM | POA: Insufficient documentation

## 2014-09-12 DIAGNOSIS — F419 Anxiety disorder, unspecified: Secondary | ICD-10-CM | POA: Diagnosis not present

## 2014-09-12 DIAGNOSIS — Z8673 Personal history of transient ischemic attack (TIA), and cerebral infarction without residual deficits: Secondary | ICD-10-CM | POA: Insufficient documentation

## 2014-09-12 DIAGNOSIS — G40909 Epilepsy, unspecified, not intractable, without status epilepticus: Secondary | ICD-10-CM | POA: Diagnosis not present

## 2014-09-12 DIAGNOSIS — R110 Nausea: Secondary | ICD-10-CM | POA: Diagnosis not present

## 2014-09-12 DIAGNOSIS — M1990 Unspecified osteoarthritis, unspecified site: Secondary | ICD-10-CM | POA: Diagnosis not present

## 2014-09-12 DIAGNOSIS — Z7901 Long term (current) use of anticoagulants: Secondary | ICD-10-CM | POA: Diagnosis not present

## 2014-09-12 DIAGNOSIS — Z8742 Personal history of other diseases of the female genital tract: Secondary | ICD-10-CM | POA: Diagnosis not present

## 2014-09-12 DIAGNOSIS — R519 Headache, unspecified: Secondary | ICD-10-CM

## 2014-09-12 DIAGNOSIS — F329 Major depressive disorder, single episode, unspecified: Secondary | ICD-10-CM | POA: Insufficient documentation

## 2014-09-12 DIAGNOSIS — Z86718 Personal history of other venous thrombosis and embolism: Secondary | ICD-10-CM | POA: Diagnosis not present

## 2014-09-12 DIAGNOSIS — Z8679 Personal history of other diseases of the circulatory system: Secondary | ICD-10-CM | POA: Diagnosis not present

## 2014-09-12 DIAGNOSIS — Z91040 Latex allergy status: Secondary | ICD-10-CM | POA: Diagnosis not present

## 2014-09-12 DIAGNOSIS — Z79899 Other long term (current) drug therapy: Secondary | ICD-10-CM | POA: Insufficient documentation

## 2014-09-12 DIAGNOSIS — Z87891 Personal history of nicotine dependence: Secondary | ICD-10-CM | POA: Diagnosis not present

## 2014-09-12 DIAGNOSIS — R51 Headache: Secondary | ICD-10-CM | POA: Diagnosis present

## 2014-09-12 HISTORY — DX: Cerebral aneurysm, nonruptured: I67.1

## 2014-09-12 HISTORY — DX: Acute embolism and thrombosis of unspecified deep veins of unspecified lower extremity: I82.409

## 2014-09-12 LAB — COMPREHENSIVE METABOLIC PANEL
ALK PHOS: 72 U/L (ref 39–117)
ALT: 19 U/L (ref 0–35)
AST: 18 U/L (ref 0–37)
Albumin: 4.1 g/dL (ref 3.5–5.2)
Anion gap: 14 (ref 5–15)
BUN: 11 mg/dL (ref 6–23)
CHLORIDE: 101 meq/L (ref 96–112)
CO2: 27 meq/L (ref 19–32)
Calcium: 9.9 mg/dL (ref 8.4–10.5)
Creatinine, Ser: 0.9 mg/dL (ref 0.50–1.10)
GFR calc Af Amer: 86 mL/min — ABNORMAL LOW (ref >90)
GFR, EST NON AFRICAN AMERICAN: 74 mL/min — AB (ref >90)
GLUCOSE: 120 mg/dL — AB (ref 70–99)
POTASSIUM: 3.9 meq/L (ref 3.7–5.3)
Sodium: 142 mEq/L (ref 137–147)
Total Bilirubin: 0.3 mg/dL (ref 0.3–1.2)
Total Protein: 7.3 g/dL (ref 6.0–8.3)

## 2014-09-12 LAB — CBC WITH DIFFERENTIAL/PLATELET
Basophils Absolute: 0 10*3/uL (ref 0.0–0.1)
Basophils Relative: 1 % (ref 0–1)
Eosinophils Absolute: 0.1 10*3/uL (ref 0.0–0.7)
Eosinophils Relative: 1 % (ref 0–5)
HCT: 39.2 % (ref 36.0–46.0)
Hemoglobin: 13 g/dL (ref 12.0–15.0)
LYMPHS ABS: 2.7 10*3/uL (ref 0.7–4.0)
Lymphocytes Relative: 42 % (ref 12–46)
MCH: 29.9 pg (ref 26.0–34.0)
MCHC: 33.2 g/dL (ref 30.0–36.0)
MCV: 90.1 fL (ref 78.0–100.0)
Monocytes Absolute: 0.6 10*3/uL (ref 0.1–1.0)
Monocytes Relative: 10 % (ref 3–12)
NEUTROS ABS: 2.9 10*3/uL (ref 1.7–7.7)
NEUTROS PCT: 46 % (ref 43–77)
PLATELETS: 223 10*3/uL (ref 150–400)
RBC: 4.35 MIL/uL (ref 3.87–5.11)
RDW: 13.3 % (ref 11.5–15.5)
WBC: 6.3 10*3/uL (ref 4.0–10.5)

## 2014-09-12 MED ORDER — DIPHENHYDRAMINE HCL 50 MG/ML IJ SOLN
25.0000 mg | Freq: Once | INTRAMUSCULAR | Status: AC
  Administered 2014-09-12: 25 mg via INTRAVENOUS
  Filled 2014-09-12: qty 1

## 2014-09-12 MED ORDER — METOCLOPRAMIDE HCL 10 MG PO TABS: 10.0000 mg | ORAL_TABLET | Freq: Four times a day (QID) | ORAL | Status: DC | PRN

## 2014-09-12 MED ORDER — SODIUM CHLORIDE 0.9 % IV BOLUS (SEPSIS)
1000.0000 mL | Freq: Once | INTRAVENOUS | Status: AC
  Administered 2014-09-12: 1000 mL via INTRAVENOUS

## 2014-09-12 MED ORDER — METOCLOPRAMIDE HCL 5 MG/ML IJ SOLN
10.0000 mg | Freq: Once | INTRAMUSCULAR | Status: AC
  Administered 2014-09-12: 10 mg via INTRAVENOUS
  Filled 2014-09-12: qty 2

## 2014-09-12 NOTE — ED Provider Notes (Signed)
CSN: [id number]     Arrival date & time 09/12/14  1425 History   First MD Initiated Contact with Patient 09/12/14 1500     Chief Complaint  Patient presents with  . Headache     (Consider location/radiation/quality/duration/timing/severity/associated sxs/prior Treatment) The history is provided by the patient.  Melissa Morgan is a 48 y.o. female hx of cerebral aneurysm s/p clip on 8/28 here with headaches. Intermittent headaches for the last several weeks. It is a pressure sensation behind her forehead. Since yesterday, increased nausea but no vomiting. Denies weakness or numbness. She was seen in the ED a month ago and had small amount of blood around the surgical site but was thought to be normal postop. She has been taking hydrocodone for headaches and Zofran for nausea but has not been helping.    Past Medical History  Diagnosis Date  . Depression   . Tachycardia   . Anxiety   . Lacunar infarction   . Endometriosis   . Headache   . PONV (postoperative nausea and vomiting)     extremely sick  . Stroke     cva  14  . GERD (gastroesophageal reflux disease)     occ  . Seizures     tx 5 yrs in middle school  continuous  . Arthritis   . Brain aneurysm   . DVT (deep venous thrombosis)    Past Surgical History  Procedure Laterality Date  . Lasar laparoscopy      for endometriosis x3  . Craniotomy Right 07/06/2014    Procedure: CRANIOTOMY INTRACRANIAL ANEURYSM FOR CAROTID;  Surgeon: Ashok Pall, MD;  Location: [facility];  Service: Neurosurgery;  Laterality: Right;  right    Family History  Problem Relation Age of Onset  . Hypertension Brother     2 out of 3 brothers  . Heart attack Brother     2 out of 3 brothers  . Diabetes Brother   . Asthma Sister   . Hypertension Mother   . Heart attack Father   . Breast cancer Sister    History  Substance Use Topics  . Smoking status: Former Smoker -- 0.00 packs/day for 30 years    Start date: 12/28/1983    Quit date:  05/26/2014  . Smokeless tobacco: Never Used     Comment: quit smoking 3 months ago  . Alcohol Use: No   OB History    No data available     Review of Systems  Gastrointestinal: Positive for nausea.  Neurological: Positive for headaches.  All other systems reviewed and are negative.     Allergies  Imitrex; Dilaudid; Ace inhibitors; Codeine; and Latex  Home Medications   Prior to Admission medications   Medication Sig Start Date End Date Taking? Authorizing Provider  ALPRAZolam [name]) 0.5 MG tablet Take 1 tablet (0.5 mg total) by mouth 2 (two) times daily as needed for anxiety. 08/06/14   Midge Minium, MD  buPROPion (WELLBUTRIN XL) 300 MG 24 hr tablet Take 300 mg by mouth daily.    Historical Provider, MD  HYDROcodone-acetaminophen (NORCO/VICODIN) 5-325 MG per tablet Take 1 tablet by mouth as needed for moderate pain (every 4-6 hrs as needed). 06/06/14   Midge Minium, MD  metoprolol tartrate (LOPRESSOR) 25 MG tablet Take 25 mg by mouth at bedtime.    Historical Provider, MD  Ondansetron HCl (ZOFRAN PO) Take by mouth.    Historical Provider, MD  pantoprazole (PROTONIX) 40 MG tablet Take 1 tablet (40  mg total) by mouth daily. 07/30/14   Midge Minium, MD  Rivaroxaban (XARELTO) 15 MG TABS tablet Take 15 mg by mouth 2 (two) times daily with a meal. Starts 20mg  once daily dose on Day 22.    Historical Provider, MD  sertraline (ZOLOFT) 50 MG tablet Take 1/2 tablet by mouth daily for 2 weeks, then increase to 1 tablet daily. 08/21/14   Midge Minium, MD  traZODone (DESYREL) 50 MG tablet Take 25-50 mg by mouth at bedtime as needed for sleep.    Historical Provider, MD   BP 100/72 mmHg  Pulse 78  Temp(Src) 98.1 F (36.7 C) (Oral)  Resp 18  Ht 5\' 7"  (1.702 m)  Wt 186 lb (84.369 kg)  BMI 29.12 kg/m2  SpO2 98%  LMP 12/28/2012 Physical Exam  Constitutional: She is oriented to person, place, and time. She appears well-developed.  Uncomfortable   HENT:  Head:  Normocephalic.  Surgical site healing well. Bilateral TM nl.   Eyes: Conjunctivae are normal. Pupils are equal, round, and reactive to light.  Neck: Normal range of motion. Neck supple.  Cardiovascular: Normal rate, regular rhythm and normal heart sounds.   Pulmonary/Chest: Effort normal and breath sounds normal. No respiratory distress. She has no wheezes. She has no rales.  Abdominal: Soft. Bowel sounds are normal. She exhibits no distension. There is no tenderness. There is no rebound and no guarding.  Musculoskeletal: Normal range of motion. She exhibits no edema or tenderness.  Neurological: She is alert and oriented to person, place, and time. No cranial nerve deficit. Coordination normal.  Cn 2-12 intact, nl strength throughout. Nl finger to nose   Skin: Skin is warm and dry.  Psychiatric: She has a normal mood and affect. Her behavior is normal. Judgment and thought content normal.  Nursing note and vitals reviewed.   ED Course  Procedures (including critical care time) Labs Review Labs Reviewed  COMPREHENSIVE METABOLIC PANEL - Abnormal; Notable for the following:    Glucose, Bld 120 (*)    GFR calc non Af Amer 74 (*)    GFR calc Af Amer 86 (*)    All other components within normal limits  CBC WITH DIFFERENTIAL    Imaging Review Ct Head Wo Contrast  09/12/2014   CLINICAL DATA:  Forehead pressure, dizziness. Pain in right eye, nausea.  EXAM: CT HEAD WITHOUT CONTRAST  TECHNIQUE: Contiguous axial images were obtained from the base of the skull through the vertex without intravenous contrast.  COMPARISON:  08/06/2014  FINDINGS: Changes of right craniotomy and aneurysm clipping. No hemorrhage or hydrocephalus. Previously seen rind of extra-axial blood has resolved. No acute infarction. No midline shift.  No acute calvarial abnormality.  Paranasal sinuses are clear.  IMPRESSION: Postoperative changes on the right as above. Resolution of the previously seen extra-axial blood. No acute  intracranial abnormality.   Electronically Signed   By: Rolm Baptise M.D.   On: 09/12/2014 15:26     EKG Interpretation None      MDM   Final diagnoses:  Headache    Melissa Morgan is a 48 y.o. female here with persistent headaches s/p aneurysm clip. Will repeat CT, consult neurosurgery, give migraine cocktail.  4:35 PM CT showed resolution of extra-axial blood. I called Dr. Christella Noa, who reviewed images and doesn't want CT angio or further workup. Patient's headache improved with reglan. Will d/c home with reglan prn, neurosugery f/u     Wandra Arthurs, MD 09/12/14 [phone]

## 2014-09-12 NOTE — Discharge Instructions (Signed)
Take reglan as needed for headache or nausea.   Take your other meds as prescribed.   Follow up with Dr. Christella Noa in a week.   Return to ER if you have severe headache, vomiting, weakness.

## 2014-09-12 NOTE — ED Notes (Signed)
C/o HA "pressure" intermittent x 3 week after brain aneurysm surgery 8/28-worse x 2 days

## 2014-09-12 NOTE — ED Notes (Signed)
During triage, pt received call from neurosurgeon and is on the phone at this time

## 2014-09-18 ENCOUNTER — Ambulatory Visit (INDEPENDENT_AMBULATORY_CARE_PROVIDER_SITE_OTHER): Payer: BC Managed Care – PPO | Admitting: *Deleted

## 2014-09-18 DIAGNOSIS — Z111 Encounter for screening for respiratory tuberculosis: Secondary | ICD-10-CM

## 2014-09-18 DIAGNOSIS — Z23 Encounter for immunization: Secondary | ICD-10-CM

## 2014-09-20 ENCOUNTER — Encounter: Payer: Self-pay | Admitting: *Deleted

## 2014-09-20 LAB — TB SKIN TEST
Induration: 0 mm
TB Skin Test: NEGATIVE

## 2014-09-26 NOTE — Telephone Encounter (Signed)
Spoke with mom, states daughter has appt with [facility] on 11/24 to address stomach issues and then she will transfer to [facility]

## 2014-09-28 ENCOUNTER — Ambulatory Visit (INDEPENDENT_AMBULATORY_CARE_PROVIDER_SITE_OTHER): Payer: BC Managed Care – PPO

## 2014-09-28 DIAGNOSIS — Z111 Encounter for screening for respiratory tuberculosis: Secondary | ICD-10-CM

## 2014-09-28 NOTE — Progress Notes (Signed)
Pt tolerated injection well

## 2014-09-28 NOTE — Progress Notes (Signed)
Pre visit review using our clinic review tool, if applicable. No additional management support is needed unless otherwise documented below in the visit note. 

## 2014-10-01 ENCOUNTER — Ambulatory Visit (INDEPENDENT_AMBULATORY_CARE_PROVIDER_SITE_OTHER): Payer: BC Managed Care – PPO | Admitting: Family Medicine

## 2014-10-01 ENCOUNTER — Other Ambulatory Visit: Payer: BC Managed Care – PPO | Admitting: Lab

## 2014-10-01 ENCOUNTER — Telehealth: Payer: Self-pay | Admitting: Hematology & Oncology

## 2014-10-01 ENCOUNTER — Encounter: Payer: Self-pay | Admitting: Family Medicine

## 2014-10-01 ENCOUNTER — Encounter: Payer: Self-pay | Admitting: Hematology & Oncology

## 2014-10-01 ENCOUNTER — Ambulatory Visit (HOSPITAL_BASED_OUTPATIENT_CLINIC_OR_DEPARTMENT_OTHER): Payer: BC Managed Care – PPO | Admitting: Hematology & Oncology

## 2014-10-01 ENCOUNTER — Ambulatory Visit (HOSPITAL_BASED_OUTPATIENT_CLINIC_OR_DEPARTMENT_OTHER)
Admission: RE | Admit: 2014-10-01 | Discharge: 2014-10-01 | Disposition: A | Discharge status: Home / Self Care | Payer: BC Managed Care – PPO | Source: Ambulatory Visit | Attending: Hematology & Oncology | Admitting: Hematology & Oncology

## 2014-10-01 ENCOUNTER — Encounter: Payer: Self-pay | Admitting: General Practice

## 2014-10-01 VITALS — BP 130/80 | HR 79 | Temp 98.4°F | Resp 16 | Wt 187.1 lb

## 2014-10-01 VITALS — BP 116/80 | HR 69 | Temp 97.9°F | Resp 14 | Ht 66.0 in | Wt 185.0 lb

## 2014-10-01 DIAGNOSIS — Z7901 Long term (current) use of anticoagulants: Secondary | ICD-10-CM | POA: Insufficient documentation

## 2014-10-01 DIAGNOSIS — Z86718 Personal history of other venous thrombosis and embolism: Secondary | ICD-10-CM | POA: Diagnosis not present

## 2014-10-01 DIAGNOSIS — I82402 Acute embolism and thrombosis of unspecified deep veins of left lower extremity: Secondary | ICD-10-CM | POA: Insufficient documentation

## 2014-10-01 DIAGNOSIS — K921 Melena: Secondary | ICD-10-CM | POA: Diagnosis not present

## 2014-10-01 DIAGNOSIS — R110 Nausea: Secondary | ICD-10-CM | POA: Insufficient documentation

## 2014-10-01 DIAGNOSIS — I82491 Acute embolism and thrombosis of other specified deep vein of right lower extremity: Secondary | ICD-10-CM

## 2014-10-01 DIAGNOSIS — F418 Other specified anxiety disorders: Secondary | ICD-10-CM

## 2014-10-01 DIAGNOSIS — R300 Dysuria: Secondary | ICD-10-CM

## 2014-10-01 DIAGNOSIS — R109 Unspecified abdominal pain: Secondary | ICD-10-CM

## 2014-10-01 DIAGNOSIS — Z8673 Personal history of transient ischemic attack (TIA), and cerebral infarction without residual deficits: Secondary | ICD-10-CM | POA: Insufficient documentation

## 2014-10-01 DIAGNOSIS — R0789 Other chest pain: Secondary | ICD-10-CM

## 2014-10-01 DIAGNOSIS — R079 Chest pain, unspecified: Secondary | ICD-10-CM | POA: Insufficient documentation

## 2014-10-01 DIAGNOSIS — I82401 Acute embolism and thrombosis of unspecified deep veins of right lower extremity: Secondary | ICD-10-CM

## 2014-10-01 LAB — CBC WITH DIFFERENTIAL/PLATELET
BASOS PCT: 0.7 % (ref 0.0–3.0)
Basophils Absolute: 0 10*3/uL (ref 0.0–0.1)
EOS ABS: 0.1 10*3/uL (ref 0.0–0.7)
Eosinophils Relative: 1.8 % (ref 0.0–5.0)
HCT: 41.4 % (ref 36.0–46.0)
HEMOGLOBIN: 13.4 g/dL (ref 12.0–15.0)
LYMPHS PCT: 38.7 % (ref 12.0–46.0)
Lymphs Abs: 2.2 10*3/uL (ref 0.7–4.0)
MCHC: 32.2 g/dL (ref 30.0–36.0)
MCV: 90.4 fl (ref 78.0–100.0)
Monocytes Absolute: 0.4 10*3/uL (ref 0.1–1.0)
Monocytes Relative: 6.7 % (ref 3.0–12.0)
NEUTROS ABS: 3 10*3/uL (ref 1.4–7.7)
Neutrophils Relative %: 52.1 % (ref 43.0–77.0)
Platelets: 226 10*3/uL (ref 150.0–400.0)
RBC: 4.58 Mil/uL (ref 3.87–5.11)
RDW: 14 % (ref 11.5–15.5)
WBC: 5.8 10*3/uL (ref 4.0–10.5)

## 2014-10-01 LAB — HEPATIC FUNCTION PANEL
ALT: 16 U/L (ref 0–35)
AST: 18 U/L (ref 0–37)
Albumin: 4.5 g/dL (ref 3.5–5.2)
Alkaline Phosphatase: 67 U/L (ref 39–117)
Bilirubin, Direct: 0 mg/dL (ref 0.0–0.3)
TOTAL PROTEIN: 7.5 g/dL (ref 6.0–8.3)
Total Bilirubin: 0.6 mg/dL (ref 0.2–1.2)

## 2014-10-01 LAB — URINALYSIS, MICROSCOPIC (CHCC SATELLITE)
Bilirubin (Urine): NEGATIVE
Blood: NEGATIVE
Glucose: NEGATIVE mg/dL
Ketones: NEGATIVE mg/dL
Leukocyte Esterase: NEGATIVE
Nitrite: NEGATIVE
Protein: NEGATIVE mg/dL
Specific Gravity, Urine: 1.03 (ref 1.003–1.035)
Urobilinogen, UR: 0.2 mg/dL (ref 0.2–1)
pH: 5 (ref 4.60–8.00)

## 2014-10-01 LAB — TB SKIN TEST
Induration: 0 mm
TB SKIN TEST: NEGATIVE

## 2014-10-01 LAB — BASIC METABOLIC PANEL
BUN: 12 mg/dL (ref 6–23)
CO2: 28 meq/L (ref 19–32)
Calcium: 9.7 mg/dL (ref 8.4–10.5)
Chloride: 101 mEq/L (ref 96–112)
Creatinine, Ser: 0.9 mg/dL (ref 0.4–1.2)
GFR: 70.84 mL/min (ref >60.00)
GLUCOSE: 100 mg/dL — AB (ref 70–99)
POTASSIUM: 4.3 meq/L (ref 3.5–5.1)
Sodium: 138 mEq/L (ref 135–145)

## 2014-10-01 LAB — TROPONIN I

## 2014-10-01 LAB — D-DIMER, QUANTITATIVE: D-Dimer, Quant: 0.27 ug/mL-FEU (ref 0.00–0.48)

## 2014-10-01 MED ORDER — TRAZODONE HCL 50 MG PO TABS: 25.0000 mg | ORAL_TABLET | Freq: Every evening | ORAL | Status: DC | PRN

## 2014-10-01 MED ORDER — SERTRALINE HCL 100 MG PO TABS: 100.0000 mg | ORAL_TABLET | Freq: Every day | ORAL | Status: DC

## 2014-10-01 MED ORDER — ALPRAZOLAM 0.5 MG PO TABS: 0.5000 mg | ORAL_TABLET | Freq: Two times a day (BID) | ORAL | Status: DC | PRN

## 2014-10-01 NOTE — Addendum Note (Signed)
Addended by: Modena Morrow D on: 10/01/2014 11:09 AM   Modules accepted: Orders

## 2014-10-01 NOTE — Assessment & Plan Note (Signed)
New.  Pt's pain is atypical for cardiac but she does have a hx of DVT.  EKG WNL.  Check troponin and D Dimer.  Suspect this is due to pt's severe anxiety or her under-treated GERD.  Encouraged daily protonix, counseling for her stress.  If sxs return will refer to cards.  Pt expressed understanding and is in agreement w/ plan.

## 2014-10-01 NOTE — Assessment & Plan Note (Signed)
New.  Suspect this is due to fact that pt isn't taking Protonix daily.  Check labs to r/o biliary abnormality, infection.  Encouraged her to start daily PPI.  Will follow.

## 2014-10-01 NOTE — Patient Instructions (Signed)
Follow up in 6 weeks to recheck mood We'll notify you of your lab results and make any changes if needed Increase Sertraline to 100mg  daily- 2 of what you have at home and 1 of the new prescription Continue the Buproprion EKG is normal- this is great news! Make sure you are taking the Protonix daily to help prevent nausea Call with any questions or concerns Happy Holidays!!!

## 2014-10-01 NOTE — Telephone Encounter (Signed)
Mailed feb. schedule °

## 2014-10-01 NOTE — Progress Notes (Signed)
Hematology and Oncology Follow Up Visit  Grady Lucci [id number] 30-Jul-1966 48 y.o. 10/01/2014   Principle Diagnosis:  Thrombus in the right peroneal vein   Current Therapy:    Xarelto 20 mg by mouth daily-6 months of therapy needed     Interim History:  Ms.  Cancro is back for follow-up. We first saw her back in September. Since then, she's been doing okay. She has an incredible amount of anxiety. She's worried about being on blood thinner. She is worried about bleeding into her brain. I try to reassure her that this was of very low risk.  She had a hypercoagulable panel done on first saw her. Everything was negative. There is no obvious thrombophilic condition that she had.  We did do a Doppler of her right leg. There is no evidence of residual thrombus.  She does not have any pain in the legs. She's having some abdominal discomfort. We'll first saw her, I thought we can do a CT of the abdomen. She did not want to do this. Given that the Doppler is negative, I think we can hold off on any abdominal films. She's complaining of abdominal discomfort. We will check a urinalysis.  There is no cough. There is no shortness of breath. There is no chest wall pain.  She's followed closely by neurosurgery.   Medications: Current outpatient prescriptions: ALPRAZolam (XANAX) 0.5 MG tablet, Take 1 tablet (0.5 mg total) by mouth 2 (two) times daily as needed for anxiety., Disp: 60 tablet, Rfl: 1;  buPROPion (WELLBUTRIN XL) 300 MG 24 hr tablet, Take 300 mg by mouth daily., Disp: , Rfl: ;  HYDROcodone-acetaminophen (NORCO/VICODIN) 5-325 MG per tablet, Take 1 tablet by mouth as needed for moderate pain (every 4-6 hrs as needed)., Disp: 30 tablet, Rfl: 0 metoprolol tartrate (LOPRESSOR) 25 MG tablet, Take 25 mg by mouth at bedtime., Disp: , Rfl: ;  Ondansetron HCl (ZOFRAN PO), Take by mouth., Disp: , Rfl: ;  pantoprazole (PROTONIX) 40 MG tablet, Take 1 tablet (40 mg total) by mouth daily., Disp: 30 tablet,  Rfl: 3;  Rivaroxaban (XARELTO) 15 MG TABS tablet, Take 15 mg by mouth 2 (two) times daily with a meal. Starts 20mg  once daily dose on Day 22., Disp: , Rfl:  sertraline (ZOLOFT) 100 MG tablet, Take 1 tablet (100 mg total) by mouth daily., Disp: 30 tablet, Rfl: 3;  traZODone (DESYREL) 50 MG tablet, Take 0.5-1 tablets (25-50 mg total) by mouth at bedtime as needed for sleep., Disp: 30 tablet, Rfl: 3  Allergies:  Allergies  Allergen Reactions  . Imitrex [Sumatriptan] Other (See Comments)    Increase heart rate and chest pain   . Dilaudid [Hydromorphone Hcl] Nausea And Vomiting  . Ace Inhibitors Other (See Comments)    REACTION: unknown  . Codeine Nausea Only  . Latex Itching    Past Medical History, Surgical history, Social history, and Family History were reviewed and updated.  Review of Systems: As above  Physical Exam:  height is 5\' 6"  (1.676 m) and weight is 185 lb (83.915 kg). Her oral temperature is 97.9 F (36.6 C). Her blood pressure is 116/80 and her pulse is 69. Her respiration is 14.   Well-developed and well-nourished white female. Head and neck exam shows no ocular or oral lesions. She has no palpable cervical or supraclavicular lymph nodes. Lungs are clear. Cardiac exam regular rate and rhythm with no murmurs, rubs or bruits. Abdomen is soft. She has good bowel sounds. There is no fluid wave.  There is no palpable liver or spleen tip. Back exam shows no tenderness over the spine, ribs or hips. Extremities shows no clubbing, cyanosis or edema. No swelling is noted in the legs. There is no venous cord in the right leg. She has good range of motion of her joints. Skin exam no rashes, ecchymoses or petechia. Neurological exam is nonfocal.  Lab Results  Component Value Date   WBC 5.8 10/01/2014   HGB 13.4 10/01/2014   HCT 41.4 10/01/2014   MCV 90.4 10/01/2014   PLT 226.0 10/01/2014     Chemistry      Component Value Date/Time   NA 138 10/01/2014 1032   K 4.3 10/01/2014 1032     CL 101 10/01/2014 1032   CO2 28 10/01/2014 1032   BUN 12 10/01/2014 1032   CREATININE 0.9 10/01/2014 1032      Component Value Date/Time   CALCIUM 9.7 10/01/2014 1032   ALKPHOS 67 10/01/2014 1032   AST 18 10/01/2014 1032   ALT 16 10/01/2014 1032   BILITOT 0.6 10/01/2014 1032         Impression and Plan: Ms. Nelson is 48 year old white female with a post surgical thrombus in the right leg. This is resolved. We will keep her on Xarelto for total of 6 months. This will finish out the end of February.  After therapeutic course medical condition, we may just get her on aspirin. I also am thinking about low-dose Xarelto.   I spent about half hour with her. I try to reassure as much likelihood that it was safe for her to be on Xarelto. Of course, she watches all these commercials about the dangers of Xarelto in all the bleeding that it causes.  Volanda Napoleon, MD 11/23/20156:03 PM

## 2014-10-01 NOTE — Assessment & Plan Note (Signed)
Chronic problem.  Not improving.  Pt continues to have panic attacks.  Has not sought counseling as recommended.  Increase Zoloft to 100mg  daily.  Continue Wellbutrin.  Stressed need for counseling.  Will continue to follow.
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
## 2014-10-01 NOTE — Progress Notes (Signed)
Pre visit review using our clinic review tool, if applicable. No additional management support is needed unless otherwise documented below in the visit note. 

## 2014-10-02 ENCOUNTER — Telehealth: Payer: Self-pay | Admitting: *Deleted

## 2014-10-02 LAB — URINE CULTURE

## 2014-10-02 NOTE — Telephone Encounter (Addendum)
Notified patient on 11/23  ----- Message from Volanda Napoleon, MD sent at 10/01/2014  6:18 PM EST ----- Please call and tell her that the urinalysis is normal. There is no infection that we can see.Marland Kitchen Laurey Arrow

## 2014-10-03 ENCOUNTER — Telehealth: Payer: Self-pay | Admitting: *Deleted

## 2014-10-03 NOTE — Telephone Encounter (Signed)
-----   Message from Volanda Napoleon, MD sent at 10/03/2014  7:33 AM EST ----- Please call and let her know that the urine culture is negative. Laurey Arrow

## 2014-10-11 ENCOUNTER — Other Ambulatory Visit: Payer: Self-pay | Admitting: Medical

## 2014-10-15 ENCOUNTER — Other Ambulatory Visit: Payer: Self-pay

## 2014-10-15 MED ORDER — RIVAROXABAN 20 MG PO TABS: 20.0000 mg | ORAL_TABLET | Freq: Every day | ORAL | Status: DC

## 2014-11-20 ENCOUNTER — Other Ambulatory Visit: Payer: Self-pay | Admitting: Family Medicine

## 2014-11-21 NOTE — Telephone Encounter (Signed)
Med filled.  

## 2014-11-26 ENCOUNTER — Telehealth: Payer: Self-pay | Admitting: Family Medicine

## 2014-11-26 ENCOUNTER — Ambulatory Visit (INDEPENDENT_AMBULATORY_CARE_PROVIDER_SITE_OTHER): Payer: BLUE CROSS/BLUE SHIELD | Admitting: Internal Medicine

## 2014-11-26 ENCOUNTER — Encounter: Payer: Self-pay | Admitting: Family Medicine

## 2014-11-26 ENCOUNTER — Encounter: Payer: Self-pay | Admitting: Internal Medicine

## 2014-11-26 VITALS — BP 98/80 | HR 82 | Temp 98.6°F | Ht 66.0 in | Wt 187.8 lb

## 2014-11-26 DIAGNOSIS — J069 Acute upper respiratory infection, unspecified: Secondary | ICD-10-CM

## 2014-11-26 NOTE — Telephone Encounter (Signed)
Pt notified per mychart.

## 2014-11-26 NOTE — Telephone Encounter (Signed)
Caller name: Aliha Relation to pt: self Call back number: [phone] Pharmacy: [facility] on [address]  Reason for call:   Patient states that she has a cold and would like to know what OTC medications she could take that wouldn't interfere with the medications that she is on. Her symptoms are runny/stopped up nose, sore throat, cough, headaches, sinus drainage.

## 2014-11-26 NOTE — Telephone Encounter (Signed)
She can try OTC Mucinex DM but based on her symptoms, I would recommend evaluation for possible sinus infection

## 2014-11-26 NOTE — Patient Instructions (Signed)
Plain Mucinex (NOT D) for thick secretions ;force NON dairy fluids .   Nasal cleansing in the shower as discussed with lather of mild shampoo.After 10 seconds wash off lather while  exhaling through nostrils. Make sure that all residual soap is removed to prevent irritation.  Flonase OR Nasacort AQ 1 spray in each nostril twice a day as needed. Use the "crossover" technique into opposite nostril spraying toward opposite ear @ 45 degree angle, not straight up into nostril.  Plain Allegra (NOT D )  160 daily , Loratidine 10 mg , OR Zyrtec 10 mg @ bedtime  as needed for itchy eyes & sneezing.  Zicam Melts or Zinc lozenges as per package label for sore throat . Complementary options include  vitamin C 2000 mg daily; & Echinacea for 4-7 days.  Call for antibiotic if fever; discolored nasal or chest secretions; or frontal headache or facial pain present

## 2014-11-26 NOTE — Progress Notes (Signed)
Pre visit review using our clinic review tool, if applicable. No additional management support is needed unless otherwise documented below in the visit note. 

## 2014-11-26 NOTE — Progress Notes (Signed)
   Subjective:    Patient ID: Melissa Morgan, female    DOB: 1966/10/16, 49 y.o.   MRN: [id number]  HPI  Symptoms began during the night last night. She describes postnasal drainage, sore throat, malaise, fatigue, and otic drainage. She also has some substernal burning as if exposed to dust. Cough is been mainly nonproductive or only  productive of scant clear material.  She does describe some shortness of breath and sweats.  She's actually had chronic rhinitis and postnasal drainage since a 10 mm aneurysm was resected 07/06/14.    Review of Systems She is not having extrinsic symptoms of itchy, watery eyes.  She has no wheezing.  She has no purulent nasal discharge    Objective:   Physical Exam  Her exam is unremarkable except for nasal mucosa erythema, particularly of the right septum.  General appearance:good health ;well nourished; no acute distress or increased work of breathing is present.  No  lymphadenopathy about the head, neck, or axilla noted.   Eyes: No conjunctival inflammation or lid edema is present. There is no scleral icterus.  Ears:  External ear exam shows no significant lesions or deformities.  Otoscopic examination reveals clear canals, tympanic membranes are intact bilaterally without bulging, retraction, inflammation or discharge.  Nose:  External nasal examination shows no deformity or inflammation.No obstruction to airflow.   Oral exam: Dental hygiene is good; lips and gums are healthy appearing.There is no oropharyngeal erythema or exudate noted.   Neck:  No deformities, thyromegaly, masses, or tenderness noted.   Supple with full range of motion without pain.   Heart:  Normal rate and regular rhythm. S1 and S2 normal without gallop, murmur, click, rub or other extra sounds.   Lungs:Chest clear to auscultation; no wheezes, rhonchi,rales ,or rubs present.No increased work of breathing.    Extremities:  No cyanosis, edema, or clubbing  noted    Skin:  Warm & dry w/o jaundice or tenting.       Assessment & Plan:  #1 viral upper respiratory tract infection  Plan: See orders  & recommendations

## 2014-12-03 ENCOUNTER — Telehealth: Payer: Self-pay | Admitting: *Deleted

## 2014-12-03 NOTE — Telephone Encounter (Signed)
Caller name: [name] Relation to pt: self Call back number: [phone] Pharmacy:  Reason for call: Pt called requesting refill on   ALPRAZolam [name]) 0.5 MG tablet  Last filled 10/01/2014, #60, 1 refill Last OV with Tabori 10/01/2014  Pt states she is having an anxiety attack right now and wanted to know if someone could get this for her tonight. I advised her that with controlled substances, there is a 72 hour turn around, even though it normally does not take 72 hours. Pt stated she had never had an issue getting it before and was not aware Tabori and her nurse did not work late. She seemed upset with me when I told her Birdie Riddle and Janett Billow were not here and Birdie Riddle would be the one to prescribe her the Xanax and it would at least be tomorrow before it would be ready. I let her know I was putting in a telephone note for her request and she told me I could do that without her on the phone and pt hung up.

## 2014-12-04 ENCOUNTER — Encounter: Payer: Self-pay | Admitting: Family Medicine

## 2014-12-04 MED ORDER — ALPRAZOLAM 0.5 MG PO TABS: 0.5000 mg | ORAL_TABLET | Freq: Two times a day (BID) | ORAL | Status: DC | PRN

## 2014-12-04 NOTE — Telephone Encounter (Signed)
Last OV 10-01-14 Alprazolam last filled 10-01-14 #60 with 1

## 2014-12-04 NOTE — Telephone Encounter (Signed)
Med filled and faxed.  

## 2014-12-04 NOTE — Telephone Encounter (Signed)
Ok for #60, 1 refill 

## 2014-12-19 ENCOUNTER — Encounter: Payer: Self-pay | Admitting: Family Medicine

## 2014-12-19 ENCOUNTER — Ambulatory Visit (INDEPENDENT_AMBULATORY_CARE_PROVIDER_SITE_OTHER): Payer: BLUE CROSS/BLUE SHIELD | Admitting: Family Medicine

## 2014-12-19 VITALS — BP 104/68 | HR 60 | Temp 98.0°F | Resp 16 | Wt 190.6 lb

## 2014-12-19 DIAGNOSIS — M2550 Pain in unspecified joint: Secondary | ICD-10-CM

## 2014-12-19 DIAGNOSIS — N644 Mastodynia: Secondary | ICD-10-CM

## 2014-12-19 MED ORDER — BUPROPION HCL ER (XL) 150 MG PO TB24: 150.0000 mg | ORAL_TABLET | Freq: Every day | ORAL | Status: DC

## 2014-12-19 MED ORDER — DULOXETINE HCL 30 MG PO CPEP: 30.0000 mg | ORAL_CAPSULE | Freq: Every day | ORAL | Status: DC

## 2014-12-19 NOTE — Progress Notes (Signed)
   Subjective:    Patient ID: Melissa Morgan, female    DOB: 1966-05-17, 49 y.o.   MRN: [id number]  HPI R breast pain- pt reports there is a hard area in R breast inferiorly.  Pain is starting to ease but still TTP.  First noticed 1 week ago.  Overdue for mammo.  Denies redness or warmth.  Pain- 'every joint', 'daughter thinks I have fibromyalgia'.  Pt reports she will wake w/ pain in hips, knees, feet, back, shoulders.  Pt reports she has been having pain 'for a long time'.  Review of Systems For ROS see HPI     Objective:   Physical Exam  Constitutional: She is oriented to person, place, and time. She appears well-developed and well-nourished. No distress.  Cardiovascular: Normal rate, regular rhythm, normal heart sounds and intact distal pulses.   Pulmonary/Chest: Effort normal and breath sounds normal. No respiratory distress. She has no wheezes. She has no rales. Right breast exhibits tenderness. Right breast exhibits no inverted nipple, no mass, no nipple discharge and no skin change. Left breast exhibits no inverted nipple, no mass, no nipple discharge, no skin change and no tenderness.    Musculoskeletal: She exhibits tenderness (TTP over multiple trigger points). She exhibits no edema.  Neurological: She is alert and oriented to person, place, and time.  Skin: Skin is warm and dry. No erythema.  Psychiatric:  Very anxious Rapid, almost pressured speech Tangential thought process  Vitals reviewed.         Assessment & Plan:

## 2014-12-19 NOTE — Patient Instructions (Signed)
Follow up as needed We'll notify you of your lab results and make any changes if needed STOP the Zoloft START the Cymbalta daily DECREASE the Wellbutrin to 150mg  daily (new script sent)- goal is to get off this as we get the Cymbalta in your system We'll call you with your mammogram appt Call with any questions or concerns Happy Valentine's Day

## 2014-12-19 NOTE — Progress Notes (Signed)
Pre visit review using our clinic review tool, if applicable. No additional management support is needed unless otherwise documented below in the visit note. 

## 2014-12-20 ENCOUNTER — Encounter: Payer: Self-pay | Admitting: Family Medicine

## 2014-12-20 LAB — CBC WITH DIFFERENTIAL/PLATELET
BASOS ABS: 0 10*3/uL (ref 0.0–0.1)
Basophils Relative: 0.5 % (ref 0.0–3.0)
Eosinophils Absolute: 0.1 10*3/uL (ref 0.0–0.7)
Eosinophils Relative: 2.4 % (ref 0.0–5.0)
HCT: 39.2 % (ref 36.0–46.0)
Hemoglobin: 13.1 g/dL (ref 12.0–15.0)
LYMPHS PCT: 42.4 % (ref 12.0–46.0)
Lymphs Abs: 2.7 10*3/uL (ref 0.7–4.0)
MCHC: 33.5 g/dL (ref 30.0–36.0)
MCV: 87.2 fl (ref 78.0–100.0)
Monocytes Absolute: 0.6 10*3/uL (ref 0.1–1.0)
Monocytes Relative: 9.5 % (ref 3.0–12.0)
Neutro Abs: 2.9 10*3/uL (ref 1.4–7.7)
Neutrophils Relative %: 45.2 % (ref 43.0–77.0)
Platelets: 236 10*3/uL (ref 150.0–400.0)
RBC: 4.49 Mil/uL (ref 3.87–5.11)
RDW: 14.6 % (ref 11.5–15.5)
WBC: 6.3 10*3/uL (ref 4.0–10.5)

## 2014-12-20 LAB — ANA: Anti Nuclear Antibody(ANA): NEGATIVE

## 2014-12-20 LAB — RHEUMATOID FACTOR: Rhuematoid fact SerPl-aCnc: <10 IU/mL (ref <=14)

## 2014-12-20 LAB — SEDIMENTATION RATE: SED RATE: 24 mm/h — AB (ref 0–22)

## 2014-12-23 NOTE — Assessment & Plan Note (Signed)
New.  Pt is due for mammo- will order diagnostic imaging for better evaluation.  Discussed refraining from caffeine as this can cause breast pain.  Will follow.

## 2014-12-23 NOTE — Assessment & Plan Note (Addendum)
Given pt's pain over multiple trigger points, depression/anxiety, and poor sleep- suspect that she does have fibromyalgia.  Check labs to r/o autoimmune process- although this is likely.  Start low dose Cymbalta in place of Zoloft and wean Wellbutrin.  Reviewed supportive care and red flags that should prompt return.  Pt expressed understanding and is in agreement w/ plan.

## 2014-12-24 ENCOUNTER — Encounter: Payer: Self-pay | Admitting: Nurse Practitioner

## 2014-12-26 ENCOUNTER — Other Ambulatory Visit: Payer: Self-pay | Admitting: Family Medicine

## 2014-12-26 DIAGNOSIS — N644 Mastodynia: Secondary | ICD-10-CM

## 2014-12-31 ENCOUNTER — Encounter: Payer: Self-pay | Admitting: Neurology

## 2014-12-31 ENCOUNTER — Telehealth: Payer: Self-pay | Admitting: Hematology & Oncology

## 2014-12-31 ENCOUNTER — Encounter: Payer: Self-pay | Admitting: Hematology & Oncology

## 2014-12-31 ENCOUNTER — Ambulatory Visit (HOSPITAL_BASED_OUTPATIENT_CLINIC_OR_DEPARTMENT_OTHER): Payer: BLUE CROSS/BLUE SHIELD | Admitting: Hematology & Oncology

## 2014-12-31 ENCOUNTER — Telehealth: Payer: Self-pay | Admitting: Family Medicine

## 2014-12-31 ENCOUNTER — Other Ambulatory Visit (HOSPITAL_BASED_OUTPATIENT_CLINIC_OR_DEPARTMENT_OTHER): Payer: BLUE CROSS/BLUE SHIELD | Admitting: Lab

## 2014-12-31 ENCOUNTER — Ambulatory Visit (INDEPENDENT_AMBULATORY_CARE_PROVIDER_SITE_OTHER): Payer: BLUE CROSS/BLUE SHIELD | Admitting: Neurology

## 2014-12-31 VITALS — BP 118/82 | HR 79 | Resp 16 | Ht 68.0 in | Wt 193.0 lb

## 2014-12-31 VITALS — BP 120/76 | HR 74 | Temp 98.2°F | Resp 14 | Ht 67.0 in | Wt 194.0 lb

## 2014-12-31 DIAGNOSIS — I82401 Acute embolism and thrombosis of unspecified deep veins of right lower extremity: Secondary | ICD-10-CM

## 2014-12-31 DIAGNOSIS — R51 Headache: Secondary | ICD-10-CM

## 2014-12-31 DIAGNOSIS — R300 Dysuria: Secondary | ICD-10-CM

## 2014-12-31 DIAGNOSIS — I671 Cerebral aneurysm, nonruptured: Secondary | ICD-10-CM

## 2014-12-31 DIAGNOSIS — I82402 Acute embolism and thrombosis of unspecified deep veins of left lower extremity: Secondary | ICD-10-CM

## 2014-12-31 DIAGNOSIS — R519 Headache, unspecified: Secondary | ICD-10-CM

## 2014-12-31 LAB — CBC WITH DIFFERENTIAL (CANCER CENTER ONLY)
BASO#: 0 10*3/uL (ref 0.0–0.2)
BASO%: 0.5 % (ref 0.0–2.0)
EOS ABS: 0.1 10*3/uL (ref 0.0–0.5)
EOS%: 1.9 % (ref 0.0–7.0)
HEMATOCRIT: 38.1 % (ref 34.8–46.6)
HEMOGLOBIN: 12.5 g/dL (ref 11.6–15.9)
LYMPH#: 2.6 10*3/uL (ref 0.9–3.3)
LYMPH%: 39.8 % (ref 14.0–48.0)
MCH: 29.5 pg (ref 26.0–34.0)
MCHC: 32.8 g/dL (ref 32.0–36.0)
MCV: 90 fL (ref 81–101)
MONO#: 0.5 10*3/uL (ref 0.1–0.9)
MONO%: 7.3 % (ref 0.0–13.0)
NEUT#: 3.3 10*3/uL (ref 1.5–6.5)
NEUT%: 50.5 % (ref 39.6–80.0)
PLATELETS: 210 10*3/uL (ref 145–400)
RBC: 4.24 10*6/uL (ref 3.70–5.32)
RDW: 13.9 % (ref 11.1–15.7)
WBC: 6.5 10*3/uL (ref 3.9–10.0)

## 2014-12-31 LAB — CMP (CANCER CENTER ONLY)
ALBUMIN: 3.6 g/dL (ref 3.3–5.5)
ALT: 22 U/L (ref 10–47)
AST: 24 U/L (ref 11–38)
Alkaline Phosphatase: 59 U/L (ref 26–84)
BUN: 11 mg/dL (ref 7–22)
CHLORIDE: 100 meq/L (ref 98–108)
CO2: 30 mEq/L (ref 18–33)
CREATININE: 1 mg/dL (ref 0.6–1.2)
Calcium: 9.3 mg/dL (ref 8.0–10.3)
Glucose, Bld: 144 mg/dL — ABNORMAL HIGH (ref 73–118)
Potassium: 3.8 mEq/L (ref 3.3–4.7)
Sodium: 144 mEq/L (ref 128–145)
Total Bilirubin: 0.7 mg/dl (ref 0.20–1.60)
Total Protein: 7 g/dL (ref 6.4–8.1)

## 2014-12-31 MED ORDER — RIVAROXABAN 10 MG PO TABS: 5.0000 mg | ORAL_TABLET | Freq: Every day | ORAL | Status: DC

## 2014-12-31 NOTE — Progress Notes (Signed)
Hematology and Oncology Follow Up Visit  Melissa Morgan [id number] 07-10-1966 49 y.o. 12/31/2014   Principle Diagnosis:  Thrombus in the right peroneal vein   Current Therapy:    Xarelto 20 mg by mouth daily-6 months of therapy needed     Interim History:  [name] is back for follow-up. Unfortunately, she is really been upset. Apparently, there was an issue with some of the people in our office. She had a billing problem. It escalated to the point that she needed security to help her. I find this all hard to believe.  I think that this easily could've been settled if I would've know what the problem was.  The issue was that her insurance company was not going to pay for her hypercoagulable panel. This was ridiculous. There is no reason for her insurance, to deny the test. It was a matter of me just writing a letter. I had no idea that she had all the issues with some of our staff. I listened to her for about 45 minutes about this. Her daughter came in with her. I settle her down. I will have to look into this to see exactly what the problem was.  She is on Xarelto. She's been on full dose Xarelto for about 6 months. I think that we can get her on a low-dose Xarelto. I think that 5 mg a day of Xarelto would not be a bad idea for her. After her last Doppler test did not show any evidence of thromboembolic disease.  She still has a lot of anxiety. She is getting her divorce finalized. This is also causing stress for her.  She is trying to find a job. 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 Hopefully, she will find a job. I think that she will.  She needs a mammogram. A probably, there is some issue with her right breast. Not sure exactly what this is but I reiterated to her that a mammogram was very important.  Her cerebral aneurysm seems to be doing quite well. A much sure when she goes back to see neurosurgery..   There is no cough. There is no shortness of breath. There is no chest  wall pain.     Medications:  Current outpatient prescriptions:  .  ALPRAZolam (XANAX) 0.5 MG tablet, Take 1 tablet (0.5 mg total) by mouth 2 (two) times daily as needed for anxiety., Disp: 60 tablet, Rfl: 1 .  DULoxetine (CYMBALTA) 30 MG capsule, Take 1 capsule (30 mg total) by mouth daily., Disp: 30 capsule, Rfl: 3 .  metoprolol tartrate (LOPRESSOR) 25 MG tablet, Take 25 mg by mouth at bedtime., Disp: , Rfl:  .  pantoprazole (PROTONIX) 40 MG tablet, Take 1 tablet (40 mg total) by mouth daily., Disp: 30 tablet, Rfl: 3 .  rivaroxaban (XARELTO) 10 MG TABS tablet, Take 0.5 tablets (5 mg total) by mouth daily with supper., Disp: 30 tablet, Rfl: 6 .  traZODone (DESYREL) 50 MG tablet, Take 0.5-1 tablets (25-50 mg total) by mouth at bedtime as needed for sleep., Disp: 30 tablet, Rfl: 3 .  Ondansetron HCl (ZOFRAN PO), Take 4 mg by mouth as needed. , Disp: , Rfl:   Allergies:  Allergies  Allergen Reactions  . Imitrex [Sumatriptan] Other (See Comments)    Increase heart rate and chest pain   . Dilaudid [Hydromorphone Hcl] Nausea And Vomiting  . Ace Inhibitors Other (See Comments)    REACTION: unknown  . Codeine Nausea Only  . Latex Itching  Past Medical History, Surgical history, Social history, and Family History were reviewed and updated.  Review of Systems: As above  Physical Exam:  height is 5\' 7"  (1.702 m) and weight is 194 lb (87.998 kg). Her oral temperature is 98.2 F (36.8 C). Her blood pressure is 120/76 and her pulse is 74. Her respiration is 14.   Well-developed and well-nourished white female. Head and neck exam shows no ocular or oral lesions. She has no palpable cervical or supraclavicular lymph nodes. Lungs are clear. Cardiac exam regular rate and rhythm with no murmurs, rubs or bruits. Abdomen is soft. She has good bowel sounds. There is no fluid wave. There is no palpable liver or spleen tip. Back exam shows no tenderness over the spine, ribs or hips. Extremities shows  no clubbing, cyanosis or edema. No swelling is noted in the legs. There is no venous cord in the right leg. She has good range of motion of her joints. Skin exam no rashes, ecchymoses or petechia. Neurological exam is nonfocal.  Lab Results  Component Value Date   WBC 6.5 12/31/2014   HGB 12.5 12/31/2014   HCT 38.1 12/31/2014   MCV 90 12/31/2014   PLT 210 12/31/2014     Chemistry      Component Value Date/Time   NA 144 12/31/2014 1329   NA 138 10/01/2014 1032   K 3.8 12/31/2014 1329   K 4.3 10/01/2014 1032   CL 100 12/31/2014 1329   CL 101 10/01/2014 1032   CO2 30 12/31/2014 1329   CO2 28 10/01/2014 1032   BUN 11 12/31/2014 1329   BUN 12 10/01/2014 1032   CREATININE 1.0 12/31/2014 1329   CREATININE 0.9 10/01/2014 1032      Component Value Date/Time   CALCIUM 9.3 12/31/2014 1329   CALCIUM 9.7 10/01/2014 1032   ALKPHOS 59 12/31/2014 1329   ALKPHOS 67 10/01/2014 1032   AST 24 12/31/2014 1329   AST 18 10/01/2014 1032   ALT 22 12/31/2014 1329   ALT 16 10/01/2014 1032   BILITOT 0.70 12/31/2014 1329   BILITOT 0.6 10/01/2014 1032         Impression and Plan: Melissa Morgan is 49 year old white female with a post surgical thrombus in the right leg. This is resolved. She will not go on low-dose Xarelto. I did this will be helpful. I think it will be safe.  I don't see that we have to do any additional testing on her.  I will plan to get her back in another 3 months.  Again, I spent about 45 minutes with her. I just tried to sort out what exactly had happened and what kind of issues were developing with our front office staff.    Melissa Napoleon, MD 2/22/20166:05 PM

## 2014-12-31 NOTE — Patient Instructions (Addendum)
1. Schedule MRI brain with and without contrast 2. Continue with increasing Cymbalta dose 3. Minimize Tylenol and hydrocodone intake to 2-3 tablets a week to avoid rebound headaches 4. Keep a calendar of your headaches 5. Follow-up in 2 months

## 2014-12-31 NOTE — Progress Notes (Signed)
NEUROLOGY CONSULTATION NOTE  Melissa Morgan MRN: [id number] DOB: January 14, 1966  Referring provider: Dr. Ashok Pall Primary care provider: Dr. Annye Asa  Reason for consult:  headaches  Dear Dr Christella Noa:  Thank you for your kind referral of Melissa Morgan for consultation of the above symptoms. Although her history is well known to you, please allow me to reiterate it for the purpose of our medical record. The patient was accompanied to the clinic by her daughter who also provides collateral information. Records and images were personally reviewed where available.  HISTORY OF PRESENT ILLNESS: This is a 49 year old right-handed woman with a history of right MCA aneurysm s/p clipping, presenting for worsening headaches that she and her daughter report started post-op last August 2015. She reports a history of headaches for years, similar to her current headaches, but significantly less frequent, occurring 1-2 times a month. At one point during that time, she had a bad headache 1-1/2 years ago and was told she had a lacunar infarct in a hospital in [place]. She was getting injections which were not helping her. In July 2015, she presented to the ER with a day of severe right-sided headache with left hand numbness and tingling, dizziness, palpitations, diaphoresis, shortness of breath. She had a head CT concerning for aneurysm and was transferred to [facility]. I personally reviewed MRI/MRA head done which showed a 9.24mm right MCA bifurcation aneurysm, no acute hemorrhage. She underwent clipping in August 2015, and reports that since then the headaches increased in frequency, occurring on a near-daily basis. Initially she had difficulty opening her jaw, this has improved. She denies any difficulty chewing or swallowing. Pain is over the right fronto-temporal region, radiating to the back of her head, with sharp stabbing pain, and constant pressure. She describes a jabbing knife pain on the left side  or a needle poking in her eye. She has a constant 2 over 10 nagging headache, then throughout the day has intermittent knife jabbing going up to 10/10 pain 1-2 times a week where she just goes to bed. These are associated with nausea, photo and phonophobia, similar to her migraines. Headaches can last 2-3 days, throughout the day. She has been taking prn hydrocodone and ran out of this a couple of days ago, reporting it makes her feel like her head is swelling up. She takes Tylenol in between the hydrocodone, taking 4-6 tablets almost daily. She takes prn Zofran for nausea.  Her daughter expressed concern that she is "not my mom" any more. She had a DVT after the surgery and is currently on Xarelto. She is tired all the time. She just wants to be alone, and has difficulty dealing with things that are not structured. 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 She has occasional numbness in the median nerve distribution of her left hand. She denies any diplopia but reports blurred vision when headaches worsen. No dysarthria or dysphagia. She has occasional neck pain, no back pain. She has 5-8 hours of unrefreshing sleep, worse since the surgery. Her daughter reports she snores and would wake herself up, but has had a normal sleep study. She was recently started on Cymbalta, and is tapering off Wellbutrin. She reports "terrible anxiety" since the surgery, "I feel like I'm being choked any my head will explode." This would then trigger a headache. This is associated with  palpitations, numbness in her hand, which resolves when she sits still and takes Xanax. There is no family history of cerebral aneurysms.  Laboratory Data: CBC Latest Ref Rng 12/31/2014 12/19/2014 10/01/2014  WBC 3.9 - 10.0 10e3/uL 6.5 6.3 5.8  Hemoglobin 11.6 - 15.9 g/dL 12.5 13.1 13.4  Hematocrit 34.8 - 46.6 %  38.1 39.2 41.4  Platelets 145 - 400 10e3/uL 210 236.0 226.0      Chemistry      Component Value Date/Time   NA 144 12/31/2014 1329   NA 138 10/01/2014 1032   K 3.8 12/31/2014 1329   K 4.3 10/01/2014 1032   CL 100 12/31/2014 1329   CL 101 10/01/2014 1032   CO2 30 12/31/2014 1329   CO2 28 10/01/2014 1032   BUN 11 12/31/2014 1329   BUN 12 10/01/2014 1032   CREATININE 1.0 12/31/2014 1329   CREATININE 0.9 10/01/2014 1032      Component Value Date/Time   CALCIUM 9.3 12/31/2014 1329   CALCIUM 9.7 10/01/2014 1032   ALKPHOS 59 12/31/2014 1329   ALKPHOS 67 10/01/2014 1032   AST 24 12/31/2014 1329   AST 18 10/01/2014 1032   ALT 22 12/31/2014 1329   ALT 16 10/01/2014 1032   BILITOT 0.70 12/31/2014 1329   BILITOT 0.6 10/01/2014 1032      PAST MEDICAL HISTORY: Past Medical History  Diagnosis Date  . Depression   . Tachycardia   . Anxiety   . Lacunar infarction   . Endometriosis   . Headache   . PONV (postoperative nausea and vomiting)     extremely sick  . Stroke     cva  14  . GERD (gastroesophageal reflux disease)     occ  . Seizures     tx 5 yrs in middle school  continuous  . Arthritis   . Brain aneurysm   . DVT (deep venous thrombosis)     PAST SURGICAL HISTORY: Past Surgical History  Procedure Laterality Date  . Lasar laparoscopy      for endometriosis x3  . Craniotomy Right 07/06/2014    Procedure: CRANIOTOMY INTRACRANIAL ANEURYSM FOR CAROTID;  Surgeon: Ashok Pall, MD;  Location: [facility];  Service: Neurosurgery;  Laterality: Right;  right     MEDICATIONS: Current Outpatient Prescriptions on File Prior to Visit  Medication Sig Dispense Refill  . ALPRAZolam (XANAX) 0.5 MG tablet Take 1 tablet (0.5 mg total) by mouth 2 (two) times daily as needed for anxiety. 60 tablet 1  . DULoxetine (CYMBALTA) 30 MG capsule Take 1 capsule (30 mg total) by mouth daily. 30 capsule 3  . metoprolol tartrate (LOPRESSOR) 25 MG tablet Take 25 mg by mouth at bedtime.    .  Ondansetron HCl (ZOFRAN PO) Take 4 mg by mouth as needed.     . pantoprazole (PROTONIX) 40 MG tablet Take 1 tablet (40 mg total) by mouth daily. 30 tablet 3  . traZODone (DESYREL) 50 MG tablet Take 0.5-1 tablets (25-50 mg total) by mouth at bedtime as needed for sleep. 30 tablet 3  . rivaroxaban (XARELTO) 10 MG TABS tablet Take 0.5 tablets (5 mg total) by mouth daily with supper. 30 tablet 6   No current facility-administered medications on file prior to visit.    ALLERGIES: Allergies  Allergen Reactions  . Imitrex [Sumatriptan] Other (See Comments)    Increase heart rate and chest pain   . Dilaudid [Hydromorphone Hcl] Nausea And Vomiting  . Ace Inhibitors Other (See Comments)  REACTION: unknown  . Codeine Nausea Only  . Latex Itching    FAMILY HISTORY: Family History  Problem Relation Age of Onset  . Hypertension Brother     2 out of 3 brothers  . Heart attack Brother     2 out of 3 brothers  . Diabetes Brother   . Asthma Sister   . Hypertension Mother   . Heart attack Father   . Breast cancer Sister     SOCIAL HISTORY: History   Social History  . Marital Status: Legally Separated    Spouse Name: N/A  . Number of Children: N/A  . Years of Education: N/A   Occupational History  . Not on file.   Social History Main Topics  . Smoking status: Former Smoker -- 0.00 packs/day for 30 years    Start date: 12/28/1983    Quit date: 05/26/2014  . Smokeless tobacco: Never Used     Comment: quit smoking 3 months ago  . Alcohol Use: 0.0 oz/week    0 Standard drinks or equivalent per week     Comment: Occasional  . Drug Use: No  . Sexual Activity: Not on file   Other Topics Concern  . Not on file   Social History Narrative    REVIEW OF SYSTEMS: Constitutional: No fevers, chills, or sweats, no generalized fatigue, change in appetite Eyes: No visual changes, double vision, eye pain Ear, nose and throat: No hearing loss, ear pain, nasal congestion, sore  throat Cardiovascular: No chest pain, palpitations Respiratory:  No shortness of breath at rest or with exertion, wheezes GastrointestinaI: No nausea, vomiting, diarrhea, abdominal pain, fecal incontinence Genitourinary:  No dysuria, urinary retention or frequency Musculoskeletal:  + neck pain,no back pain Integumentary: No rash, pruritus, skin lesions Neurological: as above Psychiatric: No depression, insomnia, anxiety Endocrine: No palpitations, fatigue, diaphoresis, mood swings, change in appetite, change in weight, increased thirst Hematologic/Lymphatic:  No anemia, purpura, petechiae. Allergic/Immunologic: no itchy/runny eyes, nasal congestion, recent allergic reactions, rashes  PHYSICAL EXAM: Filed Vitals:   12/31/14 0837  BP: 118/82  Pulse: 79  Resp: 16   General: No acute distress Head:  Normocephalic/atraumatic, right temporalis atrophy Eyes: Fundoscopic exam shows bilateral sharp discs, no vessel changes, exudates, or hemorrhages Neck: supple, no paraspinal tenderness, full range of motion Back: No paraspinal tenderness Heart: regular rate and rhythm Lungs: Clear to auscultation bilaterally. Vascular: No carotid bruits. Skin/Extremities: No rash, no edema Neurological Exam: Mental status: alert and oriented to person, place, and time, no dysarthria or aphasia, Fund of knowledge is appropriate.  Recent and remote memory are intact.  Attention and concentration are normal.    Able to name objects and repeat phrases. Cranial nerves: CN I: not tested CN II: pupils equal, round and reactive to light, visual fields intact, fundi unremarkable. CN III, IV, VI:  full range of motion, no nystagmus, no ptosis CN V: facial sensation intact CN VII: upper and lower face symmetric CN VIII: hearing intact to finger rub CN IX, X: gag intact, uvula midline CN XI: sternocleidomastoid and trapezius muscles intact CN XII: tongue midline Bulk & Tone: normal, no fasciculations. Motor:  5/5 throughout with no pronator drift. Sensation: intact to light touch, cold, pin, vibration and joint position sense.  No extinction to double simultaneous stimulation.  Romberg test negative Deep Tendon Reflexes: +2 throughout, no ankle clonus Plantar responses: downgoing bilaterally Cerebellar: no incoordination on finger to nose, heel to shin. No dysdiadochokinesia Gait: narrow-based and steady, able to tandem walk  adequately. Tremor: none  IMPRESSION: This is a 49 year old right-handed woman with a history of migraines, right MCA aneurysm s/p clipping, DVT, anxiety, presenting for evaluation of increased headaches since surgery in August 2015. There are some migrainous features to her headaches, however since these have worsened recently, MRI brain with and without contrast will be ordered to assess for underlying structural abnormality. There is likely a component of medication overuse as well. She knows to minimize intake of Tylenol and hydrocodone to avoid rebound headaches. She has recently started Cymbalta, which can also help with headache prophylaxis, continue with plan for uptitration. We discussed anxiety and depression, and how this can magnify symptoms as well. She was will keep a calendar of her headaches and follow-up in 2 months.   Thank you for allowing me to participate in the care of this patient. Please do not hesitate to call for any questions or concerns.   Ellouise Newer, M.D.  CC: Dr. Birdie Riddle, Dr. Christella Noa

## 2014-12-31 NOTE — Telephone Encounter (Signed)
Called Dr. Lacy Duverney office to verify ok for patient to have MRI Brain due to patient having aneurysm clips/plate/screws.[facility] unsure if they could Do MRI because of this. At the time of my phone call he was in the room with a patient. His nurse said she would ask him if it was ok for patient to have MRI & she would call me back.   We did receive a return call back from Dr. Christella Noa himself, I did speak with him and he verified that patient was ok to have MRI.  I will proceed with scheduling scan.

## 2014-12-31 NOTE — Telephone Encounter (Signed)
Mailed may schedule

## 2015-01-02 LAB — D-DIMER, QUANTITATIVE: D-Dimer, Quant: 0.45 ug/mL-FEU (ref 0.00–0.48)

## 2015-01-04 ENCOUNTER — Other Ambulatory Visit: Payer: Self-pay | Admitting: *Deleted

## 2015-01-06 ENCOUNTER — Encounter: Payer: Self-pay | Admitting: Neurology

## 2015-01-09 ENCOUNTER — Encounter: Payer: Self-pay | Admitting: Family Medicine

## 2015-01-09 DIAGNOSIS — N644 Mastodynia: Secondary | ICD-10-CM

## 2015-01-10 NOTE — Telephone Encounter (Signed)
Order placed for [facility]

## 2015-01-11 ENCOUNTER — Ambulatory Visit
Admission: RE | Admit: 2015-01-11 | Discharge: 2015-01-11 | Disposition: A | Discharge status: Home / Self Care | Payer: BLUE CROSS/BLUE SHIELD | Source: Ambulatory Visit | Attending: Family Medicine | Admitting: Family Medicine

## 2015-01-11 ENCOUNTER — Other Ambulatory Visit: Payer: BLUE CROSS/BLUE SHIELD

## 2015-01-11 ENCOUNTER — Other Ambulatory Visit: Payer: Self-pay | Admitting: Family Medicine

## 2015-01-11 DIAGNOSIS — N644 Mastodynia: Secondary | ICD-10-CM

## 2015-01-11 DIAGNOSIS — R921 Mammographic calcification found on diagnostic imaging of breast: Secondary | ICD-10-CM

## 2015-01-15 ENCOUNTER — Ambulatory Visit (HOSPITAL_COMMUNITY)
Admission: RE | Admit: 2015-01-15 | Discharge: 2015-01-15 | Disposition: A | Discharge status: Home / Self Care | Payer: BLUE CROSS/BLUE SHIELD | Source: Ambulatory Visit | Attending: Neurology | Admitting: Neurology

## 2015-01-15 DIAGNOSIS — Z9889 Other specified postprocedural states: Secondary | ICD-10-CM | POA: Diagnosis not present

## 2015-01-15 DIAGNOSIS — R51 Headache: Secondary | ICD-10-CM | POA: Diagnosis not present

## 2015-01-15 MED ORDER — GADOBENATE DIMEGLUMINE 529 MG/ML IV SOLN
20.0000 mL | Freq: Once | INTRAVENOUS | Status: AC | PRN
  Administered 2015-01-15: 18 mL via INTRAVENOUS

## 2015-01-16 ENCOUNTER — Telehealth: Payer: Self-pay | Admitting: Neurology

## 2015-01-16 ENCOUNTER — Encounter: Payer: Self-pay | Admitting: Family Medicine

## 2015-01-16 NOTE — Telephone Encounter (Signed)
Pls let her know I reviewed MRI brain, it is unremarkable, no evidence of bleed, tumor, stroke, or swelling around the site of the surgery. Thanks

## 2015-01-16 NOTE — Telephone Encounter (Signed)
Pt called wanting to f/u on her MRI results. C/b [phone]

## 2015-01-16 NOTE — Telephone Encounter (Signed)
Discussed MRI brain, no acute findings. She has multiple complaints. When focused on a conversation, words are here, then when she starts stuttering, starts being unable to get words out and frustrated, and now moody and wants to be alone. A lot on her plate, had been planning separation from her husband. Gets so flustered, accomplishes one thing, then the day is gone, and more frustrated. Fatigue so easily, I "could just lie down and die."  Encouraged to continue with psychotherapy, physical/cognitive rest, yoga, we discussed that a lot of her symptoms are likely due to depression magnifying headache symptoms. Continue with plan to increase Cymbalta.

## 2015-01-16 NOTE — Telephone Encounter (Signed)
I spoke with her about her mri results. She wants to know what the next steps are or plan is to figure out why she is having the headaches. She states that she is having memory issues and having trouble finding her words. She states that she isn't sure if these issues are from the surgery she had or something else. She states she knows she has a hx of migraines but nothing like these headaches she is having now. She hasn't started the titration up of the Cymbalta she is going to call her pcp to find out if she should go ahead and start increasing. Please advise.

## 2015-01-16 NOTE — Telephone Encounter (Signed)
Have you had a chance to look at her MRI?

## 2015-01-17 ENCOUNTER — Other Ambulatory Visit: Payer: Self-pay | Admitting: Family Medicine

## 2015-01-17 ENCOUNTER — Ambulatory Visit
Admission: RE | Admit: 2015-01-17 | Discharge: 2015-01-17 | Disposition: A | Discharge status: Home / Self Care | Payer: BLUE CROSS/BLUE SHIELD | Source: Ambulatory Visit | Attending: Family Medicine | Admitting: Family Medicine

## 2015-01-17 DIAGNOSIS — R921 Mammographic calcification found on diagnostic imaging of breast: Secondary | ICD-10-CM

## 2015-01-17 DIAGNOSIS — N644 Mastodynia: Secondary | ICD-10-CM

## 2015-01-17 MED ORDER — DULOXETINE HCL 60 MG PO CPEP: 60.0000 mg | ORAL_CAPSULE | Freq: Every day | ORAL | Status: DC

## 2015-01-29 ENCOUNTER — Other Ambulatory Visit: Payer: Self-pay | Admitting: Family Medicine

## 2015-01-30 ENCOUNTER — Telehealth: Payer: Self-pay | Admitting: *Deleted

## 2015-01-30 NOTE — Telephone Encounter (Signed)
Yes move appt up pls, thanks

## 2015-01-30 NOTE — Telephone Encounter (Signed)
Patient has a lot of concerns for Dr. Delice Morgan 1. She has questions about 2 CAT scans that use to be on her my chart account and has been taking down. She would like to know if she reviewed it or not. 2. Working? Part time or full time 3. Stress, medication 4. Headaches ( really bad, sharp lighting bolt feeling) not break through pain  And list goes on and on Call back number [phone]

## 2015-01-30 NOTE — Telephone Encounter (Signed)
Do you want to me to move her appt up from (4/25) to discuss these issues or do you want to speak with her over the phone?

## 2015-01-30 NOTE — Telephone Encounter (Signed)
Med filled.  

## 2015-01-31 NOTE — Telephone Encounter (Signed)
Tried calling patient, no answer vm was not set up. Will try again later.

## 2015-01-31 NOTE — Telephone Encounter (Signed)
I spoke with patient. She was put on the schedule for Wed 3/30 @ 2:30 pm. She was asking for pain medicine. I explained to her that Dr. Delice Lesch was out of the office & wouldn't be back until Mon due to the office being closed tomorrow. I did advise for her to call her pcp to see if she could get something for pain. She asked if we could call Dr. Lacy Duverney office & ask for pain medication for her. I did tell her that I couldn't do that but she could call them herself to see if she could get Rx for pain medication. She also mentioned she is worried about having mini strokes given her hx, I did advise patient that if she starts having any of the sxs she's had in the past she needs to go to an ER immediately. Patient verbalized good understanding.

## 2015-02-04 ENCOUNTER — Encounter: Payer: Self-pay | Admitting: Family Medicine

## 2015-02-04 ENCOUNTER — Telehealth: Payer: Self-pay | Admitting: Family Medicine

## 2015-02-04 NOTE — Telephone Encounter (Signed)
Caller name:Havlin, Kechia Relation to YF:VCBS Call back [phone] Pharmacy:[facility] on [address]  Reason for call:  Pt states she is needing rx for pantoprazole (PROTONIX) 40 MG tablet  And   ALPRAZolam (XANAX) 0.5 MG tablet

## 2015-02-05 ENCOUNTER — Telehealth: Payer: Self-pay | Admitting: General Practice

## 2015-02-05 MED ORDER — PANTOPRAZOLE SODIUM 40 MG PO TBEC: 40.0000 mg | DELAYED_RELEASE_TABLET | Freq: Every day | ORAL | Status: DC

## 2015-02-05 NOTE — Telephone Encounter (Signed)
Ok for alprazolam #60, 1 refill.  Pt needs rheum referral for polyarthralgia

## 2015-02-05 NOTE — Telephone Encounter (Signed)
Per email received by pt, she was triaged by Virgilio Frees, Rn at our office. Pt advised that she woul like a follow up appt with Tabori. Ok to use 02/07/15 11am for pt.

## 2015-02-05 NOTE — Telephone Encounter (Signed)
Spoke to patient regarding symptoms.  She states that she feels "puny."  She complains of "achy bones" and that she "hurts all over" for a week and a half.  She reports trouble sleeping.  She states that her Neurologist called in Tramadol, but she is unable to take it because it makes her foggy/confused, she was unable to have a conversation.  She has taken about 6 tylenol throughout the day instead.  She is feeling very weak/tired- states she typically fatigues easily but it is worse.  She also complains that the panic attacks are still occuring.  Pain is mostly in her joints, especially thumb.  She is also still having nausea and requested Protonix refill (Jessical sent).    Follow-up appointment scheduled for 3/31 at 11:00.

## 2015-02-05 NOTE — Telephone Encounter (Signed)
Encounter closed, mychart message with same information routed to provider.

## 2015-02-05 NOTE — Telephone Encounter (Signed)
Last OV 12/19/14 Alprazolam last filled 12/04/14 #60 with 0

## 2015-02-06 ENCOUNTER — Ambulatory Visit (INDEPENDENT_AMBULATORY_CARE_PROVIDER_SITE_OTHER): Payer: BLUE CROSS/BLUE SHIELD | Admitting: Neurology

## 2015-02-06 ENCOUNTER — Encounter: Payer: Self-pay | Admitting: Neurology

## 2015-02-06 ENCOUNTER — Telehealth: Payer: Self-pay | Admitting: Family Medicine

## 2015-02-06 VITALS — BP 118/80 | HR 74 | Resp 16 | Ht 68.0 in | Wt 195.0 lb

## 2015-02-06 DIAGNOSIS — I671 Cerebral aneurysm, nonruptured: Secondary | ICD-10-CM

## 2015-02-06 DIAGNOSIS — R51 Headache: Secondary | ICD-10-CM

## 2015-02-06 DIAGNOSIS — R519 Headache, unspecified: Secondary | ICD-10-CM

## 2015-02-06 MED ORDER — ALPRAZOLAM 0.5 MG PO TABS: 0.5000 mg | ORAL_TABLET | Freq: Two times a day (BID) | ORAL | Status: DC | PRN

## 2015-02-06 MED ORDER — NORTRIPTYLINE HCL 10 MG PO CAPS: ORAL_CAPSULE | ORAL | Status: DC

## 2015-02-06 NOTE — Patient Instructions (Signed)
1. Start nortriptyline 10mg : Take 1 capsule at bedtime for 1 week, then increase to 2 capsules at bedtime 2. Continue Cymbalta, continue to monitor symptoms 3. Strongly recommend seeing a therapist for depression and anxiety 4. Follow-up in 2 months

## 2015-02-06 NOTE — Telephone Encounter (Signed)
Med filled and faxed.  

## 2015-02-06 NOTE — Progress Notes (Signed)
NEUROLOGY FOLLOW UP OFFICE NOTE  Melissa Morgan [id number]  HISTORY OF PRESENT ILLNESS: I had the pleasure of seeing Melissa Morgan in follow-up in the neurology clinic on 02/06/2015.  The patient was last seen a month ago for worsening headaches since her surgery in August 2015. She is again accompanied by her daughter who helps supplement the history today. She had called our office with multiple concerns and was scheduled for an earlier visit to address her questions. She is very anxious and agitated in the office today. I personally reviewed her 2 previous head CT scans and recent MRI brain with the patient and her daughter today. We discussed her previous scans and the MRI brain which shows post-surgical clipping of the right MCA aneurysm, artifact in this region which she was very concerned about. She was also concerned about the lacunar infarct that was found when she was living in [place], we went over this as well.  On her initial visit, Cymbalta was recently started, she states this is not helping with her head pain, she does not consider it a headache, she considers it as a pain, with burning on the right side of her head, like a rod is twisting there. When the pain becomes severe, she would have a migraine. These occur when she is upset or stressed, when she becomes exhausted after overdoing things. The last time she had a bad migraine was in February. She is dizzy all day, going up the elevator makes it worse. She feels like she is in a fog, she is more hyper, noticed more when she increased the Cymbalta dose.   HPI: This is a 49 yo RH woman with a history of right MCA aneurysm s/p clipping, who presented with worsening headaches that she and her daughter report started post-op last August 2015. She reports a history of headaches for years, similar to her current headaches, but significantly less frequent, occurring 1-2 times a month. At one point during that time, she had a bad headache  1-1/2 years ago and was told she had a lacunar infarct in a hospital in [place]. She was getting injections which were not helping her. In July 2015, she presented to the ER with a day of severe right-sided headache with left hand numbness and tingling, dizziness, palpitations, diaphoresis, shortness of breath. She had a head CT concerning for aneurysm and was transferred to [facility]. I personally reviewed MRI/MRA head done which showed a 9.42mm right MCA bifurcation aneurysm, no acute hemorrhage. She underwent clipping in August 2015, and reports that since then the headaches increased in frequency, occurring on a near-daily basis. Initially she had difficulty opening her jaw, this has improved. She denies any difficulty chewing or swallowing. Pain is over the right fronto-temporal region, radiating to the back of her head, with sharp stabbing pain, and constant pressure. She describes a jabbing knife pain on the left side or a needle poking in her eye. She has a constant 2 over 10 nagging headache, then throughout the day has intermittent knife jabbing going up to 10/10 pain 1-2 times a week where she just goes to bed. These are associated with nausea, photo and phonophobia, similar to her migraines. Headaches can last 2-3 days, throughout the day. She has been taking prn hydrocodone but stated it makes her feel like her head is swelling up. She takes Tylenol in between the hydrocodone, taking 4-6 tablets almost daily. She takes prn Zofran for nausea.  Her daughter expressed concern that  she is "not my mom" any more. She had a DVT after the surgery and is currently on Xarelto. She is tired all the time. She just wants to be alone, and has difficulty dealing with things that are not structured. 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 She has occasional numbness in the median  nerve distribution of her left hand. She denies any diplopia but reports blurred vision when headaches worsen. No dysarthria or dysphagia. She has occasional neck pain, no back pain. She has 5-8 hours of unrefreshing sleep, worse since the surgery. Her daughter reports she snores and would wake herself up, but has had a normal sleep study. She was recently started on Cymbalta, and is tapering off Wellbutrin. She reports "terrible anxiety" since the surgery, "I feel like I'm being choked any my head will explode." This would then trigger a headache. This is associated with palpitations, numbness in her hand, which resolves when she sits still and takes Xanax. There is no family history of cerebral aneurysms.  PAST MEDICAL HISTORY: Past Medical History  Diagnosis Date  . Depression   . Tachycardia   . Anxiety   . Lacunar infarction   . Endometriosis   . Headache   . PONV (postoperative nausea and vomiting)     extremely sick  . Stroke     cva  14  . GERD (gastroesophageal reflux disease)     occ  . Seizures     tx 5 yrs in middle school  continuous  . Arthritis   . Brain aneurysm   . DVT (deep venous thrombosis)     MEDICATIONS: Current Outpatient Prescriptions on File Prior to Visit  Medication Sig Dispense Refill  . ALPRAZolam (XANAX) 0.5 MG tablet Take 1 tablet (0.5 mg total) by mouth 2 (two) times daily as needed for anxiety. 60 tablet 1  . DULoxetine (CYMBALTA) 60 MG capsule Take 1 capsule (60 mg total) by mouth daily. 30 capsule 3  . metoprolol tartrate (LOPRESSOR) 25 MG tablet Take 25 mg by mouth at bedtime.    . Ondansetron HCl (ZOFRAN PO) Take 4 mg by mouth as needed.     . pantoprazole (PROTONIX) 40 MG tablet Take 1 tablet (40 mg total) by mouth daily. 30 tablet 3  . rivaroxaban (XARELTO) 10 MG TABS tablet Take 0.5 tablets (5 mg total) by mouth daily with supper. 30 tablet 6  . traZODone (DESYREL) 50 MG tablet TAKE 1/2 TO 1 TABLET BY MOUTH AT BEDTIME AS NEEDED FOR SLEEP 30  tablet 3   No current facility-administered medications on file prior to visit.    ALLERGIES: Allergies  Allergen Reactions  . Imitrex [Sumatriptan] Other (See Comments)    Increase heart rate and chest pain   . Dilaudid [Hydromorphone Hcl] Nausea And Vomiting  . Ace Inhibitors Other (See Comments)    REACTION: unknown  . Tramadol Other (See Comments)    Palpitations; Dizziness  . Codeine Nausea Only  . Latex Itching    FAMILY HISTORY: Family History  Problem Relation Age of Onset  . Hypertension Brother     2 out of 3 brothers  . Heart attack Brother     2 out of 3 brothers  . Diabetes Brother   . Asthma Sister   . Hypertension Mother   . Heart attack Father   . Breast cancer Sister  SOCIAL HISTORY: History   Social History  . Marital Status: Legally Separated    Spouse Name: N/A  . Number of Children: N/A  . Years of Education: N/A   Occupational History  . Not on file.   Social History Main Topics  . Smoking status: Former Smoker -- 0.00 packs/day for 30 years    Start date: 12/28/1983    Quit date: 05/26/2014  . Smokeless tobacco: Never Used     Comment: quit smoking 3 months ago  . Alcohol Use: 0.0 oz/week    0 Standard drinks or equivalent per week     Comment: Occasional  . Drug Use: No  . Sexual Activity: Not on file   Other Topics Concern  . Not on file   Social History Narrative    REVIEW OF SYSTEMS: Constitutional: No fevers, chills, or sweats, + generalized fatigue, change in appetite Eyes: No visual changes, double vision, eye pain Ear, nose and throat: No hearing loss, ear pain, nasal congestion, sore throat Cardiovascular: No chest pain, palpitations Respiratory:  No shortness of breath at rest or with exertion, wheezes GastrointestinaI: No nausea, vomiting, diarrhea, abdominal pain, fecal incontinence Genitourinary:  No dysuria, urinary retention or frequency Musculoskeletal:  No neck pain, back pain Integumentary: No rash,  pruritus, skin lesions Neurological: as above Psychiatric: + depression, insomnia,++ anxiety Endocrine: No palpitations, fatigue, diaphoresis, mood swings, change in appetite, change in weight, increased thirst Hematologic/Lymphatic:  No anemia, purpura, petechiae. Allergic/Immunologic: no itchy/runny eyes, nasal congestion, recent allergic reactions, rashes  PHYSICAL EXAM: Filed Vitals:   02/06/15 1434  BP: 118/80  Pulse: 74  Resp: 16   General: No acute distress, very anxious and agitated, becomes tearful during the visit Head:  Normocephalic/atraumatic Neck: supple, no paraspinal tenderness, full range of motion Heart:  Regular rate and rhythm Lungs:  Clear to auscultation bilaterally Back: No paraspinal tenderness Skin/Extremities: No rash, no edema Neurological Exam: alert and oriented to person, place, and time. No aphasia or dysarthria. Fund of knowledge is appropriate.  Recent and remote memory are intact.  Attention and concentration are normal.    Able to name objects and repeat phrases. Cranial nerves: Pupils equal, round, reactive to light.  Fundoscopic exam unremarkable, no papilledema. Extraocular movements intact with no nystagmus. Visual fields full. Facial sensation intact. No facial asymmetry. Tongue, uvula, palate midline.  Motor: Bulk and tone normal, muscle strength 5/5 throughout with no pronator drift.  Sensation to light touch intact.  No extinction to double simultaneous stimulation.  Deep tendon reflexes 2+ throughout, toes downgoing.  Finger to nose testing intact.  Gait narrow-based and steady, able to tandem walk adequately.  Romberg negative.  IMPRESSION: This is a 49 yo RH woman with a history of migraines, right MCA aneurysm s/p clipping, DVT, anxiety, with increased headaches since surgery in August 2015. There are some migrainous features to her headaches, however today she reports that these are different that her migraines, describing a burning stabbing  pain suggestive of neuralgia. Cymbalta has not helped much. We discussed treatment options, she may benefit from addition of nortriptyline, which hopefully will help with sleep and anxiety as well. She will start low dose, with uptitration as tolerated. Side effects were discussed. I again discussed her significant anxiety and depression, which are likely magnifying her symptoms, strongly recommending she see a behavioral specialist. She expressed agreement. We discussed the previous lacunar stroke on her scan, secondary stroke prevention with control of vascular risk factors, she is already on Xarelto for  DVT as well. She was also instructed to minimize intake of Tylenol and hydrocodone to avoid rebound headaches. She will keep a calendar of her headaches and follow-up in 2 months.   Thank you for allowing me to participate in her care.  Please do not hesitate to call for any questions or concerns.  The duration of this appointment visit was 25 minutes of face-to-face time with the patient.  Greater than 50% of this time was spent in counseling, explanation of diagnosis, planning of further management, and coordination of care.   Ellouise Newer, M.D.   CC: Dr. Birdie Riddle

## 2015-02-06 NOTE — Telephone Encounter (Signed)
This request was sent to provider will wait until she approves it.

## 2015-02-06 NOTE — Telephone Encounter (Signed)
Caller name: Melodi, Happel Relation to pt: self  Call back number: [phone]   Reason for call:  Pt calling regarding her ALPRAZolam [name]) 0.5 MG tablet  request

## 2015-02-07 ENCOUNTER — Encounter: Payer: Self-pay | Admitting: Family Medicine

## 2015-02-07 ENCOUNTER — Ambulatory Visit (INDEPENDENT_AMBULATORY_CARE_PROVIDER_SITE_OTHER): Payer: BLUE CROSS/BLUE SHIELD | Admitting: Family Medicine

## 2015-02-07 VITALS — BP 122/80 | HR 78 | Resp 16 | Wt 193.0 lb

## 2015-02-07 DIAGNOSIS — M2550 Pain in unspecified joint: Secondary | ICD-10-CM

## 2015-02-07 DIAGNOSIS — F418 Other specified anxiety disorders: Secondary | ICD-10-CM | POA: Diagnosis not present

## 2015-02-07 NOTE — Progress Notes (Signed)
Pre visit review using our clinic review tool, if applicable. No additional management support is needed unless otherwise documented below in the visit note. 

## 2015-02-07 NOTE — Patient Instructions (Signed)
Follow up as needed We'll call you with your Rheumatology appt I'm so glad that the Nortriptyline is helping!  This is great news! Call with any questions or concerns Hang in there! Happy Early Rudene Anda!

## 2015-02-07 NOTE — Progress Notes (Signed)
   Subjective:    Patient ID: Melissa Morgan, female    DOB: [birth year], 49 y.o.   MRN: [id number]  HPI Polyarthralgia- 'same old, same old.  Just hurtin' all over'.  Started Nortriptyline last night (prescribed by Neuro).  Pt reports she has had years of migrating pain in muscles and joints.  Pain will come in 'flares'.  + fatigue.  Pt has hx of elevated ESR 'from at least 2009'.  Anxiety- currently on Cymbalta $RemoveBef'60mg'cSUwcNEXsu$  and was started on Nortriptyline yesterday.  Pt reports improved sleep last night.   Review of Systems For ROS see HPI     Objective:   Physical Exam  Constitutional: She is oriented to person, place, and time. She appears well-developed and well-nourished. No distress.  HENT:  Head: Normocephalic and atraumatic.  Musculoskeletal: She exhibits no edema.  Neurological: She is alert and oriented to person, place, and time. She has normal reflexes. No cranial nerve deficit. Coordination normal.  Skin: Skin is warm and dry.  Psychiatric: She has a normal mood and affect. Her behavior is normal. Thought content normal.  Vitals reviewed.         Assessment & Plan:

## 2015-02-10 NOTE — Assessment & Plan Note (Signed)
Chronic problem.  Agree w/ Nortriptyline as migraine prophylaxis as this will also help pt's anxiety.  Will follow.

## 2015-02-10 NOTE — Assessment & Plan Note (Signed)
Ongoing issue for pt.  Has hx of elevated ESR w/o official dx.  Will refer to rheum for complete evaluation and tx prn.  Pt expressed understanding and is in agreement w/ plan.

## 2015-02-12 ENCOUNTER — Encounter: Payer: Self-pay | Admitting: Neurology

## 2015-02-12 DIAGNOSIS — R519 Headache, unspecified: Secondary | ICD-10-CM | POA: Insufficient documentation

## 2015-02-12 DIAGNOSIS — I671 Cerebral aneurysm, nonruptured: Secondary | ICD-10-CM | POA: Insufficient documentation

## 2015-02-12 DIAGNOSIS — R51 Headache: Principal | ICD-10-CM

## 2015-02-27 ENCOUNTER — Encounter: Payer: Self-pay | Admitting: Neurology

## 2015-03-04 ENCOUNTER — Ambulatory Visit: Payer: BLUE CROSS/BLUE SHIELD | Admitting: Neurology

## 2015-03-05 ENCOUNTER — Encounter: Payer: Self-pay | Admitting: Neurology

## 2015-03-06 ENCOUNTER — Telehealth: Payer: Self-pay | Admitting: Family Medicine

## 2015-03-06 NOTE — Telephone Encounter (Signed)
Called patient to let her know letter she requested is ready for p/u. Left up front.

## 2015-03-10 ENCOUNTER — Emergency Department (HOSPITAL_BASED_OUTPATIENT_CLINIC_OR_DEPARTMENT_OTHER)
Admission: EM | Admit: 2015-03-10 | Discharge: 2015-03-10 | Discharge status: Against Medical Advice | Payer: BLUE CROSS/BLUE SHIELD | Attending: Emergency Medicine | Admitting: Emergency Medicine

## 2015-03-10 ENCOUNTER — Encounter (HOSPITAL_BASED_OUTPATIENT_CLINIC_OR_DEPARTMENT_OTHER): Payer: Self-pay | Admitting: *Deleted

## 2015-03-10 DIAGNOSIS — R51 Headache: Secondary | ICD-10-CM | POA: Diagnosis not present

## 2015-03-10 DIAGNOSIS — R109 Unspecified abdominal pain: Secondary | ICD-10-CM | POA: Insufficient documentation

## 2015-03-10 NOTE — ED Notes (Signed)
MD went to assess the pt and pt had left the ED. Connye Burkitt, RN

## 2015-03-10 NOTE — ED Notes (Signed)
Reports migraine x 2 days, hx of same.  Reports abdominal pain x 3-4 days.

## 2015-03-10 NOTE — ED Notes (Signed)
MD aware that pt has multiple complaints and states he will see her next. Connye Burkitt, RN

## 2015-03-21 ENCOUNTER — Encounter: Payer: Self-pay | Admitting: Family Medicine

## 2015-03-22 ENCOUNTER — Telehealth: Payer: Self-pay | Admitting: *Deleted

## 2015-03-22 NOTE — Telephone Encounter (Signed)
Called patient regarding MyChart Message:  From  Nena Hampe   To  Midge Minium, MD   Sent  03/21/2015 5:55 PM      Dr. Birdie Riddle,   I am still having an abundant amount o f nausea.Marland Kitchenalong with the dang headaches/headpain/pings and pains...Dr. Delice Lesch, I believe is "increasing the dosage of nortriptyline...today I sneezed and felt like something burst in my head and blood just kind spurted out o f my nose; it stopped almost as quick as it started and the nausea set in BAD..I have 2-3 Zofran let from a bad headache I had about a week or two ago  Do I need t continue taking th e Cymbalta? I feel like "duh" nothing is working...  can you or my nurse call me?    Per Dr. Birdie Riddle, patient needs to go to ED if symptoms persist.   Called patient x 2, voicemail box is full- unable to leave message.

## 2015-03-27 ENCOUNTER — Telehealth: Payer: Self-pay | Admitting: Family Medicine

## 2015-03-27 DIAGNOSIS — R51 Headache: Principal | ICD-10-CM

## 2015-03-27 DIAGNOSIS — R519 Headache, unspecified: Secondary | ICD-10-CM

## 2015-03-27 MED ORDER — NORTRIPTYLINE HCL 10 MG PO CAPS: ORAL_CAPSULE | ORAL | Status: DC

## 2015-03-27 NOTE — Telephone Encounter (Signed)
Patient calling to let us know that she is agreeable to increasing her nortriptyline to 50 mg per the my chart email that she sent last week. Will send in new Rx with new directions.

## 2015-04-01 ENCOUNTER — Ambulatory Visit (HOSPITAL_BASED_OUTPATIENT_CLINIC_OR_DEPARTMENT_OTHER): Payer: BLUE CROSS/BLUE SHIELD | Admitting: Hematology & Oncology

## 2015-04-01 ENCOUNTER — Other Ambulatory Visit (HOSPITAL_BASED_OUTPATIENT_CLINIC_OR_DEPARTMENT_OTHER): Payer: BLUE CROSS/BLUE SHIELD

## 2015-04-01 VITALS — BP 113/63 | HR 87 | Temp 98.5°F | Wt 203.0 lb

## 2015-04-01 DIAGNOSIS — I82402 Acute embolism and thrombosis of unspecified deep veins of left lower extremity: Secondary | ICD-10-CM

## 2015-04-01 DIAGNOSIS — Z86718 Personal history of other venous thrombosis and embolism: Secondary | ICD-10-CM

## 2015-04-01 LAB — COMPREHENSIVE METABOLIC PANEL
ALBUMIN: 4.3 g/dL (ref 3.5–5.2)
ALT: 22 U/L (ref 0–35)
AST: 22 U/L (ref 0–37)
Alkaline Phosphatase: 64 U/L (ref 39–117)
BUN: 10 mg/dL (ref 6–23)
CO2: 25 mEq/L (ref 19–32)
Calcium: 9.2 mg/dL (ref 8.4–10.5)
Chloride: 102 mEq/L (ref 96–112)
Creatinine, Ser: 0.83 mg/dL (ref 0.50–1.10)
GLUCOSE: 155 mg/dL — AB (ref 70–99)
POTASSIUM: 3.9 meq/L (ref 3.5–5.3)
Sodium: 137 mEq/L (ref 135–145)
Total Bilirubin: 0.3 mg/dL (ref 0.2–1.2)
Total Protein: 6.9 g/dL (ref 6.0–8.3)

## 2015-04-01 LAB — CBC WITH DIFFERENTIAL (CANCER CENTER ONLY)
BASO#: 0 10*3/uL (ref 0.0–0.2)
BASO%: 0.6 % (ref 0.0–2.0)
EOS ABS: 0.1 10*3/uL (ref 0.0–0.5)
EOS%: 1.8 % (ref 0.0–7.0)
HCT: 39 % (ref 34.8–46.6)
HGB: 13.3 g/dL (ref 11.6–15.9)
LYMPH#: 3.1 10*3/uL (ref 0.9–3.3)
LYMPH%: 43.5 % (ref 14.0–48.0)
MCH: 30.9 pg (ref 26.0–34.0)
MCHC: 34.1 g/dL (ref 32.0–36.0)
MCV: 91 fL (ref 81–101)
MONO#: 0.6 10*3/uL (ref 0.1–0.9)
MONO%: 7.6 % (ref 0.0–13.0)
NEUT%: 46.5 % (ref 39.6–80.0)
NEUTROS ABS: 3.4 10*3/uL (ref 1.5–6.5)
Platelets: 237 10*3/uL (ref 145–400)
RBC: 4.31 10*6/uL (ref 3.70–5.32)
RDW: 13.4 % (ref 11.1–15.7)
WBC: 7.2 10*3/uL (ref 3.9–10.0)

## 2015-04-01 NOTE — Progress Notes (Signed)
Hematology and Oncology Follow Up Visit  Melissa Morgan [id number] 08/30/1966 49 y.o. 04/01/2015   Principle Diagnosis:  Thrombus in the right peroneal vein  2 Current Therapy:   Xarelto 5mg  po q day    Interim History:  [name] is back for follow-up. She is doing okay. She is started to have a migraine. She takes Tylenol for this.  She still under a lot of stress. This is all regarding her ex-husband. She thinks this is also affecting her daughter. Her daughter works over at [place] been no problems with bleeding. She's had no problems with nausea or vomiting.  She has gained weight. She is worried about the weight gain  She has had no leg swelling. There has been no rashes.  She is on the dose Xarelto. I think this is doing pretty well for her.  Overall, her performance status is ECOG 1.   Medications:  Current outpatient prescriptions:  .  ALPRAZolam (XANAX) 0.5 MG tablet, Take 1 tablet (0.5 mg total) by mouth 2 (two) times daily as needed for anxiety., Disp: 60 tablet, Rfl: 1 .  DULoxetine (CYMBALTA) 60 MG capsule, Take 1 capsule (60 mg total) by mouth daily., Disp: 30 capsule, Rfl: 3 .  metoprolol tartrate (LOPRESSOR) 25 MG tablet, Take 25 mg by mouth at bedtime., Disp: , Rfl:  .  nortriptyline (PAMELOR) 10 MG capsule, Take 5 capsules at bedtime, Disp: 150 capsule, Rfl: 3 .  Ondansetron HCl (ZOFRAN PO), Take 4 mg by mouth as needed. , Disp: , Rfl:  .  pantoprazole (PROTONIX) 40 MG tablet, Take 1 tablet (40 mg total) by mouth daily., Disp: 30 tablet, Rfl: 3 .  rivaroxaban (XARELTO) 10 MG TABS tablet, Take 0.5 tablets (5 mg total) by mouth daily with supper., Disp: 30 tablet, Rfl: 6 .  traZODone (DESYREL) 50 MG tablet, TAKE 1/2 TO 1 TABLET BY MOUTH AT BEDTIME AS NEEDED FOR SLEEP, Disp: 30 tablet, Rfl: 3  Allergies:  Allergies  Allergen Reactions  . Imitrex [Sumatriptan] Other (See Comments)    Increase heart rate and chest pain   . Dilaudid  [Hydromorphone Hcl] Nausea And Vomiting  . Ace Inhibitors Other (See Comments)    REACTION: unknown  . Tramadol Other (See Comments)    Palpitations; Dizziness  . Codeine Nausea Only  . Latex Itching    Past Medical History, Surgical history, Social history, and Family History were reviewed and updated.  Review of Systems: As above  Physical Exam:  weight is 203 lb (92.08 kg). Her oral temperature is 98.5 F (36.9 C). Her blood pressure is 113/63 and her pulse is 87.   Well-developed and well-nourished white female. Head and neck exam shows no ocular or oral lesions. She has no palpable cervical or supraclavicular lymph nodes. Lungs are clear. Cardiac exam regular rate and rhythm with no murmurs, rubs or bruits. Abdomen is soft. She has good bowel sounds. There is no fluid wave. There is no palpable liver or spleen tip. Back exam shows no tenderness over the spine, ribs or hips. Extremities shows no clubbing, cyanosis or edema. No swelling is noted in the legs. There is no venous cord in the right leg. She has good range of motion of her joints. Skin exam no rashes, ecchymoses or petechia. Neurological exam is nonfocal.  Lab Results  Component Value Date   WBC 7.2 04/01/2015   HGB 13.3 04/01/2015   HCT 39.0 04/01/2015   MCV 91 04/01/2015  PLT 237 04/01/2015     Chemistry      Component Value Date/Time   NA 144 12/31/2014 1329   NA 138 10/01/2014 1032   K 3.8 12/31/2014 1329   K 4.3 10/01/2014 1032   CL 100 12/31/2014 1329   CL 101 10/01/2014 1032   CO2 30 12/31/2014 1329   CO2 28 10/01/2014 1032   BUN 11 12/31/2014 1329   BUN 12 10/01/2014 1032   CREATININE 1.0 12/31/2014 1329   CREATININE 0.9 10/01/2014 1032      Component Value Date/Time   CALCIUM 9.3 12/31/2014 1329   CALCIUM 9.7 10/01/2014 1032   ALKPHOS 59 12/31/2014 1329   ALKPHOS 67 10/01/2014 1032   AST 24 12/31/2014 1329   AST 18 10/01/2014 1032   ALT 22 12/31/2014 1329   ALT 16 10/01/2014 1032   BILITOT  0.70 12/31/2014 1329   BILITOT 0.6 10/01/2014 1032         Impression and Plan: Melissa Morgan is 49 year old white female with a post surgical thrombus in the right leg. This has resolved.   She is doing well with low-dose Xarelto. I really think this is appropriate for her.  I don't see any need for any studies right now.  We will plan to get her back in 3-4 months. We will try to get her through the summertime. t   Volanda Napoleon, MD 5/23/20163:47 PM

## 2015-04-03 ENCOUNTER — Encounter: Payer: Self-pay | Admitting: Family Medicine

## 2015-04-03 LAB — D-DIMER, QUANTITATIVE (NOT AT ARMC): D DIMER QUANT: 0.27 ug{FEU}/mL (ref 0.00–0.48)

## 2015-04-04 ENCOUNTER — Ambulatory Visit (INDEPENDENT_AMBULATORY_CARE_PROVIDER_SITE_OTHER): Payer: BLUE CROSS/BLUE SHIELD | Admitting: Licensed Clinical Social Worker

## 2015-04-04 DIAGNOSIS — F332 Major depressive disorder, recurrent severe without psychotic features: Secondary | ICD-10-CM

## 2015-04-12 ENCOUNTER — Ambulatory Visit (INDEPENDENT_AMBULATORY_CARE_PROVIDER_SITE_OTHER): Payer: BLUE CROSS/BLUE SHIELD | Admitting: Neurology

## 2015-04-12 ENCOUNTER — Encounter: Payer: Self-pay | Admitting: Neurology

## 2015-04-12 VITALS — BP 122/82 | HR 89 | Resp 16 | Ht 68.0 in | Wt 202.0 lb

## 2015-04-12 DIAGNOSIS — I671 Cerebral aneurysm, nonruptured: Secondary | ICD-10-CM | POA: Diagnosis not present

## 2015-04-12 DIAGNOSIS — R51 Headache: Secondary | ICD-10-CM

## 2015-04-12 DIAGNOSIS — R519 Headache, unspecified: Secondary | ICD-10-CM

## 2015-04-12 MED ORDER — DULOXETINE HCL 30 MG PO CPEP: 30.0000 mg | ORAL_CAPSULE | Freq: Every day | ORAL | Status: DC

## 2015-04-12 MED ORDER — NORTRIPTYLINE HCL 25 MG PO CAPS: ORAL_CAPSULE | ORAL | Status: DC

## 2015-04-12 NOTE — Progress Notes (Signed)
NEUROLOGY FOLLOW UP OFFICE NOTE  Melissa Morgan [id number]  HISTORY OF PRESENT ILLNESS: I had the pleasure of seeing Melissa Morgan in follow-up in the neurology clinic on 04/12/2015.  The patient was last seen 2 months ago for worsening headaches since brain surgery in August 2015. Since her last visit, she has started nortriptyline, currently on 50mg  qhs. She is also taking Cymbalta 60mg  daily. She reports that she now has days without migraines, but continues to have daily episodes of a quick pain or jab, or twisting sensation on the right side of her head. She has stopped daily intake of Tylenol and would mostly lie down with the headaches. She is constantly nauseated and feels tired. She has noticed that she is grinding her teeth pretty badly, wearing down her mouth guard at night. She has discussed this with her neurosurgeon, and has been advised to follow-up with her dentist, as she has a history of TMJ disorder. She continues to deal with mood issues and has started seeing a therapist. 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 She shows me a picture of blood that she coughed up recently, she was blowing her nose the night prior and noticed nosebleeding, then woke up the next morning with a bad taste in her mouth. She sneezed one time and felt like something burst in her head and blood spurted out of her nose. It stopped quickly, but she continues to have nausea. She is reporting weight gain with nortriptyline and Cymbalta, and has noticed dry mouth with nortriptyline.  HPI: This is a 49 yo RH woman with a history of right MCA aneurysm s/p clipping, who presented with worsening headaches that she and her daughter report started post-op last August 2015. She reports a history of headaches for  years, similar to her current headaches, but significantly less frequent, occurring 1-2 times a month. At one point during that time, she had a bad headache 1-1/2 years ago and was told she had a lacunar infarct in a hospital in [place]. She was getting injections which were not helping her. In July 2015, she presented to the ER with a day of severe right-sided headache with left hand numbness and tingling, dizziness, palpitations, diaphoresis, shortness of breath. She had a head CT concerning for aneurysm and was transferred to [facility]. I personally reviewed MRI/MRA head done which showed a 9.33mm right MCA bifurcation aneurysm, no acute hemorrhage. She underwent clipping in August 2015, and reports that since then the headaches increased in frequency, occurring on a near-daily basis. Initially she had difficulty opening her jaw, this has improved. She denies any difficulty chewing or swallowing. Pain is over the right fronto-temporal region, radiating to the back of her head, with sharp stabbing pain, and constant pressure. She describes a jabbing knife pain on the left side or a needle poking in her eye. She has a constant 2 over 10 nagging headache, then throughout the day has intermittent knife jabbing going up to 10/10 pain 1-2 times a week where she just goes to bed. These are associated with nausea, photo and phonophobia, similar to her migraines. Headaches can last 2-3 days, throughout the day. She has been taking prn hydrocodone but stated it makes her feel like her head is swelling  up. She takes Tylenol in between the hydrocodone, taking 4-6 tablets almost daily. She takes prn Zofran for nausea.  Her daughter expressed concern that she is "not my mom" any more. She had a DVT after the surgery and is currently on Xarelto. She is tired all the time. She just wants to be alone, and has difficulty dealing with things that are not structured. 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 She has occasional numbness in the median nerve distribution of her left hand. She denies any diplopia but reports blurred vision when headaches worsen. No dysarthria or dysphagia. She has occasional neck pain, no back pain. She has 5-8 hours of unrefreshing sleep, worse since the surgery. Her daughter reports she snores and would wake herself up, but has had a normal sleep study. She was recently started on Cymbalta, and is tapering off Wellbutrin. She reports "terrible anxiety" since the surgery, "I feel like I'm being choked any my head will explode." This would then trigger a headache. This is associated with palpitations, numbness in her hand, which resolves when she sits still and takes Xanax. There is no family history of cerebral aneurysms.  Diagnostic Data: I personally reviewed her 2 previous head CT scans and recent MRI brain showing post-surgical clipping of the right MCA aneurysm, artifact in this region. No acute changes.   PAST MEDICAL HISTORY: Past Medical History  Diagnosis Date  . Depression   . Tachycardia   . Anxiety   . Lacunar infarction   . Endometriosis   . Headache   . PONV (postoperative nausea and vomiting)     extremely sick  . Stroke     cva  14  . GERD (gastroesophageal reflux disease)     occ  . Seizures     tx 5 yrs in middle school  continuous  . Arthritis   . Brain aneurysm   . DVT (deep venous thrombosis)     MEDICATIONS: Current Outpatient Prescriptions on File Prior to Visit  Medication Sig Dispense Refill  . ALPRAZolam (XANAX) 0.5 MG tablet Take 1 tablet (0.5 mg total) by mouth 2 (two) times daily as needed for anxiety. 60 tablet 1  . DULoxetine (CYMBALTA) 60 MG capsule Take 1 capsule (60 mg total) by mouth daily. 30 capsule 3  . metoprolol tartrate (LOPRESSOR) 25 MG tablet Take 25 mg by mouth at bedtime.    . nortriptyline (PAMELOR)  10 MG capsule Take 5 capsules at bedtime 150 capsule 3  . Ondansetron HCl (ZOFRAN PO) Take 4 mg by mouth as needed.     . pantoprazole (PROTONIX) 40 MG tablet Take 1 tablet (40 mg total) by mouth daily. 30 tablet 3  . rivaroxaban (XARELTO) 10 MG TABS tablet Take 0.5 tablets (5 mg total) by mouth daily with supper. 30 tablet 6  . traZODone (DESYREL) 50 MG tablet TAKE 1/2 TO 1 TABLET BY MOUTH AT BEDTIME AS NEEDED FOR SLEEP 30 tablet 3   No current facility-administered medications on file prior to visit.    ALLERGIES: Allergies  Allergen Reactions  . Imitrex [Sumatriptan] Other (See Comments)    Increase heart rate and chest pain   . Dilaudid [Hydromorphone Hcl] Nausea And Vomiting  . Ace Inhibitors Other (See Comments)    REACTION: unknown  . Tramadol Other (See Comments)    Palpitations; Dizziness  . Codeine Nausea  Only  . Latex Itching    FAMILY HISTORY: Family History  Problem Relation Age of Onset  . Hypertension Brother     2 out of 3 brothers  . Heart attack Brother     2 out of 3 brothers  . Diabetes Brother   . Asthma Sister   . Hypertension Mother   . Heart attack Father   . Breast cancer Sister     SOCIAL HISTORY: History   Social History  . Marital Status: Legally Separated    Spouse Name: N/A  . Number of Children: N/A  . Years of Education: N/A   Occupational History  . Not on file.   Social History Main Topics  . Smoking status: Former Smoker -- 0.00 packs/day for 30 years    Start date: 12/28/1983    Quit date: 05/26/2014  . Smokeless tobacco: Never Used     Comment: quit smoking 3 months ago  . Alcohol Use: No     Comment: Occasional  . Drug Use: No  . Sexual Activity: Not on file   Other Topics Concern  . Not on file   Social History Narrative    REVIEW OF SYSTEMS: Constitutional: No fevers, chills, or sweats, + generalized fatigue, change in appetite Eyes: No visual changes, double vision, eye pain Ear, nose and throat: No hearing  loss, ear pain, nasal congestion, sore throat Cardiovascular: No chest pain, palpitations Respiratory:  No shortness of breath at rest or with exertion, wheezes GastrointestinaI: + nausea, vomiting, no diarrhea, abdominal pain, fecal incontinence Genitourinary:  No dysuria, urinary retention or frequency Musculoskeletal:  No neck pain, back pain Integumentary: No rash, pruritus, skin lesions Neurological: as above Psychiatric: + depression, insomnia, anxiety Endocrine: No palpitations, +fatigue,no diaphoresis, +mood swings, change in appetite,+ change in weight, no increased thirst Hematologic/Lymphatic:  No anemia, purpura, petechiae. Allergic/Immunologic: no itchy/runny eyes, nasal congestion, recent allergic reactions, rashes  PHYSICAL EXAM: Filed Vitals:   04/12/15 1514  BP: 122/82  Pulse: 89  Resp: 16   General: No acute distress, less anxious today but again becomes tearful in the office Head:  Normocephalic/atraumatic Neck: supple, no paraspinal tenderness, full range of motion Heart:  Regular rate and rhythm Lungs:  Clear to auscultation bilaterally Back: No paraspinal tenderness Skin/Extremities: No rash, no edema Neurological Exam: alert and oriented to person, place, and time. No aphasia or dysarthria. Fund of knowledge is appropriate.  Recent and remote memory are intact.  Attention and concentration are normal.    Able to name objects and repeat phrases. 3/3 delayed recall. Cranial nerves: Pupils equal, round, reactive to light.  Fundoscopic exam unremarkable, no papilledema. Extraocular movements intact with no nystagmus. Visual fields full. Facial sensation intact. No facial asymmetry. Tongue, uvula, palate midline.  Motor: Bulk and tone normal, muscle strength 5/5 throughout with no pronator drift.  Sensation to light touch intact.  No extinction to double simultaneous stimulation.  Deep tendon reflexes 2+ throughout, toes downgoing.  Finger to nose testing intact.  Gait  narrow-based and steady, able to tandem walk adequately.  Romberg negative.  IMPRESSION: This is a 49 yo RH woman with a history of migraines, right MCA aneurysm s/p clipping, DVT, anxiety, with increased headaches since surgery in August 2015. There are some migrainous features to her headaches, however she also has burning stabbing pain on the right side suggestive of neuralgia. Cymbalta has not helped much, nortriptyline seems to be providing some relief. She will increase nortriptyline to 75mg  qhs and start weaning  down Cymbalta to 30mg  daily. She does have some dry mouth with nortriptyline, continue to monitor for now. She also continues to have significant social issues with her divorce and unemployment, she was advised to discuss social work consult with her PCP, and continue seeing her therapist. She knows to minimize intake of Tylenol and hydrocodone to avoid rebound headaches. She will keep a calendar of her headaches and follow-up in 2 months.   Thank you for allowing me to participate in her care.  Please do not hesitate to call for any questions or concerns.  The duration of this appointment visit was 25 minutes of face-to-face time with the patient.  Greater than 50% of this time was spent in counseling, explanation of diagnosis, planning of further management, and coordination of care.   Melissa Morgan, M.D.   CC: Dr. Birdie Riddle, Dr. Christella Noa

## 2015-04-12 NOTE — Patient Instructions (Signed)
1. Increase nortriptyline: Take 3 capsules of nortriptyline 25mg  dose 2. Reduce Cymbalta to 30mg  daily 3. Keep a calendar of your headaches 4. Follow-up in 2 months

## 2015-04-14 DIAGNOSIS — R519 Headache, unspecified: Secondary | ICD-10-CM | POA: Insufficient documentation

## 2015-04-14 DIAGNOSIS — R51 Headache: Principal | ICD-10-CM

## 2015-04-15 ENCOUNTER — Other Ambulatory Visit: Payer: Self-pay | Admitting: *Deleted

## 2015-04-15 DIAGNOSIS — I82402 Acute embolism and thrombosis of unspecified deep veins of left lower extremity: Secondary | ICD-10-CM

## 2015-04-15 MED ORDER — RIVAROXABAN 15 MG PO TABS: 15.0000 mg | ORAL_TABLET | Freq: Every day | ORAL | Status: DC

## 2015-04-16 ENCOUNTER — Ambulatory Visit (INDEPENDENT_AMBULATORY_CARE_PROVIDER_SITE_OTHER): Payer: BLUE CROSS/BLUE SHIELD | Admitting: Licensed Clinical Social Worker

## 2015-04-16 DIAGNOSIS — F332 Major depressive disorder, recurrent severe without psychotic features: Secondary | ICD-10-CM

## 2015-04-24 ENCOUNTER — Telehealth: Payer: Self-pay | Admitting: Family Medicine

## 2015-04-24 MED ORDER — ALPRAZOLAM 0.5 MG PO TABS: 0.5000 mg | ORAL_TABLET | Freq: Two times a day (BID) | ORAL | Status: DC | PRN

## 2015-04-24 NOTE — Telephone Encounter (Signed)
Ok 60 and 1 RF 

## 2015-04-24 NOTE — Telephone Encounter (Signed)
Med filled and faxed.  

## 2015-04-24 NOTE — Telephone Encounter (Signed)
Relation to pt: self  Call back number: [phone] Pharmacy: [id number] - [place], [place] - [address] AT [address] [phone] (Phone) [phone] (Fax)         Reason for call:  Pt requesting a refill ALPRAZolam (XANAX) 0.5 MG tablet

## 2015-04-24 NOTE — Telephone Encounter (Signed)
Last OV 02-07-15 Alprazolam last filled 02-06-15 #60 with 1  [facility]SC on file, Low Risk, next UDS due in august

## 2015-04-30 ENCOUNTER — Ambulatory Visit (INDEPENDENT_AMBULATORY_CARE_PROVIDER_SITE_OTHER): Payer: BLUE CROSS/BLUE SHIELD | Admitting: Licensed Clinical Social Worker

## 2015-04-30 DIAGNOSIS — F332 Major depressive disorder, recurrent severe without psychotic features: Secondary | ICD-10-CM

## 2015-05-07 ENCOUNTER — Encounter: Payer: Self-pay | Admitting: Family Medicine

## 2015-05-08 ENCOUNTER — Other Ambulatory Visit: Payer: Self-pay | Admitting: General Practice

## 2015-05-08 MED ORDER — ONDANSETRON 4 MG PO TBDP: 4.0000 mg | ORAL_TABLET | Freq: Three times a day (TID) | ORAL | Status: DC | PRN

## 2015-05-14 ENCOUNTER — Telehealth: Payer: Self-pay | Admitting: Family Medicine

## 2015-05-14 ENCOUNTER — Ambulatory Visit (INDEPENDENT_AMBULATORY_CARE_PROVIDER_SITE_OTHER): Payer: BLUE CROSS/BLUE SHIELD | Admitting: Licensed Clinical Social Worker

## 2015-05-14 DIAGNOSIS — F332 Major depressive disorder, recurrent severe without psychotic features: Secondary | ICD-10-CM

## 2015-05-14 NOTE — Telephone Encounter (Signed)
Noted, thanks!

## 2015-05-14 NOTE — Telephone Encounter (Signed)
Called patient. Msg from after hours line that patient called yesterday(7/4) afternoon. That she was having a headache with severe pain behind right eye. She was advised to go the ED then.    She didn't go to the ED. She states the headache is still there, but not as severe as it was. She is still taking the nortriptyline. She did take a Oxycodone last night to help her sleep. Advised her to call if sxs change or worsen.

## 2015-05-22 ENCOUNTER — Telehealth: Payer: Self-pay | Admitting: Family Medicine

## 2015-05-22 ENCOUNTER — Emergency Department (HOSPITAL_BASED_OUTPATIENT_CLINIC_OR_DEPARTMENT_OTHER)
Admission: EM | Admit: 2015-05-22 | Discharge: 2015-05-23 | Disposition: A | Discharge status: Home / Self Care | Payer: BLUE CROSS/BLUE SHIELD | Attending: Emergency Medicine | Admitting: Emergency Medicine

## 2015-05-22 ENCOUNTER — Encounter (HOSPITAL_BASED_OUTPATIENT_CLINIC_OR_DEPARTMENT_OTHER): Payer: Self-pay

## 2015-05-22 ENCOUNTER — Emergency Department (HOSPITAL_BASED_OUTPATIENT_CLINIC_OR_DEPARTMENT_OTHER): Payer: BLUE CROSS/BLUE SHIELD

## 2015-05-22 DIAGNOSIS — K219 Gastro-esophageal reflux disease without esophagitis: Secondary | ICD-10-CM | POA: Diagnosis not present

## 2015-05-22 DIAGNOSIS — Z86718 Personal history of other venous thrombosis and embolism: Secondary | ICD-10-CM | POA: Insufficient documentation

## 2015-05-22 DIAGNOSIS — Z8739 Personal history of other diseases of the musculoskeletal system and connective tissue: Secondary | ICD-10-CM | POA: Insufficient documentation

## 2015-05-22 DIAGNOSIS — Z87448 Personal history of other diseases of urinary system: Secondary | ICD-10-CM | POA: Insufficient documentation

## 2015-05-22 DIAGNOSIS — F329 Major depressive disorder, single episode, unspecified: Secondary | ICD-10-CM | POA: Insufficient documentation

## 2015-05-22 DIAGNOSIS — Z8679 Personal history of other diseases of the circulatory system: Secondary | ICD-10-CM | POA: Diagnosis not present

## 2015-05-22 DIAGNOSIS — Z91040 Latex allergy status: Secondary | ICD-10-CM | POA: Insufficient documentation

## 2015-05-22 DIAGNOSIS — Z8673 Personal history of transient ischemic attack (TIA), and cerebral infarction without residual deficits: Secondary | ICD-10-CM | POA: Insufficient documentation

## 2015-05-22 DIAGNOSIS — F419 Anxiety disorder, unspecified: Secondary | ICD-10-CM | POA: Insufficient documentation

## 2015-05-22 DIAGNOSIS — Z87891 Personal history of nicotine dependence: Secondary | ICD-10-CM | POA: Diagnosis not present

## 2015-05-22 DIAGNOSIS — Z7901 Long term (current) use of anticoagulants: Secondary | ICD-10-CM | POA: Insufficient documentation

## 2015-05-22 DIAGNOSIS — R51 Headache: Secondary | ICD-10-CM | POA: Diagnosis present

## 2015-05-22 DIAGNOSIS — Z79899 Other long term (current) drug therapy: Secondary | ICD-10-CM | POA: Diagnosis not present

## 2015-05-22 DIAGNOSIS — G4489 Other headache syndrome: Secondary | ICD-10-CM | POA: Insufficient documentation

## 2015-05-22 LAB — CBC WITH DIFFERENTIAL/PLATELET
Basophils Absolute: 0 10*3/uL (ref 0.0–0.1)
Basophils Relative: 1 % (ref 0–1)
EOS PCT: 3 % (ref 0–5)
Eosinophils Absolute: 0.2 10*3/uL (ref 0.0–0.7)
HCT: 39.1 % (ref 36.0–46.0)
Hemoglobin: 13.1 g/dL (ref 12.0–15.0)
LYMPHS PCT: 50 % — AB (ref 12–46)
Lymphs Abs: 3.1 10*3/uL (ref 0.7–4.0)
MCH: 30 pg (ref 26.0–34.0)
MCHC: 33.5 g/dL (ref 30.0–36.0)
MCV: 89.5 fL (ref 78.0–100.0)
Monocytes Absolute: 0.7 10*3/uL (ref 0.1–1.0)
Monocytes Relative: 12 % (ref 3–12)
Neutro Abs: 2.3 10*3/uL (ref 1.7–7.7)
Neutrophils Relative %: 36 % — ABNORMAL LOW (ref 43–77)
Platelets: 247 10*3/uL (ref 150–400)
RBC: 4.37 MIL/uL (ref 3.87–5.11)
RDW: 13.6 % (ref 11.5–15.5)
WBC: 6.3 10*3/uL (ref 4.0–10.5)

## 2015-05-22 LAB — BASIC METABOLIC PANEL
Anion gap: 9 (ref 5–15)
BUN: 12 mg/dL (ref 6–20)
CHLORIDE: 102 mmol/L (ref 101–111)
CO2: 27 mmol/L (ref 22–32)
Calcium: 9.4 mg/dL (ref 8.9–10.3)
Creatinine, Ser: 0.83 mg/dL (ref 0.44–1.00)
GFR calc Af Amer: >60 mL/min (ref >60)
GFR calc non Af Amer: >60 mL/min (ref >60)
GLUCOSE: 108 mg/dL — AB (ref 65–99)
POTASSIUM: 3.8 mmol/L (ref 3.5–5.1)
Sodium: 138 mmol/L (ref 135–145)

## 2015-05-22 MED ORDER — PROMETHAZINE HCL 25 MG/ML IJ SOLN
25.0000 mg | Freq: Once | INTRAMUSCULAR | Status: AC
  Administered 2015-05-22: 25 mg via INTRAVENOUS
  Filled 2015-05-22: qty 1

## 2015-05-22 NOTE — ED Notes (Signed)
HA started yesterday-nausea-pt states "I'm not a drug seeker. I don't want any narcotics"

## 2015-05-22 NOTE — Discharge Instructions (Signed)
Continue tylenol for headaches and zofran for nausea.   See your neurologist for headache management.   Return to ER if you have worse headaches, vomiting, weakness.

## 2015-05-22 NOTE — ED Provider Notes (Signed)
CSN: [id number]     Arrival date & time 05/22/15  2158 History  This chart was scribed for Wandra Arthurs, MD by Steva Colder, ED Scribe. The patient was seen in room MH06/MH06 at 10:17 PM.       Chief Complaint  Patient presents with  . Headache      The history is provided by the patient. No language interpreter was used.    Melissa Morgan is a 49 y.o. female with a medical hx of HA who presents to the Emergency Department complaining of constant HA onset yesterday. Pt notes that she has had a frontal craniotomy for cerebral aneurysm repair. She is currently on xarelto for DVT. She states that she has chronic headaches and reports that she typically gets a cocktail while in the ED for her HA. Pt reports that nothing works for her HA. Pt is having associated symptoms of nausea, weakness, tingling in left 3rd-5th digit. She notes that she has tried zofran and 2 extra strength tylenol with no relief of her symptoms. She denies any other symptoms.     Past Medical History  Diagnosis Date  . Depression   . Tachycardia   . Anxiety   . Lacunar infarction   . Endometriosis   . Headache   . PONV (postoperative nausea and vomiting)     extremely sick  . Stroke     cva  14  . GERD (gastroesophageal reflux disease)     occ  . Seizures     tx 5 yrs in middle school  continuous  . Arthritis   . Brain aneurysm   . DVT (deep venous thrombosis)    Past Surgical History  Procedure Laterality Date  . Lasar laparoscopy      for endometriosis x3  . Craniotomy Right 07/06/2014    Procedure: CRANIOTOMY INTRACRANIAL ANEURYSM FOR CAROTID;  Surgeon: Ashok Pall, MD;  Location: [facility];  Service: Neurosurgery;  Laterality: Right;  right    Family History  Problem Relation Age of Onset  . Hypertension Brother     2 out of 3 brothers  . Heart attack Brother     2 out of 3 brothers  . Diabetes Brother   . Asthma Sister   . Hypertension Mother   . Heart attack Father   . Breast cancer  Sister    History  Substance Use Topics  . Smoking status: Former Smoker -- 0.00 packs/day for 30 years    Start date: 12/28/1983    Quit date: 05/26/2014  . Smokeless tobacco: Never Used     Comment: quit smoking 3 months ago  . Alcohol Use: No   OB History    No data available     Review of Systems  Gastrointestinal: Positive for nausea.  Neurological: Positive for weakness, numbness and headaches.  All other systems reviewed and are negative.     Allergies  Imitrex; Dilaudid; Ace inhibitors; Tramadol; Codeine; and Latex  Home Medications   Prior to Admission medications   Medication Sig Start Date End Date Taking? Authorizing Provider  Pantoprazole Sodium (PROTONIX PO) Take by mouth.   Yes Historical Provider, MD  ALPRAZolam [name]) 0.5 MG tablet Take 1 tablet (0.5 mg total) by mouth 2 (two) times daily as needed for anxiety. 04/24/15   Colon Branch, MD  DULoxetine (CYMBALTA) 30 MG capsule Take 1 capsule (30 mg total) by mouth daily. 04/12/15   Cameron Sprang, MD  metoprolol tartrate (LOPRESSOR) 25 MG tablet  Take 25 mg by mouth at bedtime.    Historical Provider, MD  nortriptyline (PAMELOR) 25 MG capsule Take 3 capsules at bedtime 04/12/15   Cameron Sprang, MD  ondansetron (ZOFRAN-ODT) 4 MG disintegrating tablet Take 1 tablet (4 mg total) by mouth every 8 (eight) hours as needed for nausea or vomiting. 05/08/15   Midge Minium, MD  rivaroxaban (XARELTO) 15 MG TABS tablet Take 1 tablet (15 mg total) by mouth daily with supper. 04/15/15   Volanda Napoleon, MD  traZODone (DESYREL) 50 MG tablet TAKE 1/2 TO 1 TABLET BY MOUTH AT BEDTIME AS NEEDED FOR SLEEP 01/30/15   Midge Minium, MD   BP 125/83 mmHg  Pulse 98  Temp(Src) 97.9 F (36.6 C) (Oral)  Resp 20  Ht 5\' 7"  (1.702 m)  Wt 205 lb (92.987 kg)  BMI 32.10 kg/m2  SpO2 100%  LMP 12/28/2012 Physical Exam  Constitutional: She is oriented to person, place, and time. She appears well-developed and well-nourished.  HENT:   Head: Normocephalic.  Mouth/Throat: Oropharynx is clear and moist.  Eyes: EOM are normal. Pupils are equal, round, and reactive to light.  Neck: Normal range of motion. Neck supple.  Cardiovascular: Normal rate, regular rhythm and normal heart sounds.   Pulmonary/Chest: Effort normal and breath sounds normal. No respiratory distress. She has no wheezes. She has no rales.  Abdominal: Soft. Bowel sounds are normal. She exhibits no distension. There is no tenderness. There is no rebound.  Musculoskeletal: Normal range of motion.  Neurological: She is alert and oriented to person, place, and time. She has normal strength. No sensory deficit.  Slight decreased sensation in left first second and third finger.   Skin: Skin is warm.  Psychiatric: She has a normal mood and affect. Her behavior is normal. Judgment and thought content normal.  Nursing note and vitals reviewed.   ED Course  Procedures (including critical care time) DIAGNOSTIC STUDIES: Oxygen Saturation is 100% on RA, nl by my interpretation.    COORDINATION OF CARE: 10:23 PM-Discussed treatment plan with pt at bedside and pt agreed to plan.   Labs Review Labs Reviewed  CBC WITH DIFFERENTIAL/PLATELET - Abnormal; Notable for the following:    Neutrophils Relative % 36 (*)    Lymphocytes Relative 50 (*)    All other components within normal limits  BASIC METABOLIC PANEL - Abnormal; Notable for the following:    Glucose, Bld 108 (*)    All other components within normal limits    Imaging Review Ct Head Wo Contrast  05/22/2015   CLINICAL DATA:  Severe headaches for 1 day. Nausea. Left hand tingling in weakness. History of right craniotomy, stroke, and aneurysm clipping.  EXAM: CT HEAD WITHOUT CONTRAST  TECHNIQUE: Contiguous axial images were obtained from the base of the skull through the vertex without intravenous contrast.  COMPARISON:  MRI brain 01/15/2015.  CT head 09/12/2014  FINDINGS: Postoperative changes with prior right  frontotemporal craniotomy and aneurysm clipping in the middle cranial fossa, likely of a MCA aneurysm. Streak artifact from clips obscures visualization in some areas. No evidence of any recurrent intracranial hemorrhage. Ventricles and sulci appear otherwise symmetrical. No abnormal extra-axial fluid collections. No ventricular dilatation. Gray-white matter junctions are distinct. Basal cisterns are not effaced. No acute intracranial hemorrhage. Visualized paranasal sinuses and mastoid air cells are not opacified.  IMPRESSION: Postoperative changes consistent with right MCA aneurysm clipping. No acute intracranial abnormalities. No evidence of acute intracranial hemorrhage.   Electronically Signed  By: Lucienne Capers M.D.   On: 05/22/2015 23:32     EKG Interpretation None      MDM   Final diagnoses:  None    Melissa Morgan is a 50 y.o. female here with headache. Given hx of cerebral aneurysm s/p clipping. Will get CT head to r/o bleed but if CT showed no bleed, I doubt SAH and will not need LP. Does have chronic headaches. Will give phenergan as she has multiple allergies.  11:47 PM CT showed no bleed. Labs stable. Headache down from 8/10 to 6/10. Doesn't want anything else for headaches. Has been seeing neurologist for headache already. Likely worsening of chronic headaches. Will dc home.    I personally performed the services described in this documentation, which was scribed in my presence. The recorded information has been reviewed and is accurate.    Wandra Arthurs, MD 05/22/15 [phone]

## 2015-05-22 NOTE — Telephone Encounter (Signed)
Agree, pls let her know at this point we have tried several medications and she is still in pain. She would benefit from seeing a Pain Specialist, pls refer to Pain Mgt. Thanks!

## 2015-05-22 NOTE — Telephone Encounter (Signed)
Patient states for the past few weeks she's had "severe" head pain on & off. She states she is "extremely" nauseous. States that nortriptyline isn't helping with her pain. She states that regular Tylenol helps minimally but the pain comes back. Any advisement or do you want to refer her to pain mgmnt or headache & wellness center?

## 2015-05-23 ENCOUNTER — Encounter: Payer: Self-pay | Admitting: Neurology

## 2015-05-24 NOTE — Telephone Encounter (Signed)
I called patient again after receiving an email from her to let her know what Dr. Amparo Bristol advisement was for her. She states that she hadn't heard anything. She was questioning wether or not Dr. Delice Lesch actually gets her messages. I explained to her that any time she calls it is documented and Dr. Delice Lesch is notified, also any advisement or recommendations that she has been given does come from Dr. Delice Lesch. I explained to her that I wasn't in the office yesterday and that was the reason for my call this morning. She proceeds to say that she called the office a few weeks ago and she didn't get a return call. I pulled up the message and she called the after hours line on 7/4 when the office was closed, I called her on 7/5 when we were back in the office after receiving fax notification that she had called and what her issues were. She explained what was going on and what medicines she had taken and told me she was feeling better. I advised for her to call us back if any changes occurred or if sxs returned she said she would do that. Now she is denying that conversation took place. I explained to her that the telephone call was documented and also that Dr. Delice Lesch was made aware of this. Then she proceeds to tell me that she has documentation too and that she is going to report me and hung up the phone in my face.

## 2015-05-24 NOTE — Telephone Encounter (Signed)
Lmovm to return my call. 

## 2015-05-28 ENCOUNTER — Ambulatory Visit: Payer: BLUE CROSS/BLUE SHIELD | Admitting: Licensed Clinical Social Worker

## 2015-06-04 ENCOUNTER — Encounter: Payer: Self-pay | Admitting: Family Medicine

## 2015-06-10 ENCOUNTER — Telehealth: Payer: Self-pay | Admitting: *Deleted

## 2015-06-10 ENCOUNTER — Encounter: Payer: Self-pay | Admitting: Neurology

## 2015-06-10 NOTE — Telephone Encounter (Signed)
I tried to contact patient at [phone] left a message for patient to return my call

## 2015-06-12 ENCOUNTER — Other Ambulatory Visit: Payer: Self-pay | Admitting: General Practice

## 2015-06-12 MED ORDER — METOPROLOL TARTRATE 25 MG PO TABS: 25.0000 mg | ORAL_TABLET | Freq: Every day | ORAL | Status: DC

## 2015-06-13 ENCOUNTER — Encounter: Payer: Self-pay | Admitting: Family Medicine

## 2015-06-14 ENCOUNTER — Telehealth: Payer: Self-pay | Admitting: Family Medicine

## 2015-06-14 MED ORDER — METOPROLOL SUCCINATE ER 25 MG PO TB24: 25.0000 mg | ORAL_TABLET | Freq: Every day | ORAL | Status: DC

## 2015-06-14 NOTE — Telephone Encounter (Signed)
Ok to switch to Metoprolol ER.  Please send script

## 2015-06-14 NOTE — Telephone Encounter (Signed)
Please advise 

## 2015-06-14 NOTE — Telephone Encounter (Signed)
Caller name: Justin Mend  Relation to pt: [place]  Call back number: [phone]    Reason for call:  As per pharmacy patient has been taking metoprolol er succinate 25 mg but metoprolol tartrate (LOPRESSOR) 25 MG tablet was sent over instead and as per pharmacy patient said she does not like the side effects. Pharmacy requesting metoprolol tartrate (LOPRESSOR) 25 MG tablet

## 2015-06-14 NOTE — Telephone Encounter (Signed)
This medication was filled.  

## 2015-06-14 NOTE — Telephone Encounter (Signed)
Please advise, i refilled the medication for metoprolol that was on pt med list. She only has had the one Rx ever written and metoprolol succinate is a extended release medication. Should pt be on this?

## 2015-06-17 ENCOUNTER — Ambulatory Visit: Payer: Self-pay | Admitting: Neurology

## 2015-06-18 ENCOUNTER — Emergency Department (HOSPITAL_BASED_OUTPATIENT_CLINIC_OR_DEPARTMENT_OTHER): Payer: BLUE CROSS/BLUE SHIELD

## 2015-06-18 ENCOUNTER — Telehealth: Payer: Self-pay | Admitting: Family Medicine

## 2015-06-18 ENCOUNTER — Emergency Department (HOSPITAL_BASED_OUTPATIENT_CLINIC_OR_DEPARTMENT_OTHER)
Admission: EM | Admit: 2015-06-18 | Discharge: 2015-06-18 | Disposition: A | Discharge status: Home / Self Care | Payer: BLUE CROSS/BLUE SHIELD | Attending: Emergency Medicine | Admitting: Emergency Medicine

## 2015-06-18 ENCOUNTER — Encounter (HOSPITAL_BASED_OUTPATIENT_CLINIC_OR_DEPARTMENT_OTHER): Payer: Self-pay

## 2015-06-18 DIAGNOSIS — Z87891 Personal history of nicotine dependence: Secondary | ICD-10-CM | POA: Diagnosis not present

## 2015-06-18 DIAGNOSIS — Z8673 Personal history of transient ischemic attack (TIA), and cerebral infarction without residual deficits: Secondary | ICD-10-CM | POA: Diagnosis not present

## 2015-06-18 DIAGNOSIS — R918 Other nonspecific abnormal finding of lung field: Secondary | ICD-10-CM

## 2015-06-18 DIAGNOSIS — Z86718 Personal history of other venous thrombosis and embolism: Secondary | ICD-10-CM | POA: Diagnosis not present

## 2015-06-18 DIAGNOSIS — R000 Tachycardia, unspecified: Secondary | ICD-10-CM | POA: Diagnosis not present

## 2015-06-18 DIAGNOSIS — K219 Gastro-esophageal reflux disease without esophagitis: Secondary | ICD-10-CM | POA: Insufficient documentation

## 2015-06-18 DIAGNOSIS — R0602 Shortness of breath: Secondary | ICD-10-CM | POA: Diagnosis not present

## 2015-06-18 DIAGNOSIS — Z91040 Latex allergy status: Secondary | ICD-10-CM | POA: Insufficient documentation

## 2015-06-18 DIAGNOSIS — Z8679 Personal history of other diseases of the circulatory system: Secondary | ICD-10-CM | POA: Insufficient documentation

## 2015-06-18 DIAGNOSIS — R079 Chest pain, unspecified: Secondary | ICD-10-CM | POA: Diagnosis not present

## 2015-06-18 DIAGNOSIS — F329 Major depressive disorder, single episode, unspecified: Secondary | ICD-10-CM | POA: Diagnosis not present

## 2015-06-18 DIAGNOSIS — Z8739 Personal history of other diseases of the musculoskeletal system and connective tissue: Secondary | ICD-10-CM | POA: Insufficient documentation

## 2015-06-18 DIAGNOSIS — Z79899 Other long term (current) drug therapy: Secondary | ICD-10-CM | POA: Insufficient documentation

## 2015-06-18 DIAGNOSIS — F419 Anxiety disorder, unspecified: Secondary | ICD-10-CM | POA: Insufficient documentation

## 2015-06-18 DIAGNOSIS — Z8742 Personal history of other diseases of the female genital tract: Secondary | ICD-10-CM | POA: Diagnosis not present

## 2015-06-18 LAB — CBC WITH DIFFERENTIAL/PLATELET
Basophils Absolute: 0 K/uL (ref 0.0–0.1)
Basophils Relative: 1 % (ref 0–1)
Eosinophils Absolute: 0.2 K/uL (ref 0.0–0.7)
Eosinophils Relative: 2 % (ref 0–5)
HCT: 40.9 % (ref 36.0–46.0)
Hemoglobin: 13.7 g/dL (ref 12.0–15.0)
Lymphocytes Relative: 44 % (ref 12–46)
Lymphs Abs: 3.6 K/uL (ref 0.7–4.0)
MCH: 29.8 pg (ref 26.0–34.0)
MCHC: 33.5 g/dL (ref 30.0–36.0)
MCV: 89.1 fL (ref 78.0–100.0)
Monocytes Absolute: 0.7 K/uL (ref 0.1–1.0)
Monocytes Relative: 8 % (ref 3–12)
Neutro Abs: 3.7 K/uL (ref 1.7–7.7)
Neutrophils Relative %: 45 % (ref 43–77)
Platelets: 243 K/uL (ref 150–400)
RBC: 4.59 MIL/uL (ref 3.87–5.11)
RDW: 13.4 % (ref 11.5–15.5)
WBC: 8.1 K/uL (ref 4.0–10.5)

## 2015-06-18 LAB — COMPREHENSIVE METABOLIC PANEL
ALT: 38 U/L (ref 14–54)
ANION GAP: 11 (ref 5–15)
AST: 42 U/L — ABNORMAL HIGH (ref 15–41)
Albumin: 4.5 g/dL (ref 3.5–5.0)
Alkaline Phosphatase: 83 U/L (ref 38–126)
BILIRUBIN TOTAL: 0.6 mg/dL (ref 0.3–1.2)
BUN: 13 mg/dL (ref 6–20)
CALCIUM: 9.5 mg/dL (ref 8.9–10.3)
CO2: 24 mmol/L (ref 22–32)
CREATININE: 0.9 mg/dL (ref 0.44–1.00)
Chloride: 102 mmol/L (ref 101–111)
GFR calc Af Amer: >60 mL/min (ref >60)
GFR calc non Af Amer: >60 mL/min (ref >60)
GLUCOSE: 140 mg/dL — AB (ref 65–99)
Potassium: 3.6 mmol/L (ref 3.5–5.1)
Sodium: 137 mmol/L (ref 135–145)
Total Protein: 7.7 g/dL (ref 6.5–8.1)

## 2015-06-18 LAB — TROPONIN I: Troponin I: <0.03 ng/mL (ref <0.031)

## 2015-06-18 MED ORDER — MORPHINE SULFATE 4 MG/ML IJ SOLN
4.0000 mg | Freq: Once | INTRAMUSCULAR | Status: DC
  Filled 2015-06-18: qty 1

## 2015-06-18 MED ORDER — SODIUM CHLORIDE 0.9 % IV BOLUS (SEPSIS)
1000.0000 mL | Freq: Once | INTRAVENOUS | Status: AC
  Administered 2015-06-18: 1000 mL via INTRAVENOUS

## 2015-06-18 MED ORDER — IOHEXOL 350 MG/ML SOLN
100.0000 mL | Freq: Once | INTRAVENOUS | Status: AC | PRN
  Administered 2015-06-18: 100 mL via INTRAVENOUS

## 2015-06-18 MED ORDER — METHOCARBAMOL 500 MG PO TABS: 500.0000 mg | ORAL_TABLET | Freq: Three times a day (TID) | ORAL | Status: DC | PRN

## 2015-06-18 MED ORDER — PROMETHAZINE HCL 25 MG/ML IJ SOLN
25.0000 mg | Freq: Once | INTRAMUSCULAR | Status: AC
  Administered 2015-06-18: 25 mg via INTRAVENOUS
  Filled 2015-06-18: qty 1

## 2015-06-18 NOTE — ED Notes (Signed)
Pt reports SHOB. Also sts pain with inspiration since Saturday. Reports some left sided chest pain as well.

## 2015-06-18 NOTE — ED Notes (Signed)
Placed on cardiac bp and pulse ox monitoring.

## 2015-06-18 NOTE — ED Notes (Signed)
Patient transported to X-ray and CT via stretcher per tech. 

## 2015-06-18 NOTE — ED Notes (Signed)
MD at bedside to discuss results of testing. 

## 2015-06-18 NOTE — ED Provider Notes (Signed)
CSN: [id number]     Arrival date & time 06/18/15  1528 History   First MD initiated Contact with Patient 06/18/15 1541     Chief Complaint  Patient presents with  . Shortness of Breath  . Chest Pain     (Consider location/radiation/quality/duration/timing/severity/associated sxs/prior Treatment) The history is provided by the patient.  Melissa Morgan is a 49 y.o. female hx of depression, anxiety, DVT on xarelto, lacunar infarct here with shortness of breath. Patient recently started a new job at [employer]. She has shortness of breath worse with inspiration for the last 4 days. She fell several days ago and landed on right side of chest. Also some left sided chest pain as well. Denies fever or cough. Still taking xarelto for DVT, no hx of PE. Denies headaches, recently seen for headaches and had nl CT head.    Past Medical History  Diagnosis Date  . Depression   . Tachycardia   . Anxiety   . Lacunar infarction   . Endometriosis   . Headache   . PONV (postoperative nausea and vomiting)     extremely sick  . Stroke     cva  14  . GERD (gastroesophageal reflux disease)     occ  . Seizures     tx 5 yrs in middle school  continuous  . Arthritis   . Brain aneurysm   . DVT (deep venous thrombosis)    Past Surgical History  Procedure Laterality Date  . Lasar laparoscopy      for endometriosis x3  . Craniotomy Right 07/06/2014    Procedure: CRANIOTOMY INTRACRANIAL ANEURYSM FOR CAROTID;  Surgeon: Ashok Pall, MD;  Location: [place] [facility];  Service: Neurosurgery;  Laterality: Right;  right    Family History  Problem Relation Age of Onset  . Hypertension Brother     2 out of 3 brothers  . Heart attack Brother     2 out of 3 brothers  . Diabetes Brother   . Asthma Sister   . Hypertension Mother   . Heart attack Father   . Breast cancer Sister    History  Substance Use Topics  . Smoking status: Former Smoker -- 0.00 packs/day for 30 years    Start date: 12/28/1983    Quit date:  05/26/2014  . Smokeless tobacco: Never Used     Comment: quit smoking 3 months ago  . Alcohol Use: No   OB History    No data available     Review of Systems  Respiratory: Positive for shortness of breath.   Cardiovascular: Positive for chest pain.  All other systems reviewed and are negative.     Allergies  Imitrex; Dilaudid; Ace inhibitors; Tramadol; Codeine; and Latex  Home Medications   Prior to Admission medications   Medication Sig Start Date End Date Taking? Authorizing Provider  ALPRAZolam [name]) 0.5 MG tablet Take 1 tablet (0.5 mg total) by mouth 2 (two) times daily as needed for anxiety. 04/24/15   Colon Branch, MD  DULoxetine (CYMBALTA) 30 MG capsule Take 1 capsule (30 mg total) by mouth daily. 04/12/15   Cameron Sprang, MD  metoprolol succinate (TOPROL-XL) 25 MG 24 hr tablet Take 1 tablet (25 mg total) by mouth daily. 06/14/15   Midge Minium, MD  metoprolol tartrate (LOPRESSOR) 25 MG tablet Take 1 tablet (25 mg total) by mouth at bedtime. 06/12/15   Midge Minium, MD  nortriptyline (PAMELOR) 25 MG capsule Take 3 capsules at bedtime 04/12/15  Cameron Sprang, MD  ondansetron (ZOFRAN-ODT) 4 MG disintegrating tablet Take 1 tablet (4 mg total) by mouth every 8 (eight) hours as needed for nausea or vomiting. 05/08/15   Midge Minium, MD  Pantoprazole Sodium (PROTONIX PO) Take by mouth.    Historical Provider, MD  rivaroxaban (XARELTO) 15 MG TABS tablet Take 1 tablet (15 mg total) by mouth daily with supper. 04/15/15   Volanda Napoleon, MD  traZODone (DESYREL) 50 MG tablet TAKE 1/2 TO 1 TABLET BY MOUTH AT BEDTIME AS NEEDED FOR SLEEP 01/30/15   Midge Minium, MD   BP 136/80 mmHg  Pulse 102  Temp(Src) 98 F (36.7 C) (Oral)  Resp 16  Ht 5\' 7"  (1.702 m)  Wt 205 lb (92.987 kg)  BMI 32.10 kg/m2  SpO2 100%  LMP 12/28/2012 Physical Exam  Constitutional: She is oriented to person, place, and time. She appears well-developed and well-nourished.  Slightly anxious    HENT:  Head: Normocephalic.  Mouth/Throat: Oropharynx is clear and moist.  Eyes: Conjunctivae are normal. Pupils are equal, round, and reactive to light.  Neck: Normal range of motion. Neck supple.  Cardiovascular: Regular rhythm and normal heart sounds.   Slightly tachy   Pulmonary/Chest: Effort normal and breath sounds normal. No respiratory distress. She has no wheezes. She has no rales.  ? Reproducible tenderness L chest. No ecchymosis or signs of injury of chest or abdomen  Abdominal: Soft. Bowel sounds are normal. She exhibits no distension. There is no tenderness. There is no rebound.  Musculoskeletal: Normal range of motion. She exhibits no edema or tenderness.  Neurological: She is alert and oriented to person, place, and time. No cranial nerve deficit. Coordination normal.  Skin: Skin is warm and dry.  Psychiatric: She has a normal mood and affect. Her behavior is normal. Judgment and thought content normal.  Nursing note and vitals reviewed.   ED Course  Procedures (including critical care time) Labs Review Labs Reviewed  COMPREHENSIVE METABOLIC PANEL - Abnormal; Notable for the following:    Glucose, Bld 140 (*)    AST 42 (*)    All other components within normal limits  CBC WITH DIFFERENTIAL/PLATELET  TROPONIN I    Imaging Review Ct Angio Chest Pe W/cm &/or Wo Cm  06/18/2015   CLINICAL DATA:  Short of breath  EXAM: CT ANGIOGRAPHY CHEST WITH CONTRAST  TECHNIQUE: Multidetector CT imaging of the chest was performed using the standard protocol during bolus administration of intravenous contrast. Multiplanar CT image reconstructions and MIPs were obtained to evaluate the vascular anatomy.  CONTRAST:  151mL OMNIPAQUE IOHEXOL 350 MG/ML SOLN  COMPARISON:  Chest x-ray 07/06/2014  FINDINGS: Negative for pulmonary embolism. Negative for aortic aneurysm or dissection. Heart size normal. No pericardial effusion.  Negative for mass or adenopathy.  Multiple prominent nodules are  present.  3 mm nodule along the fissure axial image 55.  4 mm nodule along the fissure right lower lobe image number 59  10 mm subpleural nodule rib right middle lobe axial image 59  5 mm subpleural nodule right lower lobe image 68  10 mm lesion along the major fissure image 42  10 mm calcified nodule left lower lobe laterally with adjacent soft tissue nodule measuring 10 mm. Calcified left hilar nodules consistent with granulomatous disease.  Negative for pleural effusion.  Negative for pneumonia.  Upper abdomen reveals fatty infiltration of liver.  No mass lesion.  Review of the MIP images confirms the above findings.  IMPRESSION: Negative  for pulmonary embolism  Multiple bilateral pulmonary nodules are likely benign. There is evidence of prior granulomatous disease with calcified left lower lobe nodule and left hilar calcified node. If the patient is at high risk for bronchogenic carcinoma, follow-up chest CT at 3-16months is recommended. If the patient is at low risk for bronchogenic carcinoma, follow-up chest CT at 6-12 months is recommended. This recommendation follows the consensus statement: Guidelines for Management of Small Pulmonary Nodules Detected on CT Scans: A Statement from the [employer] [facility] as published in Radiology 2005; 237:395-400.   Electronically Signed   By: Franchot Gallo M.D.   On: 06/18/2015 16:30     EKG Interpretation   Date/Time:  Tuesday June 18 2015 15:42:49 EDT Ventricular Rate:  99 PR Interval:  146 QRS Duration: 86 QT Interval:  356 QTC Calculation: 456 R Axis:   57 Text Interpretation:  Normal sinus rhythm Nonspecific ST abnormality  Abnormal ECG No significant change since last tracing Confirmed by Deniel Mcquiston   MD, Natalina Wieting ([id number]) on 06/18/2015 4:03:06 PM      MDM   Final diagnoses:  Shortness of breath    Melissa Morgan is a 49 y.o. female here with shortness of breath, pleuritic chest pain. Slightly tachy, hx of DVT. Will get labs, CT angio to r/o PE. Consider  ACS but symptoms for several days so trop x 1 sufficient.   5:35 PM Labs at baseline. CT showed no PE, has pulmonary nodules that can be followed up outpatient. Likely muscle strain vs anxiety. Updated patient. Stable for dc. Will dc with robaxin for muscle strain.    Wandra Arthurs, MD 06/18/15 1739

## 2015-06-18 NOTE — Discharge Instructions (Signed)
Take tylenol for pain.   Take robaxin for muscle strain.  See your doctor to follow up pulmonary nodules.   Return to ER if you have severe chest pain, shortness of breath, abdominal pain.

## 2015-06-18 NOTE — ED Notes (Signed)
Pt refused pain med at this time d/t she does not have a ride. Will let RN know if she changes her mind.

## 2015-06-18 NOTE — Telephone Encounter (Signed)
Patient presented to [facility] ED.

## 2015-06-18 NOTE — Telephone Encounter (Signed)
Patient Name: Melissa Morgan  DOB: 1966/08/31    Initial Comment Caller States she is having trouble breathing and pain under her left breast. can be like a stabbing pain when she exhales. was put on new med about a week ago.    Nurse Assessment  Nurse: Thad Ranger RN, Denise Date/Time (Eastern Time): 06/18/2015 3:06:47 PM  Confirm and document reason for call. If symptomatic, describe symptoms. ---Caller states she is having trouble breathing and pain under her left breast. States the pain is a sharp/stabbing pain when she exhales. States she is slightly SOB.  Has the patient traveled out of the country within the last 30 days? ---Not Applicable  Does the patient require triage? ---Yes  Related visit to physician within the last 2 weeks? ---No  Does the PT have any chronic conditions? (i.e. diabetes, asthma, etc.) ---Yes  List chronic conditions. ---Craniotomy for aneurysm removal.  Did the patient indicate they were pregnant? ---No     Guidelines    Guideline Title Affirmed Question Affirmed Notes  Breathing Difficulty History of prior "blood clot" in leg or lungs (i.e., deep vein thrombosis, pulmonary embolism) Takes Xarelto for DVT RLE   Final Disposition User   Go to ED Now Thad Ranger, RN, [facility] - ED   Disagree/Comply: Comply

## 2015-06-18 NOTE — ED Notes (Signed)
MD at bedside. 

## 2015-06-20 ENCOUNTER — Encounter: Payer: Self-pay | Admitting: *Deleted

## 2015-06-20 NOTE — Progress Notes (Signed)
No show letter sent for 06/17/2015

## 2015-06-23 ENCOUNTER — Encounter: Payer: Self-pay | Admitting: Family Medicine

## 2015-06-24 ENCOUNTER — Other Ambulatory Visit: Payer: Self-pay | Admitting: Family Medicine

## 2015-06-24 DIAGNOSIS — R918 Other nonspecific abnormal finding of lung field: Secondary | ICD-10-CM

## 2015-06-28 ENCOUNTER — Telehealth: Payer: Self-pay | Admitting: Family Medicine

## 2015-06-28 DIAGNOSIS — R918 Other nonspecific abnormal finding of lung field: Secondary | ICD-10-CM

## 2015-06-28 NOTE — Telephone Encounter (Signed)
Ok for new referral?

## 2015-06-28 NOTE — Telephone Encounter (Signed)
Ok for new referral to [facility]

## 2015-06-28 NOTE — Telephone Encounter (Signed)
Reason for call: Pt received call from [facility] but she said that with all she has going on she wants to go with recommendation from her daughter, an RT at [facility]. Pt would like new pulmonary referral sent to Dr. Romero Belling, [facility], ph# [phone].

## 2015-06-28 NOTE — Telephone Encounter (Signed)
New referral placed.

## 2015-07-10 ENCOUNTER — Encounter: Payer: Self-pay | Admitting: *Deleted

## 2015-07-10 ENCOUNTER — Encounter: Payer: Self-pay | Admitting: Hematology & Oncology

## 2015-07-10 ENCOUNTER — Encounter: Payer: Self-pay | Admitting: Family Medicine

## 2015-07-17 ENCOUNTER — Encounter (HOSPITAL_BASED_OUTPATIENT_CLINIC_OR_DEPARTMENT_OTHER): Payer: Self-pay | Admitting: Emergency Medicine

## 2015-07-17 ENCOUNTER — Emergency Department (HOSPITAL_BASED_OUTPATIENT_CLINIC_OR_DEPARTMENT_OTHER)
Admission: EM | Admit: 2015-07-17 | Discharge: 2015-07-17 | Disposition: A | Discharge status: Home / Self Care | Payer: BLUE CROSS/BLUE SHIELD | Attending: Emergency Medicine | Admitting: Emergency Medicine

## 2015-07-17 ENCOUNTER — Encounter: Payer: Self-pay | Admitting: Hematology & Oncology

## 2015-07-17 DIAGNOSIS — F419 Anxiety disorder, unspecified: Secondary | ICD-10-CM | POA: Insufficient documentation

## 2015-07-17 DIAGNOSIS — K219 Gastro-esophageal reflux disease without esophagitis: Secondary | ICD-10-CM | POA: Diagnosis not present

## 2015-07-17 DIAGNOSIS — Y9389 Activity, other specified: Secondary | ICD-10-CM | POA: Insufficient documentation

## 2015-07-17 DIAGNOSIS — Z79899 Other long term (current) drug therapy: Secondary | ICD-10-CM | POA: Insufficient documentation

## 2015-07-17 DIAGNOSIS — Z8679 Personal history of other diseases of the circulatory system: Secondary | ICD-10-CM | POA: Insufficient documentation

## 2015-07-17 DIAGNOSIS — Y998 Other external cause status: Secondary | ICD-10-CM | POA: Insufficient documentation

## 2015-07-17 DIAGNOSIS — Z86718 Personal history of other venous thrombosis and embolism: Secondary | ICD-10-CM | POA: Insufficient documentation

## 2015-07-17 DIAGNOSIS — S6992XA Unspecified injury of left wrist, hand and finger(s), initial encounter: Secondary | ICD-10-CM | POA: Diagnosis present

## 2015-07-17 DIAGNOSIS — F329 Major depressive disorder, single episode, unspecified: Secondary | ICD-10-CM | POA: Insufficient documentation

## 2015-07-17 DIAGNOSIS — S60212A Contusion of left wrist, initial encounter: Secondary | ICD-10-CM

## 2015-07-17 DIAGNOSIS — X58XXXA Exposure to other specified factors, initial encounter: Secondary | ICD-10-CM | POA: Diagnosis not present

## 2015-07-17 DIAGNOSIS — Z87891 Personal history of nicotine dependence: Secondary | ICD-10-CM | POA: Diagnosis not present

## 2015-07-17 DIAGNOSIS — Z8673 Personal history of transient ischemic attack (TIA), and cerebral infarction without residual deficits: Secondary | ICD-10-CM | POA: Insufficient documentation

## 2015-07-17 DIAGNOSIS — Y9289 Other specified places as the place of occurrence of the external cause: Secondary | ICD-10-CM | POA: Diagnosis not present

## 2015-07-17 DIAGNOSIS — Z8742 Personal history of other diseases of the female genital tract: Secondary | ICD-10-CM | POA: Insufficient documentation

## 2015-07-17 DIAGNOSIS — Z91040 Latex allergy status: Secondary | ICD-10-CM | POA: Diagnosis not present

## 2015-07-17 DIAGNOSIS — M1990 Unspecified osteoarthritis, unspecified site: Secondary | ICD-10-CM | POA: Diagnosis not present

## 2015-07-17 NOTE — ED Notes (Signed)
Pt c/o 3 inch bruise to inside of left wrist of unknown origin, denies any SOB or headaches, no chest pain, verbalizes understanding of d/c instructions and understands return precautions.  Is to follow up with PCP tomorrow.

## 2015-07-17 NOTE — ED Notes (Signed)
Patient reports that she woke up about an hour ago with a new bruise to her left wrist. Reports "I am on xerelto and have had a craniotomy, and have had a HA all day and I am worried. My heart is racing and I dont think that I am having a panic attack"

## 2015-07-17 NOTE — ED Provider Notes (Signed)
CSN: [id number]     Arrival date & time 07/17/15  0040 History   First MD Initiated Contact with Patient 07/17/15 0203     Chief Complaint  Patient presents with  . Wrist Pain     (Consider location/radiation/quality/duration/timing/severity/associated sxs/prior Treatment) HPI  This is a 49 year old female who is on Xarelto for DVT. She has been on Xarelto since last year. She awoke earlier this morning with pain, swelling and ecchymosis to her wall are left wrist. She is not aware of any trauma. It is moderately painful and tender to palpation.   She has recently been started on Topamax and for the past few days has been having cramps in her legs. She is scheduled to have blood work done at [facility] later today. She received occipital nerve blocks 2 weeks ago and has had a persistent frontal headache as a result. She has a history of anxiety and felt her heart racing earlier but does not think that this was due to anxiety. She was not tachycardic on arrival.  Past Medical History  Diagnosis Date  . Depression   . Tachycardia   . Anxiety   . Lacunar infarction   . Endometriosis   . Headache   . PONV (postoperative nausea and vomiting)     extremely sick  . Stroke     cva  14  . GERD (gastroesophageal reflux disease)     occ  . Seizures     tx 5 yrs in middle school  continuous  . Arthritis   . Brain aneurysm   . DVT (deep venous thrombosis)    Past Surgical History  Procedure Laterality Date  . Lasar laparoscopy      for endometriosis x3  . Craniotomy Right 07/06/2014    Procedure: CRANIOTOMY INTRACRANIAL ANEURYSM FOR CAROTID;  Surgeon: Ashok Pall, MD;  Location: [place] [facility];  Service: Neurosurgery;  Laterality: Right;  right    Family History  Problem Relation Age of Onset  . Hypertension Brother     2 out of 3 brothers  . Heart attack Brother     2 out of 3 brothers  . Diabetes Brother   . Asthma Sister   . Hypertension Mother   . Heart attack Father    . Breast cancer Sister    Social History  Substance Use Topics  . Smoking status: Former Smoker -- 0.00 packs/day for 30 years    Start date: 12/28/1983    Quit date: 05/26/2014  . Smokeless tobacco: Never Used     Comment: quit smoking 3 months ago  . Alcohol Use: No   OB History    No data available     Review of Systems  All other systems reviewed and are negative.   Allergies  Imitrex; Dilaudid; Ace inhibitors; Tramadol; Codeine; and Latex  Home Medications   Prior to Admission medications   Medication Sig Start Date End Date Taking? Authorizing Provider  ALPRAZolam [name]) 0.5 MG tablet Take 1 tablet (0.5 mg total) by mouth 2 (two) times daily as needed for anxiety. 04/24/15   Colon Branch, MD  DULoxetine (CYMBALTA) 30 MG capsule Take 1 capsule (30 mg total) by mouth daily. 04/12/15   Cameron Sprang, MD  methocarbamol (ROBAXIN) 500 MG tablet Take 1 tablet (500 mg total) by mouth every 8 (eight) hours as needed for muscle spasms. 06/18/15   Wandra Arthurs, MD  metoprolol succinate (TOPROL-XL) 25 MG 24 hr tablet Take 1 tablet (25 mg  total) by mouth daily. 06/14/15   Midge Minium, MD  metoprolol tartrate (LOPRESSOR) 25 MG tablet Take 1 tablet (25 mg total) by mouth at bedtime. 06/12/15   Midge Minium, MD  nortriptyline (PAMELOR) 25 MG capsule Take 3 capsules at bedtime 04/12/15   Cameron Sprang, MD  ondansetron (ZOFRAN-ODT) 4 MG disintegrating tablet Take 1 tablet (4 mg total) by mouth every 8 (eight) hours as needed for nausea or vomiting. 05/08/15   Midge Minium, MD  Pantoprazole Sodium (PROTONIX PO) Take by mouth.    Historical Provider, MD  rivaroxaban (XARELTO) 15 MG TABS tablet Take 1 tablet (15 mg total) by mouth daily with supper. 04/15/15   Volanda Napoleon, MD  traZODone (DESYREL) 50 MG tablet TAKE 1/2 TO 1 TABLET BY MOUTH AT BEDTIME AS NEEDED FOR SLEEP 01/30/15   Midge Minium, MD   BP 144/95 mmHg  Pulse 80  Temp(Src) 97.8 F (36.6 C) (Oral)  Resp 16  Ht 5'  8" (1.727 m)  Wt 204 lb (92.534 kg)  BMI 31.03 kg/m2  SpO2 100%  LMP 12/28/2012   Physical Exam  General: Well-developed, well-nourished female in no acute distress; appearance consistent with age of record HENT: normocephalic; atraumatic Eyes: pupils equal, round and reactive to light; extraocular muscles intact Neck: supple Heart: regular rate and rhythm Lungs: clear to auscultation bilaterally Abdomen: soft; nondistended; nontender; bowel sounds present Extremities: No deformity; full range of motion; pulses normal; superficial, tender hematoma left volar wrist Neurologic: Awake, alert and oriented; motor function intact in all extremities and symmetric; no facial droop Skin: Warm and dry Psychiatric: Mildly anxious    ED Course  Procedures (including critical care time)   MDM  Patient advised to apply ice and elevate her left wrist. This is likely a spontaneous hemorrhage or hemorrhage due to minor trauma exacerbated by her anticoagulation.   Shanon Rosser, MD 07/17/15 [phone]

## 2015-07-22 ENCOUNTER — Emergency Department (HOSPITAL_BASED_OUTPATIENT_CLINIC_OR_DEPARTMENT_OTHER): Payer: BLUE CROSS/BLUE SHIELD

## 2015-07-22 ENCOUNTER — Ambulatory Visit (INDEPENDENT_AMBULATORY_CARE_PROVIDER_SITE_OTHER): Payer: BLUE CROSS/BLUE SHIELD | Admitting: Family

## 2015-07-22 ENCOUNTER — Emergency Department (HOSPITAL_BASED_OUTPATIENT_CLINIC_OR_DEPARTMENT_OTHER)
Admission: EM | Admit: 2015-07-22 | Discharge: 2015-07-22 | Disposition: A | Discharge status: Home / Self Care | Payer: BLUE CROSS/BLUE SHIELD | Attending: Emergency Medicine | Admitting: Emergency Medicine

## 2015-07-22 ENCOUNTER — Encounter (HOSPITAL_BASED_OUTPATIENT_CLINIC_OR_DEPARTMENT_OTHER): Payer: Self-pay | Admitting: *Deleted

## 2015-07-22 ENCOUNTER — Encounter: Payer: Self-pay | Admitting: Family

## 2015-07-22 ENCOUNTER — Other Ambulatory Visit: Payer: Self-pay

## 2015-07-22 VITALS — BP 110/80 | HR 71 | Temp 98.0°F | Resp 16 | Wt 202.6 lb

## 2015-07-22 DIAGNOSIS — Z86718 Personal history of other venous thrombosis and embolism: Secondary | ICD-10-CM | POA: Insufficient documentation

## 2015-07-22 DIAGNOSIS — Z91040 Latex allergy status: Secondary | ICD-10-CM | POA: Diagnosis not present

## 2015-07-22 DIAGNOSIS — F419 Anxiety disorder, unspecified: Secondary | ICD-10-CM | POA: Insufficient documentation

## 2015-07-22 DIAGNOSIS — Z8673 Personal history of transient ischemic attack (TIA), and cerebral infarction without residual deficits: Secondary | ICD-10-CM | POA: Insufficient documentation

## 2015-07-22 DIAGNOSIS — Z8679 Personal history of other diseases of the circulatory system: Secondary | ICD-10-CM | POA: Insufficient documentation

## 2015-07-22 DIAGNOSIS — Z8742 Personal history of other diseases of the female genital tract: Secondary | ICD-10-CM | POA: Insufficient documentation

## 2015-07-22 DIAGNOSIS — R300 Dysuria: Secondary | ICD-10-CM | POA: Diagnosis not present

## 2015-07-22 DIAGNOSIS — K2970 Gastritis, unspecified, without bleeding: Secondary | ICD-10-CM | POA: Diagnosis not present

## 2015-07-22 DIAGNOSIS — Z79899 Other long term (current) drug therapy: Secondary | ICD-10-CM | POA: Insufficient documentation

## 2015-07-22 DIAGNOSIS — Z8739 Personal history of other diseases of the musculoskeletal system and connective tissue: Secondary | ICD-10-CM | POA: Insufficient documentation

## 2015-07-22 DIAGNOSIS — Z87891 Personal history of nicotine dependence: Secondary | ICD-10-CM | POA: Insufficient documentation

## 2015-07-22 DIAGNOSIS — Z7901 Long term (current) use of anticoagulants: Secondary | ICD-10-CM | POA: Diagnosis not present

## 2015-07-22 DIAGNOSIS — G40909 Epilepsy, unspecified, not intractable, without status epilepticus: Secondary | ICD-10-CM | POA: Insufficient documentation

## 2015-07-22 DIAGNOSIS — R1032 Left lower quadrant pain: Secondary | ICD-10-CM | POA: Diagnosis not present

## 2015-07-22 DIAGNOSIS — K219 Gastro-esophageal reflux disease without esophagitis: Secondary | ICD-10-CM | POA: Insufficient documentation

## 2015-07-22 DIAGNOSIS — R1084 Generalized abdominal pain: Secondary | ICD-10-CM | POA: Diagnosis present

## 2015-07-22 DIAGNOSIS — R1033 Periumbilical pain: Secondary | ICD-10-CM

## 2015-07-22 DIAGNOSIS — F329 Major depressive disorder, single episode, unspecified: Secondary | ICD-10-CM | POA: Insufficient documentation

## 2015-07-22 LAB — CBC WITH DIFFERENTIAL/PLATELET
BASOS PCT: 1 % (ref 0–1)
Basophils Absolute: 0 10*3/uL (ref 0.0–0.1)
Eosinophils Absolute: 0.1 10*3/uL (ref 0.0–0.7)
Eosinophils Relative: 2 % (ref 0–5)
HCT: 40.3 % (ref 36.0–46.0)
Hemoglobin: 13.3 g/dL (ref 12.0–15.0)
Lymphocytes Relative: 42 % (ref 12–46)
Lymphs Abs: 3.2 10*3/uL (ref 0.7–4.0)
MCH: 29.7 pg (ref 26.0–34.0)
MCHC: 33 g/dL (ref 30.0–36.0)
MCV: 90 fL (ref 78.0–100.0)
MONO ABS: 0.6 10*3/uL (ref 0.1–1.0)
MONOS PCT: 8 % (ref 3–12)
Neutro Abs: 3.7 10*3/uL (ref 1.7–7.7)
Neutrophils Relative %: 47 % (ref 43–77)
Platelets: 232 10*3/uL (ref 150–400)
RBC: 4.48 MIL/uL (ref 3.87–5.11)
RDW: 13.6 % (ref 11.5–15.5)
WBC: 7.6 10*3/uL (ref 4.0–10.5)

## 2015-07-22 LAB — POCT URINALYSIS DIPSTICK
Bilirubin, UA: NEGATIVE
Blood, UA: NEGATIVE
Glucose, UA: NEGATIVE
KETONES UA: NEGATIVE
Leukocytes, UA: NEGATIVE
Nitrite, UA: NEGATIVE
PH UA: 6
Protein, UA: NEGATIVE
Spec Grav, UA: >=1.03
Urobilinogen, UA: NEGATIVE

## 2015-07-22 LAB — COMPREHENSIVE METABOLIC PANEL
ALT: 39 U/L (ref 14–54)
ANION GAP: 8 (ref 5–15)
AST: 34 U/L (ref 15–41)
Albumin: 4.3 g/dL (ref 3.5–5.0)
Alkaline Phosphatase: 77 U/L (ref 38–126)
BILIRUBIN TOTAL: 0.6 mg/dL (ref 0.3–1.2)
BUN: 15 mg/dL (ref 6–20)
CALCIUM: 9.2 mg/dL (ref 8.9–10.3)
CO2: 27 mmol/L (ref 22–32)
CREATININE: 0.82 mg/dL (ref 0.44–1.00)
Chloride: 103 mmol/L (ref 101–111)
Glucose, Bld: 96 mg/dL (ref 65–99)
POTASSIUM: 3.6 mmol/L (ref 3.5–5.1)
Sodium: 138 mmol/L (ref 135–145)
Total Protein: 7.4 g/dL (ref 6.5–8.1)

## 2015-07-22 LAB — I-STAT CG4 LACTIC ACID, ED: Lactic Acid, Venous: 0.31 mmol/L — ABNORMAL LOW (ref 0.5–2.0)

## 2015-07-22 LAB — LIPASE, BLOOD: Lipase: 23 U/L (ref 22–51)

## 2015-07-22 MED ORDER — IOHEXOL 300 MG/ML  SOLN
100.0000 mL | Freq: Once | INTRAMUSCULAR | Status: AC | PRN
  Administered 2015-07-22: 100 mL via INTRAVENOUS

## 2015-07-22 MED ORDER — SUCRALFATE 1 G PO TABS: 1.0000 g | ORAL_TABLET | Freq: Three times a day (TID) | ORAL | Status: DC

## 2015-07-22 MED ORDER — IOHEXOL 300 MG/ML  SOLN
25.0000 mL | Freq: Once | INTRAMUSCULAR | Status: AC | PRN
  Administered 2015-07-22: 25 mL via ORAL

## 2015-07-22 MED ORDER — MORPHINE SULFATE (PF) 4 MG/ML IV SOLN
4.0000 mg | Freq: Once | INTRAVENOUS | Status: AC
  Administered 2015-07-22: 4 mg via INTRAVENOUS
  Filled 2015-07-22: qty 1

## 2015-07-22 MED ORDER — PROMETHAZINE HCL 25 MG/ML IJ SOLN
12.5000 mg | Freq: Once | INTRAMUSCULAR | Status: AC
  Administered 2015-07-22: 12.5 mg via INTRAVENOUS
  Filled 2015-07-22: qty 1

## 2015-07-22 MED ORDER — PANTOPRAZOLE SODIUM 40 MG PO TBEC: 40.0000 mg | DELAYED_RELEASE_TABLET | Freq: Every day | ORAL | Status: DC

## 2015-07-22 NOTE — ED Provider Notes (Signed)
CSN: [id number]     Arrival date & time 07/22/15  1823 History   First MD Initiated Contact with Patient 07/22/15 1909     Chief Complaint  Patient presents with  . Abdominal Pain     (Consider location/radiation/quality/duration/timing/severity/associated sxs/prior Treatment) Patient is a 49 y.o. female presenting with abdominal pain. The history is provided by the patient.  Abdominal Pain Pain location:  Generalized Pain quality: cramping, gnawing and shooting   Pain radiates to:  Does not radiate Pain severity:  Severe Onset quality:  Gradual Duration:  2 days Timing:  Constant Progression:  Worsening Chronicity:  New Context comment:  Started spontaneously yesterday Relieved by:  Nothing Worsened by:  Nothing tried Ineffective treatments:  None tried Associated symptoms: belching, flatus and nausea   Associated symptoms: no anorexia, no chest pain, no chills, no constipation, no cough, no diarrhea, no dysuria, no fever, no hematuria, no shortness of breath, no vaginal bleeding, no vaginal discharge and no vomiting   Risk factors: has not had multiple surgeries, not pregnant and no recent hospitalization     Past Medical History  Diagnosis Date  . Depression   . Tachycardia   . Anxiety   . Lacunar infarction   . Endometriosis   . Headache   . PONV (postoperative nausea and vomiting)     extremely sick  . Stroke     cva  14  . GERD (gastroesophageal reflux disease)     occ  . Seizures     tx 5 yrs in middle school  continuous  . Arthritis   . Brain aneurysm   . DVT (deep venous thrombosis)    Past Surgical History  Procedure Laterality Date  . Lasar laparoscopy      for endometriosis x3  . Craniotomy Right 07/06/2014    Procedure: CRANIOTOMY INTRACRANIAL ANEURYSM FOR CAROTID;  Surgeon: Ashok Pall, MD;  Location: [facility] [place] [facility];  Service: Neurosurgery;  Laterality: Right;  right    Family History  Problem Relation Age of Onset  . Hypertension Brother     2  out of 3 brothers  . Heart attack Brother     2 out of 3 brothers  . Diabetes Brother   . Asthma Sister   . Hypertension Mother   . Heart attack Father   . Breast cancer Sister    Social History  Substance Use Topics  . Smoking status: Former Smoker -- 0.00 packs/day for 30 years    Start date: 12/28/1983    Quit date: 05/26/2014  . Smokeless tobacco: Never Used     Comment: quit smoking 3 months ago  . Alcohol Use: No   OB History    No data available     Review of Systems  Constitutional: Negative for fever and chills.  Respiratory: Negative for cough and shortness of breath.   Cardiovascular: Negative for chest pain.  Gastrointestinal: Positive for nausea, abdominal pain and flatus. Negative for vomiting, diarrhea, constipation and anorexia.  Genitourinary: Negative for dysuria, hematuria, vaginal bleeding and vaginal discharge.  All other systems reviewed and are negative.     Allergies  Imitrex; Dilaudid; Ace inhibitors; Ondansetron; Tramadol; Codeine; and Latex  Home Medications   Prior to Admission medications   Medication Sig Start Date End Date Taking? Authorizing Provider  ALPRAZolam [name]) 0.5 MG tablet Take 1 tablet (0.5 mg total) by mouth 2 (two) times daily as needed for anxiety. 04/24/15   Colon Branch, MD  DULoxetine (CYMBALTA) 30 MG capsule  Take 1 capsule (30 mg total) by mouth daily. 04/12/15   Cameron Sprang, MD  famotidine (PEPCID) 20 MG tablet Take 20 mg by mouth daily.    Historical Provider, MD  methocarbamol (ROBAXIN) 500 MG tablet Take 1 tablet (500 mg total) by mouth every 8 (eight) hours as needed for muscle spasms. Patient not taking: Reported on 07/22/2015 06/18/15   Wandra Arthurs, MD  metoprolol succinate (TOPROL-XL) 25 MG 24 hr tablet Take 1 tablet (25 mg total) by mouth daily. 06/14/15   Midge Minium, MD  promethazine (PHENERGAN) 12.5 MG tablet Take 12.5 mg by mouth as needed. 07/07/15   Historical Provider, MD  rivaroxaban (XARELTO) 15 MG TABS  tablet Take 1 tablet (15 mg total) by mouth daily with supper. 04/15/15   Volanda Napoleon, MD  topiramate (TOPAMAX) 25 MG tablet Take 25 mg by mouth. Take 1 /day for 2 weeks, increase by 1 tab every 2 weeks until 4/day. 06/13/15   Historical Provider, MD  traZODone (DESYREL) 50 MG tablet TAKE 1/2 TO 1 TABLET BY MOUTH AT BEDTIME AS NEEDED FOR SLEEP 01/30/15   Midge Minium, MD   BP 130/89 mmHg  Pulse 72  Temp(Src) 97.8 F (36.6 C) (Oral)  Resp 20  Ht 5\' 8"  (1.727 m)  Wt 202 lb (91.627 kg)  BMI 30.72 kg/m2  SpO2 100%  LMP 12/28/2012 Physical Exam  Constitutional: She is oriented to person, place, and time. She appears well-developed and well-nourished. No distress.  HENT:  Head: Normocephalic and atraumatic.  Mouth/Throat: Oropharynx is clear and moist.  Eyes: Conjunctivae and EOM are normal. Pupils are equal, round, and reactive to light.  Neck: Normal range of motion. Neck supple.  Cardiovascular: Normal rate, regular rhythm and intact distal pulses.   No murmur heard. Pulmonary/Chest: Effort normal and breath sounds normal. No respiratory distress. She has no wheezes. She has no rales.  Abdominal: Soft. Normal appearance. She exhibits distension. There is generalized tenderness. There is no rebound, no guarding, no CVA tenderness and negative Murphy's sign.  Musculoskeletal: Normal range of motion. She exhibits no edema or tenderness.  Neurological: She is alert and oriented to person, place, and time.  Skin: Skin is warm and dry. No rash noted. No erythema.  Psychiatric: She has a normal mood and affect. Her behavior is normal.  Nursing note and vitals reviewed.   ED Course  Procedures (including critical care time) Labs Review Labs Reviewed  I-STAT CG4 LACTIC ACID, ED - Abnormal; Notable for the following:    Lactic Acid, Venous 0.31 (*)    All other components within normal limits  CBC WITH DIFFERENTIAL/PLATELET  COMPREHENSIVE METABOLIC PANEL  LIPASE, BLOOD    Imaging  Review Ct Abdomen Pelvis W Contrast  07/22/2015   CLINICAL DATA:  Epigastric pain extending into lower pelvic region with nausea since yesterday. History of endometriosis.  EXAM: CT ABDOMEN AND PELVIS WITH CONTRAST  TECHNIQUE: Multidetector CT imaging of the abdomen and pelvis was performed using the standard protocol following bolus administration of intravenous contrast.  CONTRAST:  108mL OMNIPAQUE IOHEXOL 300 MG/ML SOLN, 157mL OMNIPAQUE IOHEXOL 300 MG/ML SOLN  COMPARISON:  Chest CT 06/18/2015  FINDINGS: Lung bases demonstrate stable calcified and noncalcified nodules within the lung bases.  Abdominal images demonstrate minimal cholelithiasis.  The liver, spleen, pancreas and left adrenal gland are within normal. Minimal nodularity to the right adrenal gland unchanged likely adenomatous change. Appendix is normal.  Kidneys are normal in size without hydronephrosis or nephrolithiasis.  Ureters are normal.  There is a retro aortic left renal vein. Remaining vascular structures are within normal. Mesentery is normal.  There is mild diverticulosis of the colon. Small bowel is within normal.  Pelvic images demonstrate the bladder, uterus, adnexa and rectum to be within normal.  Minimal degenerate change of the spine.  IMPRESSION: No acute findings in the abdomen/pelvis.  Minimal cholelithiasis.  Mild diverticulosis of the colon.  Multiple calcified and noncalcified pulmonary nodules unchanged. Recommend followup CT as recommended previously as patient is due for follow-up CT November 2016. This recommendation follows the consensus statement: Guidelines for Management of Small Pulmonary Nodules Detected on CT Scans: A Statement from the [employer] as published in Radiology 2005; 237:395-400. Online at: https://www.arnold.com/.   Electronically Signed   By: Marin Olp M.D.   On: 07/22/2015 20:42   I have personally reviewed and evaluated these images and lab results as part of  my medical decision-making.   EKG Interpretation None      MDM   Final diagnoses:  Periumbilical abdominal pain  Gastritis    Patient is a 49 year old female who has no prior history of abdominal pains presenting from PCPs office with a 2 day history of worsening diffuse abdominal pain and nausea. She denies any vomiting or diarrhea. Pain is not affected by food or movement. She has never had pain like this before. No prior abdominal surgeries other than laser surgery for endometriosis. She no longer has menses. She denies any urinary symptoms or vaginal symptoms. On exam patient has diffuse abdominal tenderness worse in the right lower quadrant. Concern for possible appendicitis versus diverticulitis versus pancreatitis versus gastritis. Lower suspicion for cholecystitis this patient does not have significant right upper quadrant pain or Murphy sign. UA done in the office was within normal limits. CBC, CMP, lipase, lactate, CT of the abdomen and pelvis pending. Patient given pain and nausea control.  8:49 PM Labs and imaging all wnl.  No acute findings on CT.  Findings discussed with the pt and she was d/ced home to f/u with PCP.   Blanchie Dessert, MD 07/22/15 2103

## 2015-07-22 NOTE — Progress Notes (Signed)
Subjective:    Patient ID: Melissa Morgan, female    DOB: 06-03-1966, 49 y.o.   MRN: [id number]  HPI  Melissa Morgan is a 49 yr old female who presents today with chief complaint of abdominal pain.  Notes that her urine has been "more yellow than usual." Reports that the abdominal pain started 2 days.  (has chronic nausea but notes that this is some worse with her abdominal pain). Throughout the abdomen and also has some low back pain.  She reports + mild dysuria this AM.  Reports intermittent leg cramps. Stools have been soft and brown. Denies black/bloody stools. Denies vomiting.  LMP feb 2014.     Review of Systems See HPI  Past Medical History  Diagnosis Date  . Depression   . Tachycardia   . Anxiety   . Lacunar infarction   . Endometriosis   . Headache   . PONV (postoperative nausea and vomiting)     extremely sick  . Stroke     cva  14  . GERD (gastroesophageal reflux disease)     occ  . Seizures     tx 5 yrs in middle school  continuous  . Arthritis   . Brain aneurysm   . DVT (deep venous thrombosis)     Social History   Social History  . Marital Status: Legally Separated    Spouse Name: N/A  . Number of Children: N/A  . Years of Education: N/A   Occupational History  . Not on file.   Social History Main Topics  . Smoking status: Former Smoker -- 0.00 packs/day for 30 years    Start date: 12/28/1983    Quit date: 05/26/2014  . Smokeless tobacco: Never Used     Comment: quit smoking 3 months ago  . Alcohol Use: No  . Drug Use: No  . Sexual Activity: Not on file   Other Topics Concern  . Not on file   Social History Narrative    Past Surgical History  Procedure Laterality Date  . Lasar laparoscopy      for endometriosis x3  . Craniotomy Right 07/06/2014    Procedure: CRANIOTOMY INTRACRANIAL ANEURYSM FOR CAROTID;  Surgeon: Ashok Pall, MD;  Location: [facility];  Service: Neurosurgery;  Laterality: Right;  right     Family History  Problem  Relation Age of Onset  . Hypertension Brother     2 out of 3 brothers  . Heart attack Brother     2 out of 3 brothers  . Diabetes Brother   . Asthma Sister   . Hypertension Mother   . Heart attack Father   . Breast cancer Sister     Allergies  Allergen Reactions  . Imitrex [Sumatriptan] Other (See Comments)    Increase heart rate and chest pain   . Dilaudid [Hydromorphone Hcl] Nausea And Vomiting  . Ace Inhibitors Other (See Comments)    REACTION: unknown  . Ondansetron Other (See Comments)    Constipation and depression  . Tramadol Other (See Comments)    Palpitations; Dizziness  . Codeine Nausea Only  . Latex Itching    Current Outpatient Prescriptions on File Prior to Visit  Medication Sig Dispense Refill  . ALPRAZolam (XANAX) 0.5 MG tablet Take 1 tablet (0.5 mg total) by mouth 2 (two) times daily as needed for anxiety. 60 tablet 1  . DULoxetine (CYMBALTA) 30 MG capsule Take 1 capsule (30 mg total) by mouth daily. 30 capsule 3  . metoprolol  succinate (TOPROL-XL) 25 MG 24 hr tablet Take 1 tablet (25 mg total) by mouth daily. 30 tablet 6  . rivaroxaban (XARELTO) 15 MG TABS tablet Take 1 tablet (15 mg total) by mouth daily with supper. 30 tablet 2  . traZODone (DESYREL) 50 MG tablet TAKE 1/2 TO 1 TABLET BY MOUTH AT BEDTIME AS NEEDED FOR SLEEP 30 tablet 3  . methocarbamol (ROBAXIN) 500 MG tablet Take 1 tablet (500 mg total) by mouth every 8 (eight) hours as needed for muscle spasms. (Patient not taking: Reported on 07/22/2015) 15 tablet 0   No current facility-administered medications on file prior to visit.    BP 110/80 mmHg  Pulse 71  Temp(Src) 98 F (36.7 C) (Oral)  Resp 16  Wt 202 lb 9.6 oz (91.899 kg)  SpO2 100%  LMP 12/28/2012       Objective:   Physical Exam  Constitutional: She is oriented to person, place, and time. She appears well-developed and well-nourished.  HENT:  Head: Normocephalic and atraumatic.  Cardiovascular: Normal rate, regular rhythm and  normal heart sounds.   No murmur heard. Pulmonary/Chest: Effort normal and breath sounds normal. No respiratory distress. She has no wheezes.  Abdominal: Soft. Bowel sounds are normal. She exhibits no distension. There is tenderness in the left lower quadrant.  Neurological: She is alert and oriented to person, place, and time.  Skin: Skin is warm and dry.  Psychiatric: She has a normal mood and affect. Her behavior is normal. Judgment and thought content normal.          Assessment & Plan:  LLQ abdominal pain-  UA unremarkable. Sxs suspicious for diverticulitis.  Needs CT abd/pelvis and labs. I am unable to obtain these tests in the office at this hour.  I have advised pt to proceed to ED for further evaluation and treatment.  Report given to ER charge nurse.

## 2015-07-22 NOTE — Progress Notes (Signed)
Pre visit review using our clinic review tool, if applicable. No additional management support is needed unless otherwise documented below in the visit note. 

## 2015-07-22 NOTE — Patient Instructions (Addendum)
Please proceed to the ER 

## 2015-07-22 NOTE — ED Notes (Signed)
Abdominal pain since yesterday. She was seen by Debbrah Alar and sent here to r/o diverticulitis.

## 2015-07-22 NOTE — ED Notes (Signed)
Spoke with Dr Inda Castle who would like pt to evaluation for LLQ abd pain in ED

## 2015-07-22 NOTE — ED Notes (Signed)
MD at bedside. 

## 2015-07-30 ENCOUNTER — Other Ambulatory Visit: Payer: Self-pay | Admitting: Hematology & Oncology

## 2015-08-01 ENCOUNTER — Encounter: Payer: Self-pay | Admitting: Family Medicine

## 2015-08-05 ENCOUNTER — Telehealth: Payer: Self-pay | Admitting: Family Medicine

## 2015-08-05 ENCOUNTER — Other Ambulatory Visit (HOSPITAL_BASED_OUTPATIENT_CLINIC_OR_DEPARTMENT_OTHER): Payer: BLUE CROSS/BLUE SHIELD

## 2015-08-05 ENCOUNTER — Ambulatory Visit (HOSPITAL_BASED_OUTPATIENT_CLINIC_OR_DEPARTMENT_OTHER): Payer: BLUE CROSS/BLUE SHIELD | Admitting: Family

## 2015-08-05 ENCOUNTER — Encounter: Payer: Self-pay | Admitting: Family

## 2015-08-05 VITALS — BP 119/79 | HR 75 | Temp 98.0°F | Resp 16 | Ht 68.0 in | Wt 202.0 lb

## 2015-08-05 DIAGNOSIS — I82402 Acute embolism and thrombosis of unspecified deep veins of left lower extremity: Secondary | ICD-10-CM

## 2015-08-05 LAB — COMPREHENSIVE METABOLIC PANEL (CC13)
ALBUMIN: 4.1 g/dL (ref 3.5–5.0)
ALK PHOS: 87 U/L (ref 40–150)
ALT: 26 U/L (ref 0–55)
AST: 23 U/L (ref 5–34)
Anion Gap: 8 mEq/L (ref 3–11)
BUN: 11.1 mg/dL (ref 7.0–26.0)
CO2: 26 mEq/L (ref 22–29)
CREATININE: 0.9 mg/dL (ref 0.6–1.1)
Calcium: 9.4 mg/dL (ref 8.4–10.4)
Chloride: 106 mEq/L (ref 98–109)
EGFR: 74 mL/min/{1.73_m2} — ABNORMAL LOW (ref >90)
Glucose: 118 mg/dl (ref 70–140)
Potassium: 3.9 mEq/L (ref 3.5–5.1)
Sodium: 141 mEq/L (ref 136–145)
Total Bilirubin: 0.52 mg/dL (ref 0.20–1.20)
Total Protein: 6.9 g/dL (ref 6.4–8.3)

## 2015-08-05 LAB — CBC WITH DIFFERENTIAL (CANCER CENTER ONLY)
BASO#: 0 10*3/uL (ref 0.0–0.2)
BASO%: 0.5 % (ref 0.0–2.0)
EOS%: 2.5 % (ref 0.0–7.0)
Eosinophils Absolute: 0.2 10*3/uL (ref 0.0–0.5)
HCT: 39.5 % (ref 34.8–46.6)
HEMOGLOBIN: 13.2 g/dL (ref 11.6–15.9)
LYMPH#: 2.7 10*3/uL (ref 0.9–3.3)
LYMPH%: 46.1 % (ref 14.0–48.0)
MCH: 29.9 pg (ref 26.0–34.0)
MCHC: 33.4 g/dL (ref 32.0–36.0)
MCV: 90 fL (ref 81–101)
MONO#: 0.6 10*3/uL (ref 0.1–0.9)
MONO%: 10.8 % (ref 0.0–13.0)
NEUT%: 40.1 % (ref 39.6–80.0)
NEUTROS ABS: 2.4 10*3/uL (ref 1.5–6.5)
Platelets: 229 10*3/uL (ref 145–400)
RBC: 4.41 10*6/uL (ref 3.70–5.32)
RDW: 13.3 % (ref 11.1–15.7)
WBC: 5.9 10*3/uL (ref 3.9–10.0)

## 2015-08-05 MED ORDER — ALPRAZOLAM 0.5 MG PO TABS: 0.5000 mg | ORAL_TABLET | Freq: Two times a day (BID) | ORAL | Status: DC | PRN

## 2015-08-05 NOTE — Telephone Encounter (Signed)
Medication filled to pharmacy as requested.   

## 2015-08-05 NOTE — Telephone Encounter (Signed)
Ok for #60, 1 refill 

## 2015-08-05 NOTE — Progress Notes (Signed)
Patient is getting flu vaccine next week

## 2015-08-05 NOTE — Telephone Encounter (Signed)
Last V 02/07/15 Alprazolam last filled 04/24/15 #60 with 1

## 2015-08-05 NOTE — Telephone Encounter (Signed)
Relation to CH:YIFO Call back [place]: [facility] - [place], [place] - [address] AT [place] [phone] (Phone) [phone] (Fax)         Reason for call:  Patient requesting a refill ALPRAZolam (XANAX) 0.5 MG tablet and traZODone (DESYREL) 50 MG tablet

## 2015-08-05 NOTE — Progress Notes (Signed)
Hematology and Oncology Follow Up Visit  Melissa Morgan [id number] 02/05/1966 49 y.o. 08/05/2015   Principle Diagnosis:  Thrombus in the right peroneal vein   Current Therapy:   Xarelto 5mg  po q day    Interim History:  Melissa Morgan is here today for a follow-up. She is doing fairly well at this time. She is still under a good deal of stress at home with her family. She is doing remarkably well recuperating from her craniotomy last year.  She has done well on Xarelto.  She did go to the ED in August with swelling in her left wrist. She showed me picture on her phone an it appeared to be a broken superficial blood vessel. Her CT angio at that time was negative for PE it did show multiple bilateral pulmonary nodules. She had a repeat CT in September which showed no changes in the nodules. She will have another scan at the end of the year at [facility]  She has had no SOB, chest pain, palpitations, fever, chills, vomiting, changes in bowel or bladder habits. She has occasional cramping in her lower abdomen that comes and goes.  She did have a sleep study and was diagnosed with sleep apnea. She is going to be fitted for a CPAP mask. She is hoping this will help her rest better at night and not wake up feeling tired.  She has had no swelling, numbness or tingling in her extremities. She does have leg cramping occassionally and post phlebitic pain in the right leg at times. She feels that the cramping may be due to her Topamaax.  She has a good appetite but does have certain foods that she can not eat. These cause her nausea so she avoids them.  She is staying active and plans to try and lose some weight walking and doing yoga.   Medications:    Medication List       This list is accurate as of: 08/05/15  9:25 AM.  Always use your most recent med list.               ALPRAZolam 0.5 MG tablet  Commonly known as:  XANAX  Take 1 tablet (0.5 mg total) by mouth 2 (two) times daily as needed for  anxiety.     DULoxetine 30 MG capsule  Commonly known as:  CYMBALTA  Take 1 capsule (30 mg total) by mouth daily.     famotidine 20 MG tablet  Commonly known as:  PEPCID  Take 20 mg by mouth daily.     methocarbamol 500 MG tablet  Commonly known as:  ROBAXIN  Take 1 tablet (500 mg total) by mouth every 8 (eight) hours as needed for muscle spasms.     metoprolol succinate 25 MG 24 hr tablet  Commonly known as:  TOPROL-XL  Take 1 tablet (25 mg total) by mouth daily.     pantoprazole 40 MG tablet  Commonly known as:  PROTONIX  Take 1 tablet (40 mg total) by mouth daily.     promethazine 12.5 MG tablet  Commonly known as:  PHENERGAN  Take 12.5 mg by mouth as needed.     sucralfate 1 G tablet  Commonly known as:  CARAFATE  Take 1 tablet (1 g total) by mouth 4 (four) times daily -  with meals and at bedtime.     topiramate 25 MG tablet  Commonly known as:  TOPAMAX  Take 25 mg by mouth. Take 1 /day for 2 weeks,  increase by 1 tab every 2 weeks until 4/day.     traZODone 50 MG tablet  Commonly known as:  DESYREL  TAKE 1/2 TO 1 TABLET BY MOUTH AT BEDTIME AS NEEDED FOR SLEEP     XARELTO 15 MG Tabs tablet  Generic drug:  Rivaroxaban  TAKE 1 TABLET(15 MG) BY MOUTH DAILY WITH DINNER        Allergies:  Allergies  Allergen Reactions  . Imitrex [Sumatriptan] Other (See Comments)    Increase heart rate and chest pain   . Dilaudid [Hydromorphone Hcl] Nausea And Vomiting  . Ace Inhibitors Other (See Comments)    REACTION: unknown  . Ondansetron Other (See Comments)    Constipation and depression  . Tramadol Other (See Comments)    Palpitations; Dizziness  . Codeine Nausea Only  . Latex Itching    Past Medical History, Surgical history, Social history, and Family History were reviewed and updated.  Review of Systems: All other 10 point review of systems is negative.   Physical Exam:  vitals were not taken for this visit.  Wt Readings from Last 3 Encounters:  07/22/15  202 lb (91.627 kg)  07/22/15 202 lb 9.6 oz (91.899 kg)  07/17/15 204 lb (92.534 kg)    Ocular: Sclerae unicteric, pupils equal, round and reactive to light Ear-nose-throat: Oropharynx clear, dentition fair Lymphatic: No cervical or supraclavicular adenopathy Lungs no rales or rhonchi, good excursion bilaterally Heart regular rate and rhythm, no murmur appreciated Abd soft, nontender, positive bowel sounds MSK no focal spinal tenderness, no joint edema Neuro: non-focal, well-oriented, appropriate affect Breasts: Deferred  Lab Results  Component Value Date   WBC 7.6 07/22/2015   HGB 13.3 07/22/2015   HCT 40.3 07/22/2015   MCV 90.0 07/22/2015   PLT 232 07/22/2015   No results found for: FERRITIN, IRON, TIBC, UIBC, IRONPCTSAT Lab Results  Component Value Date   RBC 4.48 07/22/2015   No results found for: KPAFRELGTCHN, LAMBDASER, KAPLAMBRATIO No results found for: IGGSERUM, IGA, IGMSERUM No results found for: Odetta Pink, SPEI   Chemistry      Component Value Date/Time   NA 138 07/22/2015 1945   NA 144 12/31/2014 1329   K 3.6 07/22/2015 1945   K 3.8 12/31/2014 1329   CL 103 07/22/2015 1945   CL 100 12/31/2014 1329   CO2 27 07/22/2015 1945   CO2 30 12/31/2014 1329   BUN 15 07/22/2015 1945   BUN 11 12/31/2014 1329   CREATININE 0.82 07/22/2015 1945   CREATININE 1.0 12/31/2014 1329      Component Value Date/Time   CALCIUM 9.2 07/22/2015 1945   CALCIUM 9.3 12/31/2014 1329   ALKPHOS 77 07/22/2015 1945   ALKPHOS 59 12/31/2014 1329   AST 34 07/22/2015 1945   AST 24 12/31/2014 1329   ALT 39 07/22/2015 1945   ALT 22 12/31/2014 1329   BILITOT 0.6 07/22/2015 1945   BILITOT 0.70 12/31/2014 1329     Impression and Plan: Melissa Morgan is 49 yo white female with a post surgical thrombus in the right leg that has since resolved. She has done well on low dose Xarelto and has had no recurrent thrombus.  I did give her a  prescription for compression stockings. To wear when she knows she will be on her feet for an extended period of time.  She will continue on her some dose of Xarelto.  We will plan to see her back in 4 months for labs  and follow-up. She knows to contact us with any questions or concerns. We can certainly see her sooner if need be.   Eliezer Bottom, NP 9/26/20169:25 AM

## 2015-08-23 ENCOUNTER — Encounter: Payer: Self-pay | Admitting: Internal Medicine

## 2015-08-23 ENCOUNTER — Ambulatory Visit (HOSPITAL_BASED_OUTPATIENT_CLINIC_OR_DEPARTMENT_OTHER)
Admission: RE | Admit: 2015-08-23 | Discharge: 2015-08-23 | Disposition: A | Discharge status: Home / Self Care | Payer: BLUE CROSS/BLUE SHIELD | Source: Ambulatory Visit | Attending: Internal Medicine | Admitting: Internal Medicine

## 2015-08-23 ENCOUNTER — Ambulatory Visit (INDEPENDENT_AMBULATORY_CARE_PROVIDER_SITE_OTHER): Payer: BLUE CROSS/BLUE SHIELD | Admitting: Internal Medicine

## 2015-08-23 VITALS — BP 106/68 | HR 66 | Temp 97.5°F | Ht 68.0 in | Wt 200.5 lb

## 2015-08-23 DIAGNOSIS — R51 Headache: Secondary | ICD-10-CM

## 2015-08-23 DIAGNOSIS — R519 Headache, unspecified: Secondary | ICD-10-CM

## 2015-08-23 DIAGNOSIS — I671 Cerebral aneurysm, nonruptured: Secondary | ICD-10-CM | POA: Insufficient documentation

## 2015-08-23 MED ORDER — PROMETHAZINE HCL 12.5 MG PO TABS: 12.5000 mg | ORAL_TABLET | Freq: Four times a day (QID) | ORAL | Status: DC | PRN

## 2015-08-23 NOTE — Patient Instructions (Signed)
Please see your eye doctor as soon as possible  Rest, drink plenty of fluids, take Tylenol and hydroxycine Also phenergan   Go to the ER if you have severe pain, increase headache, not improving in the next 24 hours.  Please contact your neurologist on Monday.

## 2015-08-23 NOTE — Progress Notes (Signed)
Subjective:    Patient ID: Melissa Morgan, female    DOB: 1966/03/06, 49 y.o.   MRN: [id number]  DOS:  08/23/2015 Type of visit - description : Acute visit Interval history:  Yesterday, started with a migraine headache, it felt a little different from her classic migraines which is usually occipital, this headache is more frontal. She did have some nausea. This morning, she developed a new symptom >> having a pinching/pulling and sharp pain behind the left eye going all the way back, on-off   Review of Systems  Denies fever chills. No sinus pain or congestion No vomiting, diarrhea blood in the stools. No gross hematuria. Mild increase emotional distress. No dizziness, diplopia, slurred speech, motor deficits. Her vision was slt blurrd yesterday but that seems to be  better. Eyes were slightly red yesterday but not today. No   photophobia  Past Medical History  Diagnosis Date  . Depression   . Tachycardia   . Anxiety   . Lacunar infarction ([place])   . Endometriosis   . Headache   . PONV (postoperative nausea and vomiting)     extremely sick  . Stroke ([place])     cva  14  . GERD (gastroesophageal reflux disease)     occ  . Seizures ([place])     tx 5 yrs in middle school  continuous  . Arthritis   . Brain aneurysm   . DVT (deep venous thrombosis) [facility])     Past Surgical History  Procedure Laterality Date  . Lasar laparoscopy      for endometriosis x3  . Craniotomy Right 07/06/2014    Procedure: CRANIOTOMY INTRACRANIAL ANEURYSM FOR CAROTID;  Surgeon: Ashok Pall, MD;  Location: [facility];  Service: Neurosurgery;  Laterality: Right;  right     Social History   Social History  . Marital Status: Legally Separated    Spouse Name: N/A  . Number of Children: N/A  . Years of Education: N/A   Occupational History  . Not on file.   Social History Main Topics  . Smoking status: Former Smoker -- 0.00 packs/day for 30 years    Start date: 12/28/1983    Quit date:  05/26/2014  . Smokeless tobacco: Never Used     Comment: quit smoking 3 months ago  . Alcohol Use: No  . Drug Use: No  . Sexual Activity: Not on file   Other Topics Concern  . Not on file   Social History Narrative        Medication List       This list is accurate as of: 08/23/15 11:59 PM.  Always use your most recent med list.               ALPRAZolam 0.5 MG tablet  Commonly known as:  XANAX  Take 1 tablet (0.5 mg total) by mouth 2 (two) times daily as needed for anxiety.     DULoxetine 30 MG capsule  Commonly known as:  CYMBALTA  Take 1 capsule (30 mg total) by mouth daily.     famotidine 20 MG tablet  Commonly known as:  PEPCID  Take 20 mg by mouth daily.     hydrOXYzine 10 MG tablet  Commonly known as:  ATARAX/VISTARIL  10-20 mg every 8 hours as needed for severe headache     metoprolol succinate 25 MG 24 hr tablet  Commonly known as:  TOPROL-XL  Take 1 tablet (25 mg total) by mouth daily.  pantoprazole 40 MG tablet  Commonly known as:  PROTONIX  Take 1 tablet (40 mg total) by mouth daily.     promethazine 12.5 MG tablet  Commonly known as:  PHENERGAN  Take 1 tablet (12.5 mg total) by mouth every 6 (six) hours as needed.     sucralfate 1 G tablet  Commonly known as:  CARAFATE  Take 1 tablet (1 g total) by mouth 4 (four) times daily -  with meals and at bedtime.     topiramate 25 MG tablet  Commonly known as:  TOPAMAX  Take 25 mg by mouth. Take 1 /day for 2 weeks, increase by 1 tab every 2 weeks until 4/day.     traZODone 50 MG tablet  Commonly known as:  DESYREL  TAKE 1/2 TO 1 TABLET BY MOUTH AT BEDTIME AS NEEDED FOR SLEEP     XARELTO 15 MG Tabs tablet  Generic drug:  Rivaroxaban  TAKE 1 TABLET(15 MG) BY MOUTH DAILY WITH DINNER           Objective:   Physical Exam BP 106/68 mmHg  Pulse 66  Temp(Src) 97.5 F (36.4 C) (Oral)  Ht 5\' 8"  (1.727 m)  Wt 200 lb 8 oz (90.946 kg)  BMI 30.49 kg/m2  SpO2 96%  LMP 12/28/2012 General:     Well developed, well nourished . NAD.  HEENT:  Normocephalic . Face symmetric, atraumatic. Eyes: EOMI, pupils equal and reactive, no conjunctival erythema, anterior chambers normal. Undilated funduscopy: Grossly normal bilaterally. Neck: Full range of motion Lungs:  CTA B Normal respiratory effort, no intercostal retractions, no accessory muscle use. Heart: RRR,  no murmur.  No pretibial edema bilaterally  Skin: Not pale. Not jaundice Neurologic:  alert & oriented X3.  Speech normal, gait appropriate for age and unassisted. Motor and DTRs symmetric Psych--  Cognition and judgment appear intact.  Cooperative with normal attention span and concentration.  Behavior appropriate. No anxious or depressed appearing.      Assessment & Plan:   History of migraines, right MCA aneurysm status post clipping, DVT, anxiety, anticoagulated  Seen by local neurology 6- 2016 with increasing headaches and symptoms suggestion  of right-sided neuralgia. Seen by neurology [facility] again [phone] with severe headache, MRI/MRA was done 06/17/2015 see report. Presents today with headache, new symptom of sharp pain behind the left eye. Neurological exam is symmetric, eye exam is benign. Plan: CT head now see ophthalmology ASAP ER if symptoms severe, crescendo or not improving in the next 24 hours Rest, fluids, Tylenol, hydroxyzine and Phenergan ( patient states that usually helps)

## 2015-08-23 NOTE — Progress Notes (Signed)
Pre visit review using our clinic review tool, if applicable. No additional management support is needed unless otherwise documented below in the visit note. 

## 2015-08-28 ENCOUNTER — Telehealth: Payer: Self-pay

## 2015-08-28 NOTE — Telephone Encounter (Signed)
LMOM informing Pt to return call regarding headaches, called to check to see how she is doing since OV with Dr. Larose Kells on 08/23/2015.

## 2015-08-29 ENCOUNTER — Other Ambulatory Visit: Payer: Self-pay | Admitting: Hematology & Oncology

## 2015-09-02 NOTE — Telephone Encounter (Signed)
Unable to contact Pt.

## 2015-09-06 ENCOUNTER — Other Ambulatory Visit: Payer: Self-pay | Admitting: Neurology

## 2015-09-06 ENCOUNTER — Telehealth: Payer: Self-pay | Admitting: Family Medicine

## 2015-09-06 NOTE — Telephone Encounter (Signed)
Pt says that Dr. Birdie Riddle advised against the flu shot due to pt's current condition. Pt says that due to [employer] being her employer she have to get the shot unless Dr. Birdie Riddle signs off on an exemption letter. Pt says that this have to be done today.  If it is okay for her to get the shot please advise and I will schedule pt for today with nurse.    Please Advise.     Thanks.

## 2015-09-06 NOTE — Telephone Encounter (Signed)
Now pt is stating that she would like to speak with you directly.     CB#: [phone]

## 2015-09-06 NOTE — Telephone Encounter (Signed)
Spoke with pt and advised that we cannot write a letter for an exemption from the flu shot since we have not written the antibiotic. Pt advised and stated that she received an oral exemption from [employer] to wait to have the flu shot until next week.

## 2015-09-06 NOTE — Telephone Encounter (Signed)
Pt scheduled a flu shot appt on 09/10/15.

## 2015-09-06 NOTE — Telephone Encounter (Signed)
i have no information about her current abx situation.  If pt completed her abx, she can have flu shot.  If she is still on abx, I would recommend waiting until she completes her antibiotics.  If she will finish over the weekend, she can get her shot on Monday.

## 2015-09-10 ENCOUNTER — Ambulatory Visit (INDEPENDENT_AMBULATORY_CARE_PROVIDER_SITE_OTHER): Payer: BLUE CROSS/BLUE SHIELD

## 2015-09-10 DIAGNOSIS — Z23 Encounter for immunization: Secondary | ICD-10-CM

## 2015-09-25 ENCOUNTER — Other Ambulatory Visit: Payer: Self-pay | Admitting: Hematology & Oncology

## 2015-09-25 ENCOUNTER — Encounter: Payer: Self-pay | Admitting: Family Medicine

## 2015-09-26 ENCOUNTER — Encounter: Payer: Self-pay | Admitting: Family Medicine

## 2015-09-27 MED ORDER — DULOXETINE HCL 60 MG PO CPEP: 60.0000 mg | ORAL_CAPSULE | Freq: Every day | ORAL | Status: DC

## 2015-11-04 ENCOUNTER — Other Ambulatory Visit: Payer: Self-pay | Admitting: Family Medicine

## 2015-11-05 NOTE — Telephone Encounter (Signed)
Medication filled to pharmacy as requested.   

## 2015-11-06 ENCOUNTER — Other Ambulatory Visit: Payer: Self-pay | Admitting: Family Medicine

## 2015-11-06 NOTE — Telephone Encounter (Signed)
Medication filled to pharmacy as requested.   

## 2015-11-06 NOTE — Telephone Encounter (Signed)
Last OV 08-23-15 ([facility]) Alprazolam last filled 08-05-15 #60 with 1

## 2015-11-07 ENCOUNTER — Other Ambulatory Visit: Payer: Self-pay | Admitting: Hematology & Oncology

## 2015-11-12 ENCOUNTER — Encounter: Payer: Self-pay | Admitting: Family Medicine

## 2015-11-13 ENCOUNTER — Encounter: Payer: Self-pay | Admitting: Family Medicine

## 2015-11-13 ENCOUNTER — Ambulatory Visit: Payer: Self-pay | Admitting: Physician Assistant

## 2015-11-13 ENCOUNTER — Ambulatory Visit (INDEPENDENT_AMBULATORY_CARE_PROVIDER_SITE_OTHER): Payer: BLUE CROSS/BLUE SHIELD | Admitting: Family Medicine

## 2015-11-13 VITALS — BP 118/78 | HR 94 | Temp 97.9°F | Ht 67.0 in | Wt 200.0 lb

## 2015-11-13 DIAGNOSIS — R928 Other abnormal and inconclusive findings on diagnostic imaging of breast: Secondary | ICD-10-CM

## 2015-11-13 DIAGNOSIS — J0110 Acute frontal sinusitis, unspecified: Secondary | ICD-10-CM | POA: Diagnosis not present

## 2015-11-13 MED ORDER — AMOXICILLIN 875 MG PO TABS: 875.0000 mg | ORAL_TABLET | Freq: Two times a day (BID) | ORAL | Status: DC

## 2015-11-13 NOTE — Patient Instructions (Signed)
Follow up as needed Drink plenty of fluids Start the Amoxicillin twice daily- take w/ food REST! Mucinex DM for cough and chest congestion Call with any questions or concerns Hang in there!!!

## 2015-11-13 NOTE — Progress Notes (Signed)
   Subjective:    Patient ID: Melissa Morgan, female    DOB: 1966/07/12, 50 y.o.   MRN: [id number]  HPI Sore throat- 'i just don't feel good'.  sxs started ~3 days ago.  + sick contacts.  R ear pain, R facial pain.  No fevers.  + dry cough.  Pt reports cervical LAD.  No drainage from the ear.  + nausea, no vomiting.  Denies sinus pain/pressure.   Review of Systems For ROS see HPI     Objective:   Physical Exam  Constitutional: She appears well-developed and well-nourished. No distress.  HENT:  Head: Normocephalic and atraumatic.  Right Ear: Tympanic membrane normal.  Left Ear: Tympanic membrane normal.  Nose: Mucosal edema and rhinorrhea present. Right sinus exhibits frontal sinus tenderness. Right sinus exhibits no maxillary sinus tenderness. Left sinus exhibits frontal sinus tenderness. Left sinus exhibits no maxillary sinus tenderness.  Mouth/Throat: Uvula is midline and mucous membranes are normal. Posterior oropharyngeal erythema present. No oropharyngeal exudate.  Eyes: Conjunctivae and EOM are normal. Pupils are equal, round, and reactive to light.  Neck: Normal range of motion. Neck supple.  Cardiovascular: Normal rate, regular rhythm and normal heart sounds.   Pulmonary/Chest: Effort normal and breath sounds normal. No respiratory distress. She has no wheezes.  Lymphadenopathy:    She has no cervical adenopathy.  Vitals reviewed.         Assessment & Plan:

## 2015-11-13 NOTE — Progress Notes (Signed)
Pre visit review using our clinic review tool, if applicable. No additional management support is needed unless otherwise documented below in the visit note. 

## 2015-11-14 NOTE — Assessment & Plan Note (Signed)
New.  Pt's sxs and PE consistent w/ early sinusitis.  Start abx.  Reviewed supportive care and red flags that should prompt return.  Pt expressed understanding and is in agreement w/ plan.

## 2015-11-26 ENCOUNTER — Ambulatory Visit (HOSPITAL_BASED_OUTPATIENT_CLINIC_OR_DEPARTMENT_OTHER): Payer: BLUE CROSS/BLUE SHIELD | Admitting: Hematology & Oncology

## 2015-11-26 ENCOUNTER — Encounter: Payer: Self-pay | Admitting: Hematology & Oncology

## 2015-11-26 ENCOUNTER — Other Ambulatory Visit (HOSPITAL_BASED_OUTPATIENT_CLINIC_OR_DEPARTMENT_OTHER): Payer: BLUE CROSS/BLUE SHIELD

## 2015-11-26 VITALS — BP 103/74 | HR 70 | Temp 97.4°F | Resp 16 | Ht 67.0 in | Wt 198.0 lb

## 2015-11-26 DIAGNOSIS — I82491 Acute embolism and thrombosis of other specified deep vein of right lower extremity: Secondary | ICD-10-CM | POA: Diagnosis not present

## 2015-11-26 DIAGNOSIS — I82409 Acute embolism and thrombosis of unspecified deep veins of unspecified lower extremity: Secondary | ICD-10-CM

## 2015-11-26 DIAGNOSIS — I82622 Acute embolism and thrombosis of deep veins of left upper extremity: Secondary | ICD-10-CM

## 2015-11-26 DIAGNOSIS — I82402 Acute embolism and thrombosis of unspecified deep veins of left lower extremity: Secondary | ICD-10-CM

## 2015-11-26 LAB — CBC WITH DIFFERENTIAL (CANCER CENTER ONLY)
BASO#: 0 10*3/uL (ref 0.0–0.2)
BASO%: 0.5 % (ref 0.0–2.0)
EOS ABS: 0.2 10*3/uL (ref 0.0–0.5)
EOS%: 3 % (ref 0.0–7.0)
HCT: 38.5 % (ref 34.8–46.6)
HEMOGLOBIN: 12.7 g/dL (ref 11.6–15.9)
LYMPH#: 3.1 10*3/uL (ref 0.9–3.3)
LYMPH%: 45.9 % (ref 14.0–48.0)
MCH: 30 pg (ref 26.0–34.0)
MCHC: 33 g/dL (ref 32.0–36.0)
MCV: 91 fL (ref 81–101)
MONO#: 0.6 10*3/uL (ref 0.1–0.9)
MONO%: 8.6 % (ref 0.0–13.0)
NEUT%: 42 % (ref 39.6–80.0)
NEUTROS ABS: 2.8 10*3/uL (ref 1.5–6.5)
PLATELETS: 243 10*3/uL (ref 145–400)
RBC: 4.23 10*6/uL (ref 3.70–5.32)
RDW: 13.8 % (ref 11.1–15.7)
WBC: 6.7 10*3/uL (ref 3.9–10.0)

## 2015-11-26 LAB — COMPREHENSIVE METABOLIC PANEL
ALBUMIN: 3.9 g/dL (ref 3.5–5.0)
ALK PHOS: 72 U/L (ref 40–150)
ALT: 17 U/L (ref 0–55)
AST: 18 U/L (ref 5–34)
Anion Gap: 7 mEq/L (ref 3–11)
BUN: 12.9 mg/dL (ref 7.0–26.0)
CO2: 21 meq/L — AB (ref 22–29)
Calcium: 8.9 mg/dL (ref 8.4–10.4)
Chloride: 112 mEq/L — ABNORMAL HIGH (ref 98–109)
Creatinine: 0.9 mg/dL (ref 0.6–1.1)
EGFR: 80 mL/min/{1.73_m2} — ABNORMAL LOW (ref >90)
GLUCOSE: 114 mg/dL (ref 70–140)
Potassium: 3.8 mEq/L (ref 3.5–5.1)
SODIUM: 140 meq/L (ref 136–145)
TOTAL PROTEIN: 7.2 g/dL (ref 6.4–8.3)

## 2015-11-26 MED ORDER — RIVAROXABAN 15 MG PO TABS: ORAL_TABLET | ORAL | Status: DC

## 2015-11-26 NOTE — Progress Notes (Signed)
Hematology and Oncology Follow Up Visit  Melissa Morgan [id number] [birth year] 50 y.o. 11/26/2015   Principle Diagnosis:  Thrombus in the right peroneal vein   Current Therapy:   Xarelto 7.5mg  po q day    Interim History:  [name] is back for follow-up. She had a decent Christmas holiday. I foresee, she has a headache today. She has headaches on occasion.  She is on Xarelto. She takes 7.5 mg a day. She does not want to get off the Xarelto. I understand her concerns.  She's working. She is doing okay with work.  She is worried about her daughter. Her daughter's father is not doing too well as he has leukemia.  She's had no bleeding.  She did have a viral syndrome back in December. She get over this.  She's had no rashes.  She's had no visual issues.  She's had no problems with bowels or bladder.  Overall, her performance status is ECOG 1.   Medications:  Current outpatient prescriptions:  .  ALPRAZolam (XANAX) 0.5 MG tablet, TAKE 1 TABLET BY MOUTH TWICE DAILY AS NEEDED FOR ANXIETY, Disp: 60 tablet, Rfl: 0 .  DULoxetine (CYMBALTA) 60 MG capsule, Take 1 capsule (60 mg total) by mouth daily., Disp: 30 capsule, Rfl: 6 .  hydrOXYzine (ATARAX/VISTARIL) 10 MG tablet, 10-20 mg every 8 hours as needed for severe headache, Disp: , Rfl:  .  metoprolol succinate (TOPROL-XL) 25 MG 24 hr tablet, Take 1 tablet (25 mg total) by mouth daily., Disp: 30 tablet, Rfl: 6 .  pantoprazole (PROTONIX) 40 MG tablet, TAKE 1 TABLET(40 MG) BY MOUTH DAILY, Disp: 30 tablet, Rfl: 6 .  promethazine (PHENERGAN) 12.5 MG tablet, Take 1 tablet (12.5 mg total) by mouth every 6 (six) hours as needed., Disp: 30 tablet, Rfl: 1 .  Rivaroxaban (XARELTO) 15 MG TABS tablet, Take 1/2 pill a day., Disp: 30 tablet, Rfl: 0 .  topiramate (TOPAMAX) 25 MG tablet, Take 25 mg by mouth. Take 1 /day for 2 weeks, increase by 1 tab every 2 weeks until 4/day., Disp: , Rfl:  .  traZODone (DESYREL) 50 MG tablet, TAKE 1/2 TO 1 TABLET  BY MOUTH AT BEDTIME AS NEEDED FOR SLEEP (Patient not taking: Reported on 11/13/2015), Disp: 30 tablet, Rfl: 3  Allergies:  Allergies  Allergen Reactions  . Imitrex [Sumatriptan] Other (See Comments)    Increase heart rate and chest pain   . Dilaudid [Hydromorphone Hcl] Nausea And Vomiting  . Ace Inhibitors Other (See Comments)    REACTION: unknown  . Ondansetron Other (See Comments)    Constipation and depression  . Tramadol Other (See Comments)    Palpitations; Dizziness  . Codeine Nausea Only  . Latex Itching    Past Medical History, Surgical history, Social history, and Family History were reviewed and updated.  Review of Systems: As above  Physical Exam:  height is 5\' 7"  (1.702 m) and weight is 198 lb (89.812 kg). Her oral temperature is 97.4 F (36.3 C). Her blood pressure is 103/74 and her pulse is 70. Her respiration is 16.   Well-developed and well-nourished white female. Head and neck exam shows no ocular or oral lesions. She has no palpable cervical or supraclavicular lymph nodes. Lungs are clear. Cardiac exam regular rate and rhythm with no murmurs, rubs or bruits. Abdomen is soft. She has good bowel sounds. There is no fluid wave. There is no palpable liver or spleen tip. Back exam shows no tenderness over the spine, ribs or  hips. Extremities shows no clubbing, cyanosis or edema. No swelling is noted in the legs. There is no venous cord in the right leg. She has good range of motion of her joints. Skin exam no rashes, ecchymoses or petechia. Neurological exam is nonfocal.  Lab Results  Component Value Date   WBC 6.7 11/26/2015   HGB 12.7 11/26/2015   HCT 38.5 11/26/2015   MCV 91 11/26/2015   PLT 243 11/26/2015     Chemistry      Component Value Date/Time   NA 141 08/05/2015 1006   NA 138 07/22/2015 1945   NA 144 12/31/2014 1329   K 3.9 08/05/2015 1006   K 3.6 07/22/2015 1945   K 3.8 12/31/2014 1329   CL 103 07/22/2015 1945   CL 100 12/31/2014 1329   CO2 26  08/05/2015 1006   CO2 27 07/22/2015 1945   CO2 30 12/31/2014 1329   BUN 11.1 08/05/2015 1006   BUN 15 07/22/2015 1945   BUN 11 12/31/2014 1329   CREATININE 0.9 08/05/2015 1006   CREATININE 0.82 07/22/2015 1945   CREATININE 1.0 12/31/2014 1329      Component Value Date/Time   CALCIUM 9.4 08/05/2015 1006   CALCIUM 9.2 07/22/2015 1945   CALCIUM 9.3 12/31/2014 1329   ALKPHOS 87 08/05/2015 1006   ALKPHOS 77 07/22/2015 1945   ALKPHOS 59 12/31/2014 1329   AST 23 08/05/2015 1006   AST 34 07/22/2015 1945   AST 24 12/31/2014 1329   ALT 26 08/05/2015 1006   ALT 39 07/22/2015 1945   ALT 22 12/31/2014 1329   BILITOT 0.52 08/05/2015 1006   BILITOT 0.6 07/22/2015 1945   BILITOT 0.70 12/31/2014 1329         Impression and Plan: Melissa Morgan is 50 year old white female with a post surgical thrombus in the right leg. This has resolved.   She is doing well with low-dose Xarelto. I really think this is appropriate for her.  I don't see any need for any studies right now.  We will plan to get her back in 4 months. We see her back, she will be 50 years old.     Volanda Napoleon, MD 1/17/20179:21 AM

## 2015-12-13 ENCOUNTER — Encounter: Payer: Self-pay | Admitting: Family Medicine

## 2015-12-15 ENCOUNTER — Other Ambulatory Visit: Payer: Self-pay | Admitting: Family Medicine

## 2015-12-16 ENCOUNTER — Other Ambulatory Visit: Payer: Self-pay | Admitting: Family Medicine

## 2015-12-16 MED ORDER — ALPRAZOLAM 0.5 MG PO TABS: 0.5000 mg | ORAL_TABLET | Freq: Two times a day (BID) | ORAL | Status: DC | PRN

## 2015-12-16 NOTE — Telephone Encounter (Signed)
Pt called stating that she is now out of xanax and is feeling it. Please send in for her.

## 2015-12-16 NOTE — Telephone Encounter (Addendum)
Medications faxed to pharmacy

## 2015-12-16 NOTE — Telephone Encounter (Signed)
Last OV 11/13/15 Sinus Alprazolam last filled 11/06/15 #60 with 0

## 2015-12-17 NOTE — Telephone Encounter (Signed)
Medication filled to pharmacy as requested.   

## 2015-12-17 NOTE — Telephone Encounter (Signed)
Med denied, faxed to pharmacy yesterday and confirmation received.

## 2015-12-19 ENCOUNTER — Other Ambulatory Visit: Payer: Self-pay | Admitting: Family Medicine

## 2015-12-19 DIAGNOSIS — N644 Mastodynia: Secondary | ICD-10-CM

## 2015-12-19 DIAGNOSIS — L299 Pruritus, unspecified: Secondary | ICD-10-CM

## 2015-12-27 ENCOUNTER — Encounter: Payer: Self-pay | Admitting: Family Medicine

## 2015-12-30 ENCOUNTER — Other Ambulatory Visit: Payer: Self-pay | Admitting: Family Medicine

## 2015-12-30 NOTE — Telephone Encounter (Signed)
Medication filled to pharmacy as requested.   

## 2016-01-02 ENCOUNTER — Ambulatory Visit (INDEPENDENT_AMBULATORY_CARE_PROVIDER_SITE_OTHER): Payer: BLUE CROSS/BLUE SHIELD | Admitting: Family Medicine

## 2016-01-02 VITALS — BP 105/75 | HR 72 | Temp 98.2°F | Ht 67.0 in | Wt 197.6 lb

## 2016-01-02 DIAGNOSIS — J209 Acute bronchitis, unspecified: Secondary | ICD-10-CM | POA: Diagnosis not present

## 2016-01-02 MED ORDER — DOXYCYCLINE HYCLATE 100 MG PO CAPS: 100.0000 mg | ORAL_CAPSULE | Freq: Two times a day (BID) | ORAL | Status: DC

## 2016-01-02 NOTE — Patient Instructions (Signed)
Take the doxycycline if you do not get better.   You would take this twice a day for 10 days Let me know if you are not feeling better soon!

## 2016-01-02 NOTE — Progress Notes (Signed)
Pre visit review using our clinic review tool, if applicable. No additional management support is needed unless otherwise documented below in the visit note. 

## 2016-01-02 NOTE — Progress Notes (Signed)
[place] at [facility] [address], [place], [place] [address] [phone]  Date:  01/02/2016   Name:  Melissa Morgan   DOB:  03-Jul-1966   MRN:  [id number]  PCP:  Annye Asa, MD    Chief Complaint: Cough and Fatigue   History of Present Illness:  Melissa Morgan is a 50 y.o. very pleasant female patient who presents with the following:  Here today for a sick visit- she has a history of CVA, GERD, craniotomy in 2015 for a brain aneurysm. She is back at work now following her recent health concerns.  Admits that she is still pretty anxious and that she gets upset more easily due to what she has been through.   She has not felt well for a couple of weeks- her sx are coughing up green phlegm, cough will wax and wane.  She has also has laryngitis Cough is worse at night and in the am Her daughter's RT has checked her lungs and they have sounded clear. She has not noted any fever.  Does not have URI sx- her sx are mostly in her chest She is not having a ST or runny nose.    She also noted a "shocking feeling" in her left arm just now when we took her BP in that arm.  This sx is now resolved but she is afraid this means she may have developed ?a bleed or clot related to her use of xarelto  She is not SA and is menopausal in any case  BP Readings from Last 3 Encounters:  01/02/16 98/70  11/26/15 103/74  11/13/15 118/78     Patient Active Problem List   Diagnosis Date Noted  . Acute frontal sinusitis 11/13/2015  . Chronic daily headache 04/14/2015  . Aneurysm, cerebral, nonruptured 02/12/2015  . Worsening headaches 02/12/2015  . Breast pain, right 12/19/2014  . Polyarthralgia 12/19/2014  . Chest pain 10/01/2014  . Nausea without vomiting 10/01/2014  . GERD (gastroesophageal reflux disease) 07/30/2014  . DVT (deep venous thrombosis) ([place]) 07/23/2014  . Cerebral aneurysm without rupture 07/06/2014  . Depression with  anxiety 03/23/2014    Past Medical History  Diagnosis Date  . Depression   . Tachycardia   . Anxiety   . Lacunar infarction ([place])   . Endometriosis   . Headache   . PONV (postoperative nausea and vomiting)     extremely sick  . Stroke ([place])     cva  14  . GERD (gastroesophageal reflux disease)     occ  . Seizures ([place])     tx 5 yrs in middle school  continuous  . Arthritis   . Brain aneurysm   . DVT (deep venous thrombosis) [facility])     Past Surgical History  Procedure Laterality Date  . Lasar laparoscopy      for endometriosis x3  . Craniotomy Right 07/06/2014    Procedure: CRANIOTOMY INTRACRANIAL ANEURYSM FOR CAROTID;  Surgeon: Ashok Pall, MD;  Location: [facility];  Service: Neurosurgery;  Laterality: Right;  right     Social History  Substance Use Topics  . Smoking status: Former Smoker -- 0.00 packs/day for 30 years    Start date: 12/28/1983    Quit date: 05/26/2014  . Smokeless tobacco: Never Used     Comment: quit smoking 3 months ago  . Alcohol Use: No    Family History  Problem Relation Age of Onset  .  Hypertension Brother     2 out of 3 brothers  . Heart attack Brother     2 out of 3 brothers  . Diabetes Brother   . Asthma Sister   . Hypertension Mother   . Heart attack Father   . Breast cancer Sister     Allergies  Allergen Reactions  . Imitrex [Sumatriptan] Other (See Comments)    Increase heart rate and chest pain   . Dilaudid [Hydromorphone Hcl] Nausea And Vomiting  . Ace Inhibitors Other (See Comments)    REACTION: unknown  . Ondansetron Other (See Comments)    Constipation and depression  . Tramadol Other (See Comments)    Palpitations; Dizziness  . Codeine Nausea Only  . Latex Itching    Medication list has been reviewed and updated.  Current Outpatient Prescriptions on File Prior to Visit  Medication Sig Dispense Refill  . ALPRAZolam (XANAX) 0.5 MG tablet Take 1 tablet (0.5 mg total) by mouth 2 (two) times daily as needed.  for anxiety 60 tablet 0  . DULoxetine (CYMBALTA) 60 MG capsule Take 1 capsule (60 mg total) by mouth daily. 30 capsule 6  . hydrOXYzine (ATARAX/VISTARIL) 10 MG tablet 10-20 mg every 8 hours as needed for severe headache    . metoprolol succinate (TOPROL-XL) 25 MG 24 hr tablet TAKE 1 TABLET(25 MG) BY MOUTH DAILY 30 tablet 6  . pantoprazole (PROTONIX) 40 MG tablet TAKE 1 TABLET(40 MG) BY MOUTH DAILY 30 tablet 6  . promethazine (PHENERGAN) 12.5 MG tablet Take 1 tablet (12.5 mg total) by mouth every 6 (six) hours as needed. 30 tablet 1  . Rivaroxaban (XARELTO) 15 MG TABS tablet Take 1/2 pill a day. 30 tablet 0  . topiramate (TOPAMAX) 25 MG tablet Take 25 mg by mouth. Take 1 /day for 2 weeks, increase by 1 tab every 2 weeks until 4/day.    . traZODone (DESYREL) 50 MG tablet TAKE 1/2 TO 1 TABLET BY MOUTH AT BEDTIME AS NEEDED FOR SLEEP (Patient not taking: Reported on 11/13/2015) 30 tablet 3   No current facility-administered medications on file prior to visit.    Review of Systems:  As per HPI- otherwise negative.   Physical Examination: Filed Vitals:   01/02/16 1530  BP: 98/70  Pulse: 72  Temp: 98.2 F (36.8 C)   Filed Vitals:   01/02/16 1530  Height: 5\' 7"  (1.702 m)  Weight: 197 lb 9.6 oz (89.631 kg)   Body mass index is 30.94 kg/(m^2). Ideal Body Weight: Weight in (lb) to have BMI = 25: 159.3  GEN: WDWN, NAD, Non-toxic, A & O x 3, overweight, looks well HEENT: Atraumatic, Normocephalic. Neck supple. No masses, No LAD.  Bilateral TM wnl, oropharynx normal.  PEERL,EOMI.   Ears and Nose: No external deformity. CV: RRR, No M/G/R. No JVD. No thrill. No extra heart sounds. PULM: CTA B, no wheezes, crackles, rhonchi. No retractions. No resp. distress. No accessory muscle use. EXTR: No c/c/e NEURO Normal gait.  PSYCH: Normally interactive. Conversant. Not depressed or anxious appearing.  Calm demeanor.  She has normal strength and perfusion of both arms.  Sx are resolved  Assessment  and Plan: Acute bronchitis, unspecified organism - Plan: doxycycline (VIBRAMYCIN) 100 MG capsule  Here today with a cough productive of discolored phlegm.  Discussed with her in detail.  She does not want to take an abx unless absolutely necessary, and does not wish to have a chest x-ray or labs today.  She was worried and just  wanted to be checked out.  In the end we decided to have her hold onto an rx for doxycycline and use if she does not improve soon.  She will let me know if she has any other concerns   Signed Lamar Blinks, MD

## 2016-01-05 ENCOUNTER — Encounter: Payer: Self-pay | Admitting: Family Medicine

## 2016-01-13 ENCOUNTER — Ambulatory Visit
Admission: RE | Admit: 2016-01-13 | Discharge: 2016-01-13 | Disposition: A | Discharge status: Home / Self Care | Payer: BLUE CROSS/BLUE SHIELD | Source: Ambulatory Visit | Attending: Family Medicine | Admitting: Family Medicine

## 2016-01-13 DIAGNOSIS — L299 Pruritus, unspecified: Secondary | ICD-10-CM

## 2016-01-13 DIAGNOSIS — N644 Mastodynia: Secondary | ICD-10-CM

## 2016-01-20 ENCOUNTER — Telehealth: Payer: Self-pay | Admitting: Family Medicine

## 2016-01-20 NOTE — Telephone Encounter (Signed)
Pharmacy: [facility] [id number] - [place], [place] - [address] AT [address]  Reason for call: pt dtr was diagnonsed with the flu today and she has been with her for several days. She is wanting [place] to send in RX for med since she was exposed.

## 2016-01-21 MED ORDER — OSELTAMIVIR PHOSPHATE 75 MG PO CAPS
75.0000 mg | ORAL_CAPSULE | Freq: Every day | ORAL | Status: AC
Start: 2016-01-21 — End: 2016-02-04

## 2016-01-21 NOTE — Telephone Encounter (Signed)
[place] for Tamiflu 75mg  daily x10 days

## 2016-01-21 NOTE — Telephone Encounter (Signed)
Medication filled to pharmacy as requested.  Pt informed.   

## 2016-01-31 ENCOUNTER — Encounter: Payer: Self-pay | Admitting: Family Medicine

## 2016-01-31 ENCOUNTER — Other Ambulatory Visit: Payer: Self-pay | Admitting: Family Medicine

## 2016-01-31 MED ORDER — ALPRAZOLAM 0.5 MG PO TABS: 0.5000 mg | ORAL_TABLET | Freq: Two times a day (BID) | ORAL | Status: DC | PRN

## 2016-01-31 NOTE — Telephone Encounter (Signed)
Last OV 11/13/15 (sinus) Alprazolam last filled 12/16/15 #60 with 0

## 2016-01-31 NOTE — Telephone Encounter (Signed)
Medication filled to pharmacy as requested.   

## 2016-02-26 ENCOUNTER — Encounter: Payer: Self-pay | Admitting: Family Medicine

## 2016-03-02 ENCOUNTER — Ambulatory Visit (INDEPENDENT_AMBULATORY_CARE_PROVIDER_SITE_OTHER): Payer: BLUE CROSS/BLUE SHIELD | Admitting: Family Medicine

## 2016-03-02 ENCOUNTER — Encounter: Payer: Self-pay | Admitting: Family Medicine

## 2016-03-02 VITALS — BP 124/82 | HR 80 | Temp 98.0°F | Resp 16 | Wt 198.1 lb

## 2016-03-02 DIAGNOSIS — R195 Other fecal abnormalities: Secondary | ICD-10-CM | POA: Insufficient documentation

## 2016-03-02 DIAGNOSIS — R109 Unspecified abdominal pain: Secondary | ICD-10-CM | POA: Diagnosis not present

## 2016-03-02 DIAGNOSIS — R1084 Generalized abdominal pain: Secondary | ICD-10-CM

## 2016-03-02 LAB — BASIC METABOLIC PANEL
BUN: 13 mg/dL (ref 6–23)
CHLORIDE: 106 meq/L (ref 96–112)
CO2: 24 meq/L (ref 19–32)
CREATININE: 0.75 mg/dL (ref 0.40–1.20)
Calcium: 9.5 mg/dL (ref 8.4–10.5)
GFR: 86.92 mL/min (ref >60.00)
Glucose, Bld: 111 mg/dL — ABNORMAL HIGH (ref 70–99)
Potassium: 3.8 mEq/L (ref 3.5–5.1)
Sodium: 138 mEq/L (ref 135–145)

## 2016-03-02 LAB — CBC WITH DIFFERENTIAL/PLATELET
BASOS ABS: 0 10*3/uL (ref 0.0–0.1)
Basophils Relative: 0.7 % (ref 0.0–3.0)
EOS PCT: 2.5 % (ref 0.0–5.0)
Eosinophils Absolute: 0.1 10*3/uL (ref 0.0–0.7)
HEMATOCRIT: 38.3 % (ref 36.0–46.0)
Hemoglobin: 13 g/dL (ref 12.0–15.0)
LYMPHS ABS: 2.9 10*3/uL (ref 0.7–4.0)
Lymphocytes Relative: 48.5 % — ABNORMAL HIGH (ref 12.0–46.0)
MCHC: 34 g/dL (ref 30.0–36.0)
MCV: 87.7 fl (ref 78.0–100.0)
MONOS PCT: 7.7 % (ref 3.0–12.0)
Monocytes Absolute: 0.4 10*3/uL (ref 0.1–1.0)
NEUTROS ABS: 2.4 10*3/uL (ref 1.4–7.7)
Neutrophils Relative %: 40.6 % — ABNORMAL LOW (ref 43.0–77.0)
PLATELETS: 227 10*3/uL (ref 150.0–400.0)
RBC: 4.37 Mil/uL (ref 3.87–5.11)
RDW: 14.4 % (ref 11.5–15.5)
WBC: 5.9 10*3/uL (ref 4.0–10.5)

## 2016-03-02 LAB — HEPATIC FUNCTION PANEL
ALBUMIN: 4.4 g/dL (ref 3.5–5.2)
ALT: 17 U/L (ref 0–35)
AST: 21 U/L (ref 0–37)
Alkaline Phosphatase: 74 U/L (ref 39–117)
Bilirubin, Direct: 0.1 mg/dL (ref 0.0–0.3)
Total Bilirubin: 0.5 mg/dL (ref 0.2–1.2)
Total Protein: 7.3 g/dL (ref 6.0–8.3)

## 2016-03-02 LAB — TSH: TSH: 0.68 u[IU]/mL (ref 0.35–4.50)

## 2016-03-02 MED ORDER — DICYCLOMINE HCL 20 MG PO TABS: 20.0000 mg | ORAL_TABLET | Freq: Three times a day (TID) | ORAL | Status: DC | PRN

## 2016-03-02 NOTE — Progress Notes (Signed)
Pre visit review using our clinic review tool, if applicable. No additional management support is needed unless otherwise documented below in the visit note. 

## 2016-03-02 NOTE — Patient Instructions (Signed)
Follow up as needed We'll notify you of your lab results and make any changes if needed Start the Bentyl as needed for abdominal pain/spasm Drink plenty of fluids Immodium as needed Call with any questions or concerns Hang in there!!!

## 2016-03-02 NOTE — Progress Notes (Signed)
   Subjective:    Patient ID: Melissa Morgan, female    DOB: [birth year], 50 y.o.   MRN: [id number]  HPI Loose stools- pt reports she has had sxs for 'almost 2 months'.  Pt reports diffuse abd pain w/ loose stools and 'no consistent pattern'.  At times stools will be watery.  Has hx of constipation prior to these sxs.  Pt reports almost daily sxs.  + nausea but no vomiting.  No one w/ similar sxs.  Pt states sxs are not food related.  Having occasional GERD- not currently on any medication.  Denies increased stress.   Review of Systems For ROS see HPI     Objective:   Physical Exam  Constitutional: She is oriented to person, place, and time. She appears well-developed and well-nourished. No distress.  HENT:  Head: Normocephalic and atraumatic.  MMM  Neck: Neck supple.  Cardiovascular: Normal rate, regular rhythm and intact distal pulses.   Pulmonary/Chest: Effort normal and breath sounds normal. No respiratory distress. She has no wheezes. She has no rales.  Abdominal: Soft. She exhibits no distension. There is no tenderness. There is no rebound.  Hyperactive BS  Lymphadenopathy:    She has no cervical adenopathy.  Neurological: She is alert and oriented to person, place, and time.  Skin: Skin is warm and dry.  Vitals reviewed.         Assessment & Plan:

## 2016-03-03 NOTE — Assessment & Plan Note (Signed)
New.  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  Check labs to r/o infxn or other underlying cause.  Start Bentyl prn.  Reviewed supportive care and red flags that should prompt return.  Pt expressed understanding and is in agreement w/ plan.

## 2016-03-03 NOTE — Assessment & Plan Note (Signed)
Pt's abd pain sounds consistent w/ possible IBS.  Pt has hx of anxiety and this may be a physical manifestation.  Check labs to r/o other underlying causes.  Start bentyl prn.  If no improvement will need GI referral.  Pt expressed understanding and is in agreement w/ plan.

## 2016-03-12 ENCOUNTER — Other Ambulatory Visit: Payer: Self-pay | Admitting: Family Medicine

## 2016-03-12 ENCOUNTER — Other Ambulatory Visit (INDEPENDENT_AMBULATORY_CARE_PROVIDER_SITE_OTHER): Payer: BLUE CROSS/BLUE SHIELD

## 2016-03-12 ENCOUNTER — Encounter: Payer: Self-pay | Admitting: Family Medicine

## 2016-03-12 ENCOUNTER — Telehealth: Payer: Self-pay | Admitting: Family Medicine

## 2016-03-12 DIAGNOSIS — R195 Other fecal abnormalities: Secondary | ICD-10-CM

## 2016-03-12 DIAGNOSIS — R1084 Generalized abdominal pain: Secondary | ICD-10-CM

## 2016-03-12 LAB — C. DIFFICILE GDH AND TOXIN A/B
C. difficile GDH: NOT DETECTED
C. difficile Toxin A/B: NOT DETECTED

## 2016-03-12 MED ORDER — ALPRAZOLAM 0.5 MG PO TABS: 0.5000 mg | ORAL_TABLET | Freq: Two times a day (BID) | ORAL | Status: DC | PRN

## 2016-03-12 NOTE — Telephone Encounter (Signed)
Pt states that she is still having stomach pains, pt has seen her OB and was told that they were having trouble see one of her ovaries due to her bowels coving it, pt is really concerned and would like a call back to discuss this.

## 2016-03-12 NOTE — Telephone Encounter (Signed)
Referral placed.

## 2016-03-12 NOTE — Telephone Encounter (Signed)
The overlying bowels is nothing to worry about- that's where they belong.  If still having abdominal pain we can refer to GI

## 2016-03-13 ENCOUNTER — Encounter: Payer: Self-pay | Admitting: Gastroenterology

## 2016-03-24 ENCOUNTER — Encounter: Payer: Self-pay | Admitting: Hematology & Oncology

## 2016-03-24 ENCOUNTER — Other Ambulatory Visit: Payer: BLUE CROSS/BLUE SHIELD

## 2016-03-24 ENCOUNTER — Other Ambulatory Visit (HOSPITAL_BASED_OUTPATIENT_CLINIC_OR_DEPARTMENT_OTHER): Payer: BLUE CROSS/BLUE SHIELD

## 2016-03-24 ENCOUNTER — Ambulatory Visit: Payer: BLUE CROSS/BLUE SHIELD | Admitting: Hematology & Oncology

## 2016-03-24 ENCOUNTER — Ambulatory Visit (HOSPITAL_BASED_OUTPATIENT_CLINIC_OR_DEPARTMENT_OTHER): Payer: BLUE CROSS/BLUE SHIELD | Admitting: Hematology & Oncology

## 2016-03-24 VITALS — BP 103/69 | HR 69 | Temp 98.2°F | Resp 18 | Ht 67.0 in | Wt 195.0 lb

## 2016-03-24 DIAGNOSIS — I82491 Acute embolism and thrombosis of other specified deep vein of right lower extremity: Secondary | ICD-10-CM | POA: Diagnosis not present

## 2016-03-24 DIAGNOSIS — I82622 Acute embolism and thrombosis of deep veins of left upper extremity: Secondary | ICD-10-CM

## 2016-03-24 DIAGNOSIS — I82409 Acute embolism and thrombosis of unspecified deep veins of unspecified lower extremity: Secondary | ICD-10-CM

## 2016-03-24 LAB — CBC WITH DIFFERENTIAL (CANCER CENTER ONLY)
BASO#: 0 10*3/uL (ref 0.0–0.2)
BASO%: 0.6 % (ref 0.0–2.0)
EOS ABS: 0.2 10*3/uL (ref 0.0–0.5)
EOS%: 3.4 % (ref 0.0–7.0)
HCT: 39.1 % (ref 34.8–46.6)
HGB: 13.3 g/dL (ref 11.6–15.9)
LYMPH#: 3 10*3/uL (ref 0.9–3.3)
LYMPH%: 43.9 % (ref 14.0–48.0)
MCH: 30.6 pg (ref 26.0–34.0)
MCHC: 34 g/dL (ref 32.0–36.0)
MCV: 90 fL (ref 81–101)
MONO#: 0.6 10*3/uL (ref 0.1–0.9)
MONO%: 8 % (ref 0.0–13.0)
NEUT#: 3 10*3/uL (ref 1.5–6.5)
NEUT%: 44.1 % (ref 39.6–80.0)
PLATELETS: 232 10*3/uL (ref 145–400)
RBC: 4.35 10*6/uL (ref 3.70–5.32)
RDW: 14.1 % (ref 11.1–15.7)
WBC: 6.9 10*3/uL (ref 3.9–10.0)

## 2016-03-24 LAB — COMPREHENSIVE METABOLIC PANEL
ALK PHOS: 78 U/L (ref 40–150)
ALT: 19 U/L (ref 0–55)
ANION GAP: 7 meq/L (ref 3–11)
AST: 20 U/L (ref 5–34)
Albumin: 4.1 g/dL (ref 3.5–5.0)
BILIRUBIN TOTAL: 0.3 mg/dL (ref 0.20–1.20)
BUN: 13.8 mg/dL (ref 7.0–26.0)
CALCIUM: 9.4 mg/dL (ref 8.4–10.4)
CHLORIDE: 109 meq/L (ref 98–109)
CO2: 24 mEq/L (ref 22–29)
Creatinine: 0.9 mg/dL (ref 0.6–1.1)
EGFR: 80 mL/min/{1.73_m2} — ABNORMAL LOW (ref >90)
Glucose: 117 mg/dl (ref 70–140)
POTASSIUM: 3.7 meq/L (ref 3.5–5.1)
Sodium: 140 mEq/L (ref 136–145)
Total Protein: 7.3 g/dL (ref 6.4–8.3)

## 2016-03-24 LAB — LACTATE DEHYDROGENASE: LDH: 160 U/L (ref 125–245)

## 2016-03-24 NOTE — Progress Notes (Signed)
Hematology and Oncology Follow Up Visit  Melissa Morgan [id number] [birth year] 50 y.o. 03/24/2016   Principle Diagnosis:  Thrombus in the right peroneal vein   Current Therapy:   Xarelto 7.5mg  po q day    Interim History:  [name] is back for follow-up. We last saw her back in January. Since then, she been doing okay. I think she is working.  She is on 7.5 mg Xarelto. She's doing well with this.  There has been no leg pain. She had no leg swelling.  She does complain of diarrhea. She has abdominal discomfort. She was worked up for C. difficile. She's not sure as to why her doctor is tested for this. I think his been negative so far. She's not been tested for Helicobacter.  She does have an appointment with gastroneurology in a few more weeks.   She's had no headache. She's had no bleeding. She's had no fever. She's had no cough or shortness of breath. She's had no problems with infections.   Overall, her performance status is ECOG 1.   Medications:  Current outpatient prescriptions:  .  ALPRAZolam (XANAX) 0.5 MG tablet, Take 1 tablet (0.5 mg total) by mouth 2 (two) times daily as needed. for anxiety, Disp: 60 tablet, Rfl: 0 .  dicyclomine (BENTYL) 20 MG tablet, Take 1 tablet (20 mg total) by mouth 3 (three) times daily as needed for spasms., Disp: 60 tablet, Rfl: 1 .  DULoxetine (CYMBALTA) 60 MG capsule, Take 1 capsule (60 mg total) by mouth daily., Disp: 30 capsule, Rfl: 6 .  metoprolol succinate (TOPROL-XL) 25 MG 24 hr tablet, TAKE 1 TABLET(25 MG) BY MOUTH DAILY, Disp: 30 tablet, Rfl: 6 .  pantoprazole (PROTONIX) 40 MG tablet, TAKE 1 TABLET(40 MG) BY MOUTH DAILY, Disp: 30 tablet, Rfl: 6 .  promethazine (PHENERGAN) 12.5 MG tablet, Take 1 tablet (12.5 mg total) by mouth every 6 (six) hours as needed., Disp: 30 tablet, Rfl: 1 .  Rivaroxaban (XARELTO) 15 MG TABS tablet, Take 1/2 pill a day., Disp: 30 tablet, Rfl: 0 .  topiramate (TOPAMAX) 25 MG tablet, Take 25 mg by mouth. Take 1  /day for 2 weeks, increase by 1 tab every 2 weeks until 4/day., Disp: , Rfl:   Allergies:  Allergies  Allergen Reactions  . Imitrex [Sumatriptan] Other (See Comments)    Increase heart rate and chest pain   . Dilaudid [Hydromorphone Hcl] Nausea And Vomiting  . Ace Inhibitors Other (See Comments)    REACTION: unknown  . Ondansetron Other (See Comments)    Constipation and depression  . Tramadol Other (See Comments)    Palpitations; Dizziness  . Codeine Nausea Only  . Latex Itching    Past Medical History, Surgical history, Social history, and Family History were reviewed and updated.  Review of Systems: As above  Physical Exam:  height is 5\' 7"  (1.702 m) and weight is 195 lb (88.451 kg). Her oral temperature is 98.2 F (36.8 C). Her blood pressure is 103/69 and her pulse is 69. Her respiration is 18.   Well-developed and well-nourished white female. Head and neck exam shows no ocular or oral lesions. She has no palpable cervical or supraclavicular lymph nodes. Lungs are clear. Cardiac exam regular rate and rhythm with no murmurs, rubs or bruits. Abdomen is soft. She has good bowel sounds. There is no fluid wave. There is no palpable liver or spleen tip. Back exam shows no tenderness over the spine, ribs or hips. Extremities shows no  clubbing, cyanosis or edema. No swelling is noted in the legs. There is no venous cord in the right leg. She has good range of motion of her joints. Skin exam no rashes, ecchymoses or petechia. Neurological exam is nonfocal.  Lab Results  Component Value Date   WBC 6.9 03/24/2016   HGB 13.3 03/24/2016   HCT 39.1 03/24/2016   MCV 90 03/24/2016   PLT 232 03/24/2016     Chemistry      Component Value Date/Time   NA 138 03/02/2016 1445   NA 140 11/26/2015 0854   NA 144 12/31/2014 1329   K 3.8 03/02/2016 1445   K 3.8 11/26/2015 0854   K 3.8 12/31/2014 1329   CL 106 03/02/2016 1445   CL 100 12/31/2014 1329   CO2 24 03/02/2016 1445   CO2 21*  11/26/2015 0854   CO2 30 12/31/2014 1329   BUN 13 03/02/2016 1445   BUN 12.9 11/26/2015 0854   BUN 11 12/31/2014 1329   CREATININE 0.75 03/02/2016 1445   CREATININE 0.9 11/26/2015 0854   CREATININE 1.0 12/31/2014 1329      Component Value Date/Time   CALCIUM 9.5 03/02/2016 1445   CALCIUM 8.9 11/26/2015 0854   CALCIUM 9.3 12/31/2014 1329   ALKPHOS 74 03/02/2016 1445   ALKPHOS 72 11/26/2015 0854   ALKPHOS 59 12/31/2014 1329   AST 21 03/02/2016 1445   AST 18 11/26/2015 0854   AST 24 12/31/2014 1329   ALT 17 03/02/2016 1445   ALT 17 11/26/2015 0854   ALT 22 12/31/2014 1329   BILITOT 0.5 03/02/2016 1445   BILITOT <0.30 11/26/2015 0854   BILITOT 0.70 12/31/2014 1329         Impression and Plan: Melissa Morgan is 50 year old white female with a post surgical thrombus in the right leg. This has resolved.   She is doing well with low-dose Xarelto. I really think this is appropriate for her.  I don't see any need for any studies right now.  We will plan to get her back in 4 months.   I'm not sure why she is having the GI symptoms. She is seeing gastroenterology in a couple weeks. I'm sure they will do the appropriate testing. Almost sounds like she has IBS   Volanda Napoleon, MD 5/16/201711:09 AM

## 2016-04-03 ENCOUNTER — Telehealth: Payer: Self-pay | Admitting: Gastroenterology

## 2016-04-03 NOTE — Telephone Encounter (Signed)
Looks like this is an ongoing problem for her. Would have her call primary until her appointment.

## 2016-04-03 NOTE — Telephone Encounter (Signed)
Left message for patient to return my call.

## 2016-04-30 ENCOUNTER — Encounter: Payer: Self-pay | Admitting: Family Medicine

## 2016-04-30 ENCOUNTER — Other Ambulatory Visit: Payer: Self-pay | Admitting: Family Medicine

## 2016-04-30 MED ORDER — ALPRAZOLAM 0.5 MG PO TABS: 0.5000 mg | ORAL_TABLET | Freq: Two times a day (BID) | ORAL | Status: DC | PRN

## 2016-04-30 NOTE — Telephone Encounter (Signed)
Last OV 03/02/16 Alprazolam last filled 4/24/7 #60 with 0

## 2016-04-30 NOTE — Telephone Encounter (Signed)
Medication filled to pharmacy as requested.   

## 2016-05-15 ENCOUNTER — Ambulatory Visit (INDEPENDENT_AMBULATORY_CARE_PROVIDER_SITE_OTHER): Payer: BLUE CROSS/BLUE SHIELD | Admitting: Gastroenterology

## 2016-05-15 ENCOUNTER — Telehealth: Payer: Self-pay

## 2016-05-15 ENCOUNTER — Encounter: Payer: Self-pay | Admitting: Gastroenterology

## 2016-05-15 VITALS — BP 100/70 | HR 80 | Ht 67.0 in | Wt 194.4 lb

## 2016-05-15 DIAGNOSIS — R109 Unspecified abdominal pain: Secondary | ICD-10-CM | POA: Diagnosis not present

## 2016-05-15 DIAGNOSIS — F418 Other specified anxiety disorders: Secondary | ICD-10-CM

## 2016-05-15 DIAGNOSIS — R195 Other fecal abnormalities: Secondary | ICD-10-CM | POA: Diagnosis not present

## 2016-05-15 DIAGNOSIS — I82409 Acute embolism and thrombosis of unspecified deep veins of unspecified lower extremity: Secondary | ICD-10-CM | POA: Diagnosis not present

## 2016-05-15 DIAGNOSIS — R1084 Generalized abdominal pain: Secondary | ICD-10-CM

## 2016-05-15 NOTE — Patient Instructions (Signed)
It has been recommended to you by your physician that you have a(n) Colonoscopy completed. Per your request, we did not schedule the procedure(s) today. Please contact our office at [phone] should you decide to have the procedure completed.  We will contact Dr Marin Olp about holding your blood thinner.  Thank you for choosing [facility]  Dr Wilfrid Lund III

## 2016-05-15 NOTE — Progress Notes (Signed)
[facility] Consult Note:  History: Melissa Morgan 05/15/2016  Referring physician: Annye Asa, MD  Reason for consult/chief complaint: Abdominal Pain and Constipation   Subjective HPI:  This is a 50 year old woman referred by primary care for abdominal pain and altered bowel habits. A few months ago she had the subacute onset of some scattered and sometimes generalized abdominal pain that is dull and crampy in nature. One night awoke her from sleep. She also has alternating constipation and loose nonbloody stool. She has noticed no clear food or other triggers. She had her annual exam with gynecology, and she was told him an ultrasound of the left side of her colon was "swollen". Her symptoms seem better in the last few weeks on a probiotic, but unfortunately Bentyl has not been of any help.  ROS:  Review of Systems  Constitutional: Negative for appetite change and unexpected weight change.  HENT: Negative for mouth sores and voice change.   Eyes: Negative for pain and redness.  Respiratory: Negative for cough and shortness of breath.   Cardiovascular: Negative for chest pain and palpitations.  Genitourinary: Negative for dysuria and hematuria.  Musculoskeletal: Positive for myalgias. Negative for arthralgias.  Skin: Negative for pallor and rash.  Neurological: Positive for headaches. Negative for weakness.  Hematological: Negative for adenopathy.  Psychiatric/Behavioral: The patient is nervous/anxious.      Past Medical History: Past Medical History  Diagnosis Date  . Depression   . Tachycardia   . Anxiety   . Lacunar infarction ([place])   . Endometriosis   . Headache   . PONV (postoperative nausea and vomiting)     extremely sick  . Stroke ([facility])     cva  14  . GERD (gastroesophageal reflux disease)     occ  . Seizures ([place])     tx 5 yrs in middle school  continuous  . Arthritis   . Brain aneurysm   . DVT (deep venous thrombosis) (H[facility])   aneurysm  bleed, clipping and craniotomy 2015   Past Surgical History: Past Surgical History  Procedure Laterality Date  . Lasar laparoscopy      for endometriosis x3  . Craniotomy Right 07/06/2014    Procedure: CRANIOTOMY INTRACRANIAL ANEURYSM FOR CAROTID;  Surgeon: Ashok Pall, MD;  Location: [facility];  Service: Neurosurgery;  Laterality: Right;  right      Family History: Family History  Problem Relation Age of Onset  . Hypertension Brother     2 out of 3 brothers  . Heart attack Brother     2 out of 3 brothers  . Diabetes Brother   . Asthma Sister   . Hypertension Mother   . Heart attack Father   . Breast cancer Sister     Social History: Social History   Social History  . Marital Status: Legally Separated    Spouse Name: N/A  . Number of Children: N/A  . Years of Education: N/A   Social History Main Topics  . Smoking status: Former Smoker -- 0.00 packs/day for 30 years    Start date: 12/28/1983    Quit date: 05/26/2014  . Smokeless tobacco: Never Used     Comment: quit smoking 3 months ago  . Alcohol Use: No  . Drug Use: No  . Sexual Activity: Not Asked   Other Topics Concern  . None   Social History Narrative    Allergies: Allergies  Allergen Reactions  . Imitrex [Sumatriptan] Other (See Comments)    Increase  heart rate and chest pain   . Dilaudid [Hydromorphone Hcl] Nausea And Vomiting  . Ace Inhibitors Other (See Comments)    REACTION: unknown  . Ondansetron Other (See Comments)    Constipation and depression  . Tramadol Other (See Comments)    Palpitations; Dizziness  . Codeine Nausea Only  . Latex Itching    Outpatient Meds: Current Outpatient Prescriptions  Medication Sig Dispense Refill  . ALPRAZolam (XANAX) 0.5 MG tablet Take 1 tablet (0.5 mg total) by mouth 2 (two) times daily as needed. for anxiety 60 tablet 0  . dicyclomine (BENTYL) 20 MG tablet Take 1 tablet (20 mg total) by mouth 3 (three) times daily as needed for spasms. 60  tablet 1  . DULoxetine (CYMBALTA) 60 MG capsule Take 1 capsule (60 mg total) by mouth daily. 30 capsule 6  . metoprolol succinate (TOPROL-XL) 25 MG 24 hr tablet TAKE 1 TABLET(25 MG) BY MOUTH DAILY 30 tablet 6  . Rivaroxaban (XARELTO) 15 MG TABS tablet Take 1/2 pill a day. 30 tablet 0  . topiramate (TOPAMAX) 25 MG tablet Take 25 mg by mouth. Take 1 /day for 2 weeks, increase by 1 tab every 2 weeks until 4/day.     No current facility-administered medications for this visit.      ___________________________________________________________________ Objective  Exam:  BP 100/70 mmHg  Pulse 80  Ht 5\' 7"  (1.702 m)  Wt 194 lb 6.4 oz (88.179 kg)  BMI 30.44 kg/m2  LMP 12/28/2012   General: this is a(n) Pleasant well-appearing middle-aged woman   Eyes: sclera anicteric, no redness  ENT: oral mucosa moist without lesions, no cervical or supraclavicular lymphadenopathy, good dentition  CV: RRR without murmur, S1/S2, no JVD, 1+ bilateral peripheral edema  Resp: clear to auscultation bilaterally, normal RR and effort noted  GI: soft, no tenderness, with active bowel sounds. No guarding or palpable organomegaly noted.  Skin; warm and dry, no rash or jaundice noted  Neuro: awake, alert and oriented x 3. Normal gross motor function and fluent speech  Labs:  c diff neg   Assessment: Encounter Diagnoses  Name Primary?  . Diffuse abdominal pain Yes  . Loose stools   . Acute deep vein thrombosis (DVT) of other vein of lower extremity (HCC)   . Depression with anxiety     This sounds most like IBS. Colonoscopy is reasonable to rule out neoplasia. She asked if there is any kind of scan or pill capsule that would be an equivalent examination , but I'm afraid not. She is very concerned about being off her Xarelto for any period of time and asked me to communicate with her hematologist in [facility]. I asked her how she would like to proceed if they feel that it is low risk to be off  Xarelto for 2 days before and perhaps up to 2 days after a colonoscopy if polyps are removed. She says she is still not sure he would need to consider it further. We will fax a letter to Dr. Elnoria Howard and get their opinion. We will contact the patient after that and see how she would like to proceed.  Thank you for the courtesy of this consult.  Please call me with any questions or concerns.  Nelida Meuse III  CC: Annye Asa, MD

## 2016-05-15 NOTE — Telephone Encounter (Signed)
  05/15/2016   RE: Melissa Morgan DOB: [birth year] MRN: [id number]   Dear Dr Marin Olp,   We would like to schedule the above patient for an endoscopic procedure. Our records show that she is on anticoagulation therapy.   Please advise as to how long the patient may come off her therapy of Xarelto prior to the procedure.   Please fax back/ or route the completed form to Magdalene River, [place] at [phone].   Sincerely,  Melissa Morgan

## 2016-05-18 NOTE — Telephone Encounter (Signed)
Left a message to let pt know we had the clearance to hold her Xarelto. Asked her to return call to schedule colonoscopy.

## 2016-05-25 ENCOUNTER — Telehealth: Payer: Self-pay | Admitting: General Practice

## 2016-05-25 NOTE — Telephone Encounter (Signed)
FYI.Melissa Morgan [place] Patient Name: Melissa Morgan Gender: Female DOB: 1966-03-26 Age: 50 Y 3 M 10 D Return Phone Number: [phone] (Primary) Address: City/State/Zip: [place] Client [place] Client Site [place] Physician Dimple Nanas - MD Contact Type Call Who Is Calling Patient / Member / Family / Caregiver Call Type Triage / Clinical Relationship To Patient Self Return Phone Number [phone] (Primary) Chief Complaint Constipation Reason for Call Symptomatic / Request for Health Information Initial Comment Caller States she has not had a bowel movement in the last few days, is feeling uncomfortable PreDisposition Call Pharmacist Translation No Nurse Assessment Nurse: Mills-Hernandez, RN, Izora Gala Date/Time (Eastern Time): 05/23/2016 10:46:02 AM Confirm and document reason for call. If symptomatic, describe symptoms. You must click the next button to save text entered. ---Caller States she has not had a bowel movement in the last few days, is feeling uncomfortable. It has been 2 days since she had a BM. Has the patient traveled out of the country within the last 30 days? ---No Does the patient have any new or worsening symptoms? ---Yes Will a triage be completed? ---Yes Related visit to physician within the last 2 weeks? ---Yes Does the PT have any chronic conditions? (i.e. diabetes, asthma, etc.) ---No Is the patient pregnant or possibly pregnant? (Ask all females between the ages of 93-55) ---No Is this a behavioral health or substance abuse call? ---No Guidelines Guideline Title Affirmed Question Affirmed Notes Nurse Date/Time (Eastern Time) Constipation Constipation is a chronic symptom (recurrent or ongoing AND present > 4 weeks) Mills-Hernandez, RN, Izora Gala 05/23/2016 10:48:17 AM Disp.  Time Eilene Ghazi Time) Disposition Final User 05/23/2016 10:32:31 AM Attempt made - message left Mills-Hernandez, RN, Izora Gala PLEASE NOTE: All timestamps contained within this report are represented as [place] Standard Time. CONFIDENTIALTY NOTICE: This fax transmission is intended only for the addressee. It contains information that is legally privileged, confidential or otherwise protected from use or disclosure. If you are not the intended recipient, you are strictly prohibited from reviewing, disclosing, copying using or disseminating any of this information or taking any action in reliance on or regarding this information. If you have received this fax in error, please notify us immediately by telephone so that we can arrange for its return to [place]. Phone: [phone], Toll-Free: [phone], Fax: [phone] Page: 2 of 2 Call Id: [id number] 05/23/2016 10:53:30 AM See PCP within 2 Weeks Yes Mills-Hernandez, RN, Cindee Salt Understands: Yes Disagree/Comply: Comply Care Advice Given Per Guideline SEE PCP WITHIN 2 WEEKS: GENERAL CONSTIPATION INSTRUCTIONS: * Eat a high fiber diet. * Drink adequate liquids. * Avoid enemas and stimulant laxatives. HIGH FIBER DIET: * A high fiber diet will help improve your intestinal function and soften your BM's. The fiber works by holding more water in your stools. * Try to eat fruit and vegetables at each meal (peas, prunes, citrus, apples, beans, corn). LIQUIDS: * Prune juice is a natural laxative. BULK LAXATIVES: * METAMUCIL (psyllium fiber): One teaspoon (5 cc) in a glass of water twice daily. * Side effects: mild gas or a bloating sensation may occur. * Be certain to read the package instructions. OSMOTIC LAXATIVES: * MILK OF MAGNESIA (magnesium hydroxide): This is a mild and generally safe laxative. You can use Milk of Magnesia for short-term treatment of constipation. (Research suggests that Miralax may be more effective.) Dosage is 2 tablespoons (30 cc) PO.  Do  not use if you have kidney disease. * Be certain to read the package instructions. CALL BACK IF: * No BM for over 4 days * Constant or increasing abdominal pain * Abdominal swelling, vomiting or fever occur * You become worse. CARE ADVICE given per Constipation (Adult) guideline. Referrals REFERRED TO PCP OFFICE

## 2016-05-27 ENCOUNTER — Other Ambulatory Visit: Payer: Self-pay | Admitting: Hematology & Oncology

## 2016-05-27 ENCOUNTER — Encounter: Payer: Self-pay | Admitting: Hematology & Oncology

## 2016-05-28 ENCOUNTER — Other Ambulatory Visit: Payer: Self-pay | Admitting: Hematology & Oncology

## 2016-05-29 NOTE — Telephone Encounter (Signed)
Left message to return call 

## 2016-06-22 ENCOUNTER — Other Ambulatory Visit: Payer: Self-pay

## 2016-06-22 NOTE — Telephone Encounter (Signed)
No contact letter mailed. Will await return call.

## 2016-06-23 ENCOUNTER — Encounter: Payer: Self-pay | Admitting: Family Medicine

## 2016-06-24 ENCOUNTER — Telehealth: Payer: Self-pay | Admitting: Emergency Medicine

## 2016-06-24 MED ORDER — ALPRAZOLAM 0.5 MG PO TABS: 0.5000 mg | ORAL_TABLET | Freq: Two times a day (BID) | ORAL | 0 refills | Status: DC | PRN

## 2016-06-24 NOTE — Telephone Encounter (Signed)
Patient called today requesting refill of the Xanax. After reviewing patient's chart she sent an e-mail to the doctor requesting refill of her Xanax yesterday. I advised patient that Dr Birdie Riddle did receive her e-mail and rx was faxed to her pharmacy.

## 2016-06-24 NOTE — Telephone Encounter (Signed)
Last OV 03/02/16 (loose stools) Alprazolam last filled 04/30/16 #60 with 0

## 2016-06-24 NOTE — Telephone Encounter (Signed)
Medication filled to pharmacy as requested.   

## 2016-07-03 NOTE — Telephone Encounter (Signed)
Pt has never returned phone call to set up a colonoscopy.

## 2016-07-11 ENCOUNTER — Other Ambulatory Visit: Payer: Self-pay | Admitting: Family Medicine

## 2016-07-15 ENCOUNTER — Telehealth: Payer: Self-pay | Admitting: Family Medicine

## 2016-07-15 MED ORDER — DICYCLOMINE HCL 20 MG PO TABS: 20.0000 mg | ORAL_TABLET | Freq: Three times a day (TID) | ORAL | 1 refills | Status: DC | PRN

## 2016-07-15 NOTE — Telephone Encounter (Signed)
Grief is an appropriate response and phenergan will not help with this.  The bentyl will help w/ abdominal spasm and cramping.  If the nausea doesn't improve w/ time, she will need to schedule an appt w/ Dr Loletha Carrow

## 2016-07-15 NOTE — Telephone Encounter (Signed)
Patient requesting a call back to speak with CMA regarding recurrent stomach problems.  She is out of medication (doxycyclone) that was previously prescribed to her.

## 2016-07-15 NOTE — Telephone Encounter (Signed)
Called pt back and she asked for a refill of the bentyl, this was filled to her local pharmacy.    Pt also advised that she is under alo of stress her daughter's father passed away today and she says that the stress is upsetting her stomach and would like to know if you can call in some phenergan for her.   Pleas advise?

## 2016-07-15 NOTE — Telephone Encounter (Signed)
Patient notified of PCP recommendations and is agreement and expresses an understanding.  

## 2016-07-28 ENCOUNTER — Other Ambulatory Visit: Payer: BLUE CROSS/BLUE SHIELD

## 2016-07-28 ENCOUNTER — Ambulatory Visit: Payer: BLUE CROSS/BLUE SHIELD | Admitting: Hematology & Oncology

## 2016-08-02 ENCOUNTER — Other Ambulatory Visit: Payer: Self-pay | Admitting: Family Medicine

## 2016-08-05 ENCOUNTER — Encounter: Payer: Self-pay | Admitting: Family Medicine

## 2016-08-06 ENCOUNTER — Encounter: Payer: Self-pay | Admitting: Family Medicine

## 2016-08-06 ENCOUNTER — Ambulatory Visit (INDEPENDENT_AMBULATORY_CARE_PROVIDER_SITE_OTHER): Payer: BLUE CROSS/BLUE SHIELD | Admitting: Family Medicine

## 2016-08-06 VITALS — BP 110/78 | HR 72 | Temp 98.2°F | Resp 16 | Ht 67.0 in | Wt 195.4 lb

## 2016-08-06 DIAGNOSIS — R5383 Other fatigue: Secondary | ICD-10-CM | POA: Diagnosis not present

## 2016-08-06 DIAGNOSIS — R0789 Other chest pain: Secondary | ICD-10-CM | POA: Diagnosis not present

## 2016-08-06 LAB — TSH: TSH: 0.96 m[IU]/L

## 2016-08-06 LAB — CBC WITH DIFFERENTIAL/PLATELET
BASOS ABS: 0 {cells}/uL (ref 0–200)
Basophils Relative: 0 %
EOS ABS: 144 {cells}/uL (ref 15–500)
Eosinophils Relative: 2 %
HCT: 39 % (ref 35.0–45.0)
HEMOGLOBIN: 12.7 g/dL (ref 11.7–15.5)
LYMPHS ABS: 3096 {cells}/uL (ref 850–3900)
Lymphocytes Relative: 43 %
MCH: 29.4 pg (ref 27.0–33.0)
MCHC: 32.6 g/dL (ref 32.0–36.0)
MCV: 90.3 fL (ref 80.0–100.0)
MONOS PCT: 8 %
MPV: 9.6 fL (ref 7.5–12.5)
Monocytes Absolute: 576 cells/uL (ref 200–950)
NEUTROS ABS: 3384 {cells}/uL (ref 1500–7800)
NEUTROS PCT: 47 %
Platelets: 248 10*3/uL (ref 140–400)
RBC: 4.32 MIL/uL (ref 3.80–5.10)
RDW: 14.3 % (ref 11.0–15.0)
WBC: 7.2 10*3/uL (ref 3.8–10.8)

## 2016-08-06 LAB — BASIC METABOLIC PANEL
BUN: 11 mg/dL (ref 7–25)
CHLORIDE: 104 mmol/L (ref 98–110)
CO2: 26 mmol/L (ref 20–31)
Calcium: 9 mg/dL (ref 8.6–10.4)
Creat: 0.73 mg/dL (ref 0.50–1.05)
GLUCOSE: 76 mg/dL (ref 65–99)
POTASSIUM: 4.2 mmol/L (ref 3.5–5.3)
SODIUM: 139 mmol/L (ref 135–146)

## 2016-08-06 LAB — HEPATIC FUNCTION PANEL
ALK PHOS: 64 U/L (ref 33–130)
ALT: 15 U/L (ref 6–29)
AST: 21 U/L (ref 10–35)
Albumin: 4.2 g/dL (ref 3.6–5.1)
BILIRUBIN INDIRECT: 0.3 mg/dL (ref 0.2–1.2)
Bilirubin, Direct: 0.1 mg/dL (ref <=0.2)
TOTAL PROTEIN: 6.6 g/dL (ref 6.1–8.1)
Total Bilirubin: 0.4 mg/dL (ref 0.2–1.2)

## 2016-08-06 NOTE — Progress Notes (Signed)
Pre visit review using our clinic review tool, if applicable. No additional management support is needed unless otherwise documented below in the visit note. 

## 2016-08-06 NOTE — Patient Instructions (Signed)
Follow up as needed We'll notify you of your lab results and make any changes if needed Continue to work on healthy diet and regular exercise- this is a good stress reliever Your EKG is perfect- this is great news! I suspect that your fatigue and chest pain are stress related with all that you have going on but if things continue, let me know and we can refer to cardiology for complete evaluation Call with any questions or concerns Hang in there!!!

## 2016-08-06 NOTE — Progress Notes (Signed)
   Subjective:    Patient ID: Melissa Morgan, female    DOB: Jul 29, 1966, 50 y.o.   MRN: [id number]  HPI Fatigue- 'just not feeling good at all'.  sxs started 'a couple of weeks ago'.  'malaise, weak, chest pain off and on'.  sxs started 'a couple weeks' ago.  CP described as sharp and preceded by feeling 'sweaty'.  sxs are 'quick- nothing over a couple of minutes'.  Self limiting.  Denies SOB.  Pt got flu shot on Monday- 'i haven't felt good since'.  Pt's first husband (daughter's father) passed away recently and she is very concerned about her daughter.   Review of Systems For ROS see HPI     Objective:   Physical Exam  Constitutional: She is oriented to person, place, and time. She appears well-developed and well-nourished. No distress.  HENT:  Head: Normocephalic and atraumatic.  Eyes: Conjunctivae and EOM are normal. Pupils are equal, round, and reactive to light.  Neck: Normal range of motion. Neck supple. No thyromegaly present.  Cardiovascular: Normal rate, regular rhythm, normal heart sounds and intact distal pulses.   No murmur heard. Pulmonary/Chest: Effort normal and breath sounds normal. No respiratory distress.  Abdominal: Soft. She exhibits no distension. There is no tenderness.  Musculoskeletal: She exhibits no edema.  Lymphadenopathy:    She has no cervical adenopathy.  Neurological: She is alert and oriented to person, place, and time.  Skin: Skin is warm and dry.  Psychiatric: She has a normal mood and affect. Her behavior is normal.  Vitals reviewed.         Assessment & Plan:

## 2016-08-07 ENCOUNTER — Encounter: Payer: Self-pay | Admitting: Family Medicine

## 2016-08-07 ENCOUNTER — Other Ambulatory Visit: Payer: Self-pay | Admitting: Family Medicine

## 2016-08-07 DIAGNOSIS — E559 Vitamin D deficiency, unspecified: Secondary | ICD-10-CM

## 2016-08-07 LAB — VITAMIN D 25 HYDROXY (VIT D DEFICIENCY, FRACTURES): Vit D, 25-Hydroxy: 25 ng/mL — ABNORMAL LOW (ref 30–100)

## 2016-08-07 MED ORDER — VITAMIN D (ERGOCALCIFEROL) 1.25 MG (50000 UNIT) PO CAPS: 50000.0000 [IU] | ORAL_CAPSULE | ORAL | 0 refills | Status: DC

## 2016-08-07 MED ORDER — CHOLECALCIFEROL 50 MCG (2000 UT) PO CAPS: 1.0000 | ORAL_CAPSULE | Freq: Every day | ORAL | 0 refills | Status: AC

## 2016-08-07 NOTE — Assessment & Plan Note (Signed)
Recurrent problem for pt.  I suspect this is anxiety/depression/stress related but given her atypical CP, will get labs to r/o metabolic cause such as anemia or thyroid abnormality.  EKG WNL.  Encouraged her to exercise and work on Child psychotherapist.  Reviewed supportive care and red flags that should prompt return.  Pt expressed understanding and is in agreement w/ plan.

## 2016-08-07 NOTE — Assessment & Plan Note (Signed)
Atypical.  Suspect this is anxiety related or GERD.  EKG WNL.  Check labs.  Encouraged lifestyle and dietary modifications.  Will follow.

## 2016-08-10 MED ORDER — ALPRAZOLAM 0.5 MG PO TABS: 0.5000 mg | ORAL_TABLET | Freq: Two times a day (BID) | ORAL | 0 refills | Status: DC | PRN

## 2016-08-10 NOTE — Telephone Encounter (Signed)
Medication filled to pharmacy as requested.   

## 2016-08-10 NOTE — Telephone Encounter (Signed)
Last OV 08/06/16 Alprazolam last filled 06/24/16 #60 with 0

## 2016-09-01 ENCOUNTER — Other Ambulatory Visit: Payer: Self-pay | Admitting: Family Medicine

## 2016-10-04 ENCOUNTER — Other Ambulatory Visit: Payer: Self-pay | Admitting: Family Medicine

## 2016-10-05 ENCOUNTER — Encounter: Payer: Self-pay | Admitting: Physician Assistant

## 2016-10-05 ENCOUNTER — Ambulatory Visit (INDEPENDENT_AMBULATORY_CARE_PROVIDER_SITE_OTHER): Payer: BLUE CROSS/BLUE SHIELD | Admitting: Physician Assistant

## 2016-10-05 ENCOUNTER — Telehealth: Payer: Self-pay | Admitting: Family Medicine

## 2016-10-05 VITALS — BP 120/82 | HR 81 | Temp 97.8°F | Resp 16 | Ht 67.0 in | Wt 191.0 lb

## 2016-10-05 DIAGNOSIS — R112 Nausea with vomiting, unspecified: Secondary | ICD-10-CM | POA: Diagnosis not present

## 2016-10-05 LAB — CBC
HEMATOCRIT: 41.7 % (ref 35.0–45.0)
HEMOGLOBIN: 13.8 g/dL (ref 11.7–15.5)
MCH: 30.1 pg (ref 27.0–33.0)
MCHC: 33.1 g/dL (ref 32.0–36.0)
MCV: 90.8 fL (ref 80.0–100.0)
MPV: 9 fL (ref 7.5–12.5)
Platelets: 255 10*3/uL (ref 140–400)
RBC: 4.59 MIL/uL (ref 3.80–5.10)
RDW: 14.3 % (ref 11.0–15.0)
WBC: 8.1 10*3/uL (ref 3.8–10.8)

## 2016-10-05 LAB — POCT URINALYSIS DIPSTICK
BILIRUBIN UA: NEGATIVE
GLUCOSE UA: NEGATIVE
KETONES UA: 15
LEUKOCYTES UA: NEGATIVE
Nitrite, UA: NEGATIVE
PH UA: 5
Protein, UA: NEGATIVE
Spec Grav, UA: 1.025
Urobilinogen, UA: 0.2

## 2016-10-05 LAB — COMPREHENSIVE METABOLIC PANEL
ALBUMIN: 4.5 g/dL (ref 3.6–5.1)
ALK PHOS: 70 U/L (ref 33–130)
ALT: 15 U/L (ref 6–29)
AST: 22 U/L (ref 10–35)
BILIRUBIN TOTAL: 0.7 mg/dL (ref 0.2–1.2)
BUN: 12 mg/dL (ref 7–25)
CALCIUM: 9.3 mg/dL (ref 8.6–10.4)
CO2: 28 mmol/L (ref 20–31)
Chloride: 102 mmol/L (ref 98–110)
Creat: 0.72 mg/dL (ref 0.50–1.05)
Glucose, Bld: 104 mg/dL — ABNORMAL HIGH (ref 65–99)
POTASSIUM: 3.5 mmol/L (ref 3.5–5.3)
Sodium: 139 mmol/L (ref 135–146)
TOTAL PROTEIN: 7.2 g/dL (ref 6.1–8.1)

## 2016-10-05 LAB — GLUCOSE, POCT (MANUAL RESULT ENTRY): POC Glucose: 105 mg/dl — AB (ref 70–99)

## 2016-10-05 MED ORDER — PROMETHAZINE HCL 25 MG PO TABS: 25.0000 mg | ORAL_TABLET | Freq: Three times a day (TID) | ORAL | 0 refills | Status: DC | PRN

## 2016-10-05 NOTE — Telephone Encounter (Signed)
Pt called and LM on VM asking for a call back, pt states that she has been vomiting and that something has been going around at work [facility]) and wanted to speak to Vaughn or KT about it.

## 2016-10-05 NOTE — Patient Instructions (Signed)
Please continue hydration -- water or ginger-ale. Can try a G@ gatorade if tolerating water.  Once feeling better, start the diet below. Take the phenergan as directed for nausea. An over-the-counter ginger supplement (in the Vitamin Section) can also help.  Symptoms should gradually improve.  We are checking labs today.  If you are unable to tolerate fluids or vomiting recurs and is significant, please go to the ER for assessment. This is very important.   Bland Diet Introduction A bland diet consists of foods that do not have a lot of fat or fiber. Foods without fat or fiber are easier for the body to digest. They are also less likely to irritate your mouth, throat, stomach, and other parts of your gastrointestinal tract. A bland diet is sometimes called a BRAT diet. What is my plan? Your health care provider or dietitian may recommend specific changes to your diet to prevent and treat your symptoms, such as:  Eating small meals often.  Cooking food until it is soft enough to chew easily.  Chewing your food well.  Drinking fluids slowly.  Not eating foods that are very spicy, sour, or fatty.  Not eating citrus fruits, such as oranges and grapefruit. What do I need to know about this diet?  Eat a variety of foods from the bland diet food list.  Do not follow a bland diet longer than you have to.  Ask your health care provider whether you should take vitamins. What foods can I eat? Grains  Hot cereals, such as cream of wheat. Bread, crackers, or tortillas made from refined white flour. Rice. Vegetables  Canned or cooked vegetables. Mashed or boiled potatoes. Fruits  Bananas. Applesauce. Other types of cooked or canned fruit with the skin and seeds removed, such as canned peaches or pears. Meats and Other Protein Sources  Scrambled eggs. Creamy peanut butter or other nut butters. Lean, well-cooked meats, such as chicken or fish. Tofu. Soups or broths. Dairy  Low-fat dairy  products, such as milk, cottage cheese, or yogurt. Beverages  Water. Herbal tea. Apple juice. Sweets and Desserts  Pudding. Custard. Fruit gelatin. Ice cream. Fats and Oils  Mild salad dressings. Canola or olive oil. The items listed above may not be a complete list of allowed foods or beverages. Contact your dietitian for more options.  What foods are not recommended? Foods and ingredients that are often not recommended include:  Spicy foods, such as hot sauce or salsa.  Fried foods.  Sour foods, such as pickled or fermented foods.  Raw vegetables or fruits, especially citrus or berries.  Caffeinated drinks.  Alcohol.  Strongly flavored seasonings or condiments. The items listed above may not be a complete list of foods and beverages that are not allowed. Contact your dietitian for more information.  This information is not intended to replace advice given to you by your health care provider. Make sure you discuss any questions you have with your health care provider. Document Released: 02/17/2016 Document Revised: 04/02/2016 Document Reviewed: 11/07/2014  2017 Elsevier

## 2016-10-05 NOTE — Progress Notes (Signed)
Pre visit review using our clinic review tool, if applicable. No additional management support is needed unless otherwise documented below in the visit note. 

## 2016-10-05 NOTE — Telephone Encounter (Signed)
Can you please call pt and see what her concerns are and if she needs an appt.

## 2016-10-05 NOTE — Telephone Encounter (Signed)
Offered to make patient an appointment with Tabori and she stated that she could not make it out this far.  Discussed with Birdie Riddle her situation, and patient was advised to go to Urgent Care or ED if she felt like she needed to be seen but could not make it here.   Patient is aware, stated verbal understanding.

## 2016-10-05 NOTE — Progress Notes (Signed)
Patient presents to clinic today c/o multiple episodes of non-bloody emesis starting yesterday morning. Patient notes feeling fatigued the night before but otherwise ok. Notes associated symptoms include chills and sweats. Denies objective fever. Notes some loose stools but no increased frequency. Endorses good urination. Is trying to stay well hydrated but has not had anything to drink in the past 4 hours. Is unable to keep foods down. Notes significant nausea.  Denies recent travel. 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 Notes a headache today. Denies confusion.   Past Medical History:  Diagnosis Date  . Anxiety   . Arthritis   . Brain aneurysm   . Depression   . DVT (deep venous thrombosis) ([place])   . Endometriosis   . GERD (gastroesophageal reflux disease)    occ  . Headache   . Lacunar infarction ([place])   . PONV (postoperative nausea and vomiting)    extremely sick  . Seizures ([place])    tx 5 yrs in middle school  continuous  . Stroke ([place])    cva  14  . Tachycardia     Current Outpatient Prescriptions on File Prior to Visit  Medication Sig Dispense Refill  . ALPRAZolam (XANAX) 0.5 MG tablet Take 1 tablet (0.5 mg total) by mouth 2 (two) times daily as needed. for anxiety 60 tablet 0  . Cholecalciferol 2000 units CAPS Take 1 capsule (2,000 Units total) by mouth daily. 30 each 0  . dicyclomine (BENTYL) 20 MG tablet TAKE 1 TABLET(20 MG) BY MOUTH THREE TIMES DAILY AS NEEDED FOR SPASMS 60 tablet 6  . DULoxetine (CYMBALTA) 60 MG capsule TAKE 1 CAPSULE(60 MG) BY MOUTH DAILY 30 capsule 6  . metoprolol succinate (TOPROL-XL) 25 MG 24 hr tablet TAKE 1 TABLET(25 MG) BY MOUTH DAILY 30 tablet 6  . Rivaroxaban (XARELTO) 15 MG TABS tablet Take 1/2 pill a day. 30 tablet 0  . topiramate (TOPAMAX) 25 MG tablet Take 25 mg by mouth. Take 1 /day for 2 weeks, increase by 1 tab every 2 weeks until 4/day.    . Vitamin D, Ergocalciferol, (DRISDOL) 50000 units CAPS  capsule Take 1 capsule (50,000 Units total) by mouth every 7 (seven) days. 12 capsule 0  . XARELTO 15 MG TABS tablet TAKE 1 TABLET(15 MG) BY MOUTH DAILY WITH DINNER 30 tablet 6   No current facility-administered medications on file prior to visit.     Allergies  Allergen Reactions  . Imitrex [Sumatriptan] Other (See Comments)    Increase heart rate and chest pain   . Dilaudid [Hydromorphone Hcl] Nausea And Vomiting  . Ace Inhibitors Other (See Comments)    REACTION: unknown  . Ondansetron Other (See Comments)    Constipation and depression  . Tramadol Other (See Comments)    Palpitations; Dizziness  . Codeine Nausea Only  . Latex Itching    Family History  Problem Relation Age of Onset  . Hypertension Mother   . Heart attack Father   . Hypertension Brother     2 out of 3 brothers  . Heart attack Brother     2 out of 3 brothers  . Diabetes Brother   . Asthma Sister   . Breast cancer Sister     Social History   Social History  . Marital status: Legally Separated    Spouse name: N/A  . Number of children: N/A  . Years of education: N/A   Social History Main Topics  . Smoking  status: Former Smoker    Packs/day: 0.00    Years: 30.00    Start date: 12/28/1983    Quit date: 05/26/2014  . Smokeless tobacco: Never Used     Comment: quit smoking 3 months ago  . Alcohol use No  . Drug use: No  . Sexual activity: Not Asked   Other Topics Concern  . None   Social History Narrative  . None   Review of Systems - See HPI.  All other ROS are negative.  BP 120/82   Pulse 81   Temp 97.8 F (36.6 C) (Oral)   Resp 16   Ht _0  (1.702 m)   Wt 191 lb (86.6 kg)   LMP 12/28/2012   SpO2 98%   BMI 29.91 kg/m   Physical Exam  Constitutional: She is oriented to person, place, and time and well-developed, well-nourished, and in no distress.  HENT:  Head: Normocephalic and atraumatic.  Eyes: Conjunctivae are normal.  Neck: Neck supple.  Cardiovascular: Normal rate,  regular rhythm, normal heart sounds and intact distal pulses.   Pulmonary/Chest: Effort normal and breath sounds normal. No respiratory distress. She has no wheezes. She has no rales. She exhibits no tenderness.  Neurological: She is alert and oriented to person, place, and time.  Skin: Skin is warm and dry. No rash noted.  Good skin turgor on examination  Psychiatric: Affect normal.  Vitals reviewed.   Recent Results (from the past 2160 hour(s))  Basic metabolic panel     Status: None   Collection Time: 08/06/16  2:43 PM  Result Value Ref Range   Sodium 139 135 - 146 mmol/L   Potassium 4.2 3.5 - 5.3 mmol/L   Chloride 104 98 - 110 mmol/L   CO2 26 20 - 31 mmol/L   Glucose, Bld 76 65 - 99 mg/dL   BUN 11 7 - 25 mg/dL   Creat 0.73 0.50 - 1.05 mg/dL    Comment:   For patients > or = 50 years of age: The upper reference limit for Creatinine is approximately 13% higher for people identified as African-American.      Calcium 9.0 8.6 - 10.4 mg/dL  TSH     Status: None   Collection Time: 08/06/16  2:43 PM  Result Value Ref Range   TSH 0.96 mIU/L    Comment:   Reference Range   > or = 20 Years  0.40-4.50   Pregnancy Range First trimester  0.26-2.66 Second trimester 0.55-2.73 Third trimester  0.43-2.91     Hepatic function panel     Status: None   Collection Time: 08/06/16  2:43 PM  Result Value Ref Range   Total Bilirubin 0.4 0.2 - 1.2 mg/dL   Bilirubin, Direct 0.1 <=0.2 mg/dL   Indirect Bilirubin 0.3 0.2 - 1.2 mg/dL   Alkaline Phosphatase 64 33 - 130 U/L   AST 21 10 - 35 U/L   ALT 15 6 - 29 U/L   Total Protein 6.6 6.1 - 8.1 g/dL   Albumin 4.2 3.6 - 5.1 g/dL  CBC with Differential/Platelet     Status: None   Collection Time: 08/06/16  2:43 PM  Result Value Ref Range   WBC 7.2 3.8 - 10.8 K/uL   RBC 4.32 3.80 - 5.10 MIL/uL   Hemoglobin 12.7 11.7 - 15.5 g/dL   HCT 39.0 35.0 - 45.0 %   MCV 90.3 80.0 - 100.0 fL   MCH 29.4 27.0 - 33.0 pg   MCHC 32.6 32.0 -  36.0 g/dL    RDW 14.3 11.0 - 15.0 %   Platelets 248 140 - 400 K/uL   MPV 9.6 7.5 - 12.5 fL   Neutro Abs 3,384 1,500 - 7,800 cells/uL   Lymphs Abs 3,096 850 - 3,900 cells/uL   Monocytes Absolute 576 200 - 950 cells/uL   Eosinophils Absolute 144 15 - 500 cells/uL   Basophils Absolute 0 0 - 200 cells/uL   Neutrophils Relative % 47 %   Lymphocytes Relative 43 %   Monocytes Relative 8 %   Eosinophils Relative 2 %   Basophils Relative 0 %   Smear Review Criteria for review not met   VITAMIN D 25 Hydroxy (Vit-D Deficiency, Fractures)     Status: Abnormal   Collection Time: 08/06/16  2:43 PM  Result Value Ref Range   Vit D, 25-Hydroxy 25 (L) 30 - 100 ng/mL    Comment: Vitamin D Status           25-OH Vitamin D        Deficiency                <20 ng/mL        Insufficiency         20 - 29 ng/mL        Optimal             > or = 30 ng/mL   For 25-OH Vitamin D testing on patients on D2-supplementation and patients for whom quantitation of D2 and D3 fractions is required, the QuestAssureD 25-OH VIT D, (D2,D3), LC/MS/MS is recommended: order code [phone] (patients > 2 yrs).     Assessment/Plan: 1. Non-intractable vomiting with nausea, unspecified vomiting type Vitals are stable today. Goo dskin turgor and moist mucous membranes. Last emetic episode 4 hours ago. Has started keeping down fluids PO. Exam with mild generalized abdominal tenderness without focal tenderness or guarding. No rebound tenderness. BS normal limits. Urine dip with trace Ketones. Fasting glucose at 105. Will push PO fluids. Rx phenergan for nausea. Will start  BRAT diet. STAT CBC and CMP today. Strict ER precautions reviewed with patient. If any significant emesis occurs or if she is unable to tolerate PO fluids or has decreased urine output, she is to have someone take her to the ER.   - POCT Urinalysis Dipstick - POCT Glucose (CBG) - CBC - Comp Met (CMET)   Leeanne Rio, PA-C

## 2016-10-06 ENCOUNTER — Telehealth: Payer: Self-pay | Admitting: Family Medicine

## 2016-10-06 ENCOUNTER — Other Ambulatory Visit: Payer: Self-pay | Admitting: Family Medicine

## 2016-10-06 NOTE — Telephone Encounter (Signed)
Last OV 08/06/16 Alprazolam last filled 08/10/16 #60 with 0

## 2016-10-06 NOTE — Telephone Encounter (Signed)
Caller is the Environmental education officer with [facility], caller states that they treat pt for headache at their clinic. Caller wanted to make [place] aware that pt had gotten upset with them for not being able to fill out FMLA paperwork (they do not do this at their office) and states that we should be able to see notes through Epic/Care Everywhere to help the pt if we choose to do so.

## 2016-10-07 ENCOUNTER — Other Ambulatory Visit: Payer: Self-pay | Admitting: Family Medicine

## 2016-10-07 NOTE — Telephone Encounter (Signed)
Medication filled to pharmacy as requested.   

## 2016-10-07 NOTE — Telephone Encounter (Signed)
Noted  

## 2016-12-03 ENCOUNTER — Other Ambulatory Visit: Payer: Self-pay | Admitting: Family Medicine

## 2016-12-03 NOTE — Telephone Encounter (Signed)
Medication filled to pharmacy as requested.   

## 2016-12-03 NOTE — Telephone Encounter (Signed)
Last OV 08/06/16 Alprazolam last filled 10/07/16 #60 with 0

## 2017-01-02 ENCOUNTER — Emergency Department (HOSPITAL_BASED_OUTPATIENT_CLINIC_OR_DEPARTMENT_OTHER)
Admission: EM | Admit: 2017-01-02 | Discharge: 2017-01-02 | Disposition: A | Discharge status: Home / Self Care | Payer: 59 | Attending: Emergency Medicine | Admitting: Emergency Medicine

## 2017-01-02 ENCOUNTER — Encounter (HOSPITAL_BASED_OUTPATIENT_CLINIC_OR_DEPARTMENT_OTHER): Payer: Self-pay | Admitting: Emergency Medicine

## 2017-01-02 ENCOUNTER — Emergency Department (HOSPITAL_BASED_OUTPATIENT_CLINIC_OR_DEPARTMENT_OTHER): Payer: 59

## 2017-01-02 DIAGNOSIS — R51 Headache: Secondary | ICD-10-CM | POA: Insufficient documentation

## 2017-01-02 DIAGNOSIS — Z79899 Other long term (current) drug therapy: Secondary | ICD-10-CM | POA: Insufficient documentation

## 2017-01-02 DIAGNOSIS — Z0389 Encounter for observation for other suspected diseases and conditions ruled out: Secondary | ICD-10-CM | POA: Diagnosis not present

## 2017-01-02 DIAGNOSIS — G43909 Migraine, unspecified, not intractable, without status migrainosus: Secondary | ICD-10-CM | POA: Diagnosis not present

## 2017-01-02 DIAGNOSIS — Z87891 Personal history of nicotine dependence: Secondary | ICD-10-CM | POA: Insufficient documentation

## 2017-01-02 DIAGNOSIS — R519 Headache, unspecified: Secondary | ICD-10-CM

## 2017-01-02 LAB — BASIC METABOLIC PANEL
Anion gap: 8 (ref 5–15)
BUN: 18 mg/dL (ref 6–20)
CALCIUM: 9.3 mg/dL (ref 8.9–10.3)
CO2: 26 mmol/L (ref 22–32)
CREATININE: 0.83 mg/dL (ref 0.44–1.00)
Chloride: 104 mmol/L (ref 101–111)
GFR calc non Af Amer: >60 mL/min (ref >60)
Glucose, Bld: 105 mg/dL — ABNORMAL HIGH (ref 65–99)
Potassium: 3.8 mmol/L (ref 3.5–5.1)
SODIUM: 138 mmol/L (ref 135–145)

## 2017-01-02 LAB — CBC WITH DIFFERENTIAL/PLATELET
BASOS PCT: 0 %
Basophils Absolute: 0 10*3/uL (ref 0.0–0.1)
Eosinophils Absolute: 0.2 10*3/uL (ref 0.0–0.7)
Eosinophils Relative: 2 %
HCT: 38.9 % (ref 36.0–46.0)
Hemoglobin: 13.3 g/dL (ref 12.0–15.0)
Lymphocytes Relative: 44 %
Lymphs Abs: 4 10*3/uL (ref 0.7–4.0)
MCH: 31.2 pg (ref 26.0–34.0)
MCHC: 34.2 g/dL (ref 30.0–36.0)
MCV: 91.3 fL (ref 78.0–100.0)
Monocytes Absolute: 0.8 10*3/uL (ref 0.1–1.0)
Monocytes Relative: 9 %
Neutro Abs: 3.9 10*3/uL (ref 1.7–7.7)
Neutrophils Relative %: 45 %
PLATELETS: 274 10*3/uL (ref 150–400)
RBC: 4.26 MIL/uL (ref 3.87–5.11)
RDW: 13.9 % (ref 11.5–15.5)
WBC: 8.9 10*3/uL (ref 4.0–10.5)

## 2017-01-02 LAB — TROPONIN I: Troponin I: <0.03 ng/mL (ref <0.03)

## 2017-01-02 MED ORDER — PROMETHAZINE HCL 25 MG/ML IJ SOLN
12.5000 mg | Freq: Once | INTRAMUSCULAR | Status: AC
  Administered 2017-01-02: 12.5 mg via INTRAVENOUS
  Filled 2017-01-02: qty 1

## 2017-01-02 MED ORDER — DEXAMETHASONE SODIUM PHOSPHATE 10 MG/ML IJ SOLN
10.0000 mg | Freq: Once | INTRAMUSCULAR | Status: AC
  Administered 2017-01-02: 10 mg via INTRAVENOUS
  Filled 2017-01-02: qty 1

## 2017-01-02 MED ORDER — SODIUM CHLORIDE 0.9 % IV BOLUS (SEPSIS)
1000.0000 mL | Freq: Once | INTRAVENOUS | Status: AC
Start: 2017-01-02 — End: 2017-01-02
  Administered 2017-01-02: 1000 mL via INTRAVENOUS

## 2017-01-02 NOTE — ED Notes (Signed)
Pt states after decadron administration that her chest felt "heavy" and resolved within a few minutes.  Dorothea Ogle, [place] informed.

## 2017-01-02 NOTE — ED Notes (Signed)
Pt ambulated to bathroom 

## 2017-01-02 NOTE — Discharge Instructions (Signed)
Take medication as prescribed. Follow up with neurologist. Return to the ED if your symptoms worsen.

## 2017-01-02 NOTE — ED Provider Notes (Signed)
[facility] DEPT [facility] Provider Note   CSN: [id number] Arrival date & time: 01/02/17  1805  By signing my name below, I, Neta Mends, attest that this documentation has been prepared under the direction and in the presence of Melina Schools, PA-C. Electronically Signed: Neta Mends, ED Scribe. 01/02/2017. 8:13 PM.    History   Chief Complaint Chief Complaint  Patient presents with  . Headache    The history is provided by the patient. No language interpreter was used.   HPI Comments:  Melissa Morgan is a 51 y.o. female with PMHx of migraines and aneurisms who presents to the Emergency Department complaining of an gradual onset, intermittent headache x 3 weeks. She states that the headache has been primarily on the left side, and today that headache began to worsen and is now on the right side. Pt has a hx of migraines, sees a neurologist. Pt complains of associated nausea, palpitations, and photophobia. Pt reports previous similar migraines, but states this one just want go away with medicine at home. Pt does have a history of anuerysm that required repairs. States this headache is not as bad as when she was diagnosed with an aneurysm.  Pt has taken tylenol 2 hours ago with no relief. Pt denies double vision, vomiting, fever, speech changes, diarrhea, abdominal pain, chest pain.    Past Medical History:  Diagnosis Date  . Anxiety   . Arthritis   . Brain aneurysm   . Depression   . DVT (deep venous thrombosis) ([place])   . Endometriosis   . GERD (gastroesophageal reflux disease)    occ  . Headache   . Lacunar infarction ([place])   . PONV (postoperative nausea and vomiting)    extremely sick  . Seizures ([place])    tx 5 yrs in middle school  continuous  . Stroke ([place])    cva  14  . Tachycardia     Patient Active Problem List   Diagnosis Date Noted  . Fatigue 08/06/2016  . Loose stools 03/02/2016  . Diffuse abdominal pain 03/02/2016  . Acute frontal sinusitis  11/13/2015  . Chronic daily headache 04/14/2015  . Aneurysm, cerebral, nonruptured 02/12/2015  . Worsening headaches 02/12/2015  . Breast pain, right 12/19/2014  . Polyarthralgia 12/19/2014  . Chest pain 10/01/2014  . Nausea without vomiting 10/01/2014  . GERD (gastroesophageal reflux disease) 07/30/2014  . DVT (deep venous thrombosis) ([place]) 07/23/2014  . Cerebral aneurysm without rupture 07/06/2014  . Depression with anxiety 03/23/2014    Past Surgical History:  Procedure Laterality Date  . CRANIOTOMY Right 07/06/2014   Procedure: CRANIOTOMY INTRACRANIAL ANEURYSM FOR CAROTID;  Surgeon: Ashok Pall, MD;  Location: [facility];  Service: Neurosurgery;  Laterality: Right;  right   . lasar laparoscopy     for endometriosis x3    OB History    No data available       Home Medications    Prior to Admission medications   Medication Sig Start Date End Date Taking? Authorizing Provider  ALPRAZolam [name]) 0.5 MG tablet TAKE 1 TABLET BY MOUTH TWICE DAILY AS NEEDED FOR ANXIETY 12/03/16  Yes Midge Minium, MD  Cholecalciferol 2000 units CAPS Take 1 capsule (2,000 Units total) by mouth daily. 08/07/16  Yes Midge Minium, MD  dicyclomine (BENTYL) 20 MG tablet TAKE 1 TABLET(20 MG) BY MOUTH THREE TIMES DAILY AS NEEDED FOR SPASMS 10/05/16  Yes Midge Minium, MD  DULoxetine (CYMBALTA) 60 MG capsule TAKE 1 CAPSULE(60 MG)  BY MOUTH DAILY 07/14/16  Yes Midge Minium, MD  metoprolol succinate (TOPROL-XL) 25 MG 24 hr tablet TAKE 1 TABLET(25 MG) BY MOUTH DAILY 09/01/16  Yes Midge Minium, MD  Rivaroxaban (XARELTO) 15 MG TABS tablet Take 1/2 pill a day. 11/26/15  Yes Volanda Napoleon, MD  Vitamin D, Ergocalciferol, (DRISDOL) 50000 units CAPS capsule Take 1 capsule (50,000 Units total) by mouth every 7 (seven) days. 08/07/16  Yes Midge Minium, MD  promethazine (PHENERGAN) 25 MG tablet Take 1 tablet (25 mg total) by mouth every 8 (eight) hours as needed for nausea or vomiting.  10/05/16   Brunetta Jeans, PA-C  topiramate (TOPAMAX) 25 MG tablet Take 25 mg by mouth. Take 1 /day for 2 weeks, increase by 1 tab every 2 weeks until 4/day. 06/13/15   Historical Provider, MD  XARELTO 15 MG TABS tablet TAKE 1 TABLET(15 MG) BY MOUTH DAILY WITH DINNER 05/29/16   Volanda Napoleon, MD    Family History Family History  Problem Relation Age of Onset  . Hypertension Mother   . Heart attack Father   . Hypertension Brother     2 out of 3 brothers  . Heart attack Brother     2 out of 3 brothers  . Diabetes Brother   . Asthma Sister   . Breast cancer Sister     Social History Social History  Substance Use Topics  . Smoking status: Former Smoker    Packs/day: 0.00    Years: 30.00    Start date: 12/28/1983    Quit date: 05/26/2014  . Smokeless tobacco: Never Used     Comment: quit smoking 3 months ago  . Alcohol use 0.0 oz/week     Comment: socially     Allergies   Imitrex [sumatriptan]; Dilaudid [hydromorphone hcl]; Ace inhibitors; Ondansetron; Tramadol; Codeine; and Latex   Review of Systems Review of Systems  Constitutional: Negative for fever.  Eyes: Positive for photophobia. Negative for visual disturbance.  Respiratory: Negative for cough and shortness of breath.   Cardiovascular: Positive for palpitations. Negative for chest pain.  Gastrointestinal: Positive for nausea. Negative for abdominal pain, diarrhea and vomiting.  Neurological: Positive for headaches. Negative for dizziness, syncope, speech difficulty, weakness and light-headedness.     Physical Exam Updated Vital Signs BP 138/92 (BP Location: Left Arm)   Pulse 96   Temp 98.3 F (36.8 C) (Oral)   Resp 20   Ht 5\' 7"  (1.702 m)   Wt 199 lb (90.3 kg)   LMP 12/28/2012   SpO2 99%   BMI 31.17 kg/m   Physical Exam  Constitutional: She is oriented to person, place, and time. She appears well-developed and well-nourished. No distress.  Resting comfortably on the bed with the lights off and does  not appear to be in nad.  HENT:  Head: Normocephalic and atraumatic.  Right Ear: Tympanic membrane, external ear and ear canal normal.  Left Ear: Tympanic membrane, external ear and ear canal normal.  Nose: Nose normal.  Mouth/Throat: Uvula is midline and oropharynx is clear and moist.  No tenderness to palpatio of the temporal arteries  Eyes: Conjunctivae and EOM are normal. Pupils are equal, round, and reactive to light.  Neck: Normal range of motion. Neck supple.  No nuchal rigidity   Cardiovascular: Normal rate, regular rhythm and intact distal pulses.  Exam reveals no gallop and no friction rub.   Murmur heard. Pulmonary/Chest: Effort normal and breath sounds normal. No respiratory distress. She has  no wheezes.  Abdominal: Soft. Bowel sounds are normal. There is no tenderness. There is no rebound and no guarding.  Musculoskeletal: Normal range of motion. She exhibits no edema.  Lymphadenopathy:    She has no cervical adenopathy.  Neurological: She is alert and oriented to person, place, and time. She has normal reflexes. GCS eye subscore is 4. GCS verbal subscore is 5. GCS motor subscore is 6.  The patient is alert, attentive, and oriented x 3. Speech is clear. Cranial nerve II-VII grossly intact. Negative pronator drift. Sensation intact. Strength 5/5 in all extremities. Reflexes 2+ and symmetric at biceps, triceps, knees, and ankles. Rapid alternating movement and fine finger movements intact. Romberg is absent. Posture and gait normal.   Skin: Skin is warm and dry.  Psychiatric: She has a normal mood and affect.  Nursing note and vitals reviewed.    ED Treatments / Results  DIAGNOSTIC STUDIES:  Oxygen Saturation is 99% on RA, normal by my interpretation.    COORDINATION OF CARE:  8:13 PM Discussed treatment plan with pt at bedside and pt agreed to plan.   Labs (all labs ordered are listed, but only abnormal results are displayed) Labs Reviewed  BASIC METABOLIC PANEL -  Abnormal; Notable for the following:       Result Value   Glucose, Bld 105 (*)    All other components within normal limits  TROPONIN I  CBC WITH DIFFERENTIAL/PLATELET    EKG  EKG Interpretation  Date/Time:  Saturday January 02 2017 20:31:10 EST Ventricular Rate:  73 PR Interval:    QRS Duration: 95 QT Interval:  411 QTC Calculation: 453 R Axis:   49 Text Interpretation:  Sinus rhythm Since prior ECG, TW inverted in aVL with improve TWI in III Confirmed by [facility] MD, PEDRO ([id number]) on 01/04/2017 7:45:05 PM       Radiology No results found.  Procedures Procedures (including critical care time)  Medications Ordered in ED Medications  sodium chloride 0.9 % bolus 1,000 mL (0 mLs Intravenous Stopped 01/02/17 2121)  promethazine (PHENERGAN) injection 12.5 mg (12.5 mg Intravenous Given 01/02/17 2040)  dexamethasone (DECADRON) injection 10 mg (10 mg Intravenous Given 01/02/17 2039)     Initial Impression / Assessment and Plan / ED Course  I have reviewed the triage vital signs and the nursing notes.  Pertinent labs & imaging results that were available during my care of the patient were reviewed by me and considered in my medical decision making (see chart for details).     Pt HA treated and improved while in ED.  Presentation is like pts typical HA and non concerning for [facility], ICH, Meningitis, or temporal arteritis. Pt is afebrile with no focal neuro deficits, nuchal rigidity, or change in vision. Pt states her headache as completely resolved after meds. She is ready to be discharged. CT of head without any acute abnormalities. Denies thunderclap headache or symptoms concerning for maximal or sudden onset. Pt did complain of intermittent palpitation over the past 2 weeks. Troponin negative. EKG with stable changes. All other labs are unremarkable. Vital signs have been stable. Denies any cp or sob. Doubt ACS or PE. Pt does have history of aneurysm but have low suspicion with her  headache given characteristics of ha. Have encouraged follow up with neurologist next week for recheck. Pt verbalizes understanding and is agreeable with plan to dc. Pt was dicussed and evaluated by Dr. Billy Fischer who is agreeable to the above plan. Pt is hemodynamically stable, in NAD, &  able to ambulate in the ED. Pain has been managed & has no complaints prior to dc. Pt is comfortable with above plan and is stable for discharge at this time. All questions were answered prior to disposition. Strict return precautions for f/u to the ED were discussed.   Final Clinical Impressions(s) / ED Diagnoses   Final diagnoses:  Nonintractable headache, unspecified chronicity pattern, unspecified headache type    New Prescriptions Discharge Medication List as of 01/02/2017 11:37 PM    I personally performed the services described in this documentation, which was scribed in my presence. The recorded information has been reviewed and is accurate.     Doristine Devoid, PA-C 01/07/17 1145    Gareth Morgan, MD 01/07/17 [phone]

## 2017-01-02 NOTE — ED Notes (Signed)
Melissa Morgan informed that pt states the medication made her legs "restless."

## 2017-01-02 NOTE — ED Triage Notes (Signed)
Has had a H/A today to the left side with nausea, worse today but has been having a h/a intermittently over the past few weeks. Has chronic h/a's to right side of head. Took 650mg  of tyelnol x 2 hrs ago and bentyl.

## 2017-01-03 ENCOUNTER — Other Ambulatory Visit: Payer: Self-pay | Admitting: Hematology & Oncology

## 2017-01-26 ENCOUNTER — Other Ambulatory Visit: Payer: Self-pay | Admitting: Family Medicine

## 2017-01-26 MED ORDER — ALPRAZOLAM 0.5 MG PO TABS: 0.5000 mg | ORAL_TABLET | Freq: Two times a day (BID) | ORAL | 0 refills | Status: DC | PRN

## 2017-01-26 NOTE — Telephone Encounter (Signed)
Last OV 08/06/16 Alprazolam last filled 12/03/16 #60 with 0

## 2017-02-07 ENCOUNTER — Other Ambulatory Visit: Payer: Self-pay | Admitting: Hematology & Oncology

## 2017-02-10 ENCOUNTER — Ambulatory Visit (INDEPENDENT_AMBULATORY_CARE_PROVIDER_SITE_OTHER): Payer: 59 | Admitting: Physician Assistant

## 2017-02-10 ENCOUNTER — Ambulatory Visit (INDEPENDENT_AMBULATORY_CARE_PROVIDER_SITE_OTHER): Payer: 59

## 2017-02-10 ENCOUNTER — Encounter: Payer: Self-pay | Admitting: Physician Assistant

## 2017-02-10 VITALS — BP 102/70 | HR 84 | Temp 98.0°F | Resp 14 | Ht 67.0 in | Wt 202.0 lb

## 2017-02-10 DIAGNOSIS — M5090 Cervical disc disorder, unspecified, unspecified cervical region: Secondary | ICD-10-CM

## 2017-02-10 DIAGNOSIS — M50321 Other cervical disc degeneration at C4-C5 level: Secondary | ICD-10-CM | POA: Diagnosis not present

## 2017-02-10 DIAGNOSIS — F418 Other specified anxiety disorders: Secondary | ICD-10-CM

## 2017-02-10 DIAGNOSIS — F32A Depression, unspecified: Secondary | ICD-10-CM

## 2017-02-10 DIAGNOSIS — M79671 Pain in right foot: Secondary | ICD-10-CM

## 2017-02-10 DIAGNOSIS — F329 Major depressive disorder, single episode, unspecified: Secondary | ICD-10-CM

## 2017-02-10 DIAGNOSIS — F419 Anxiety disorder, unspecified: Secondary | ICD-10-CM

## 2017-02-10 DIAGNOSIS — R5382 Chronic fatigue, unspecified: Secondary | ICD-10-CM | POA: Diagnosis not present

## 2017-02-10 LAB — CBC
HCT: 41.3 % (ref 36.0–46.0)
Hemoglobin: 14 g/dL (ref 12.0–15.0)
MCHC: 33.9 g/dL (ref 30.0–36.0)
MCV: 92.2 fl (ref 78.0–100.0)
Platelets: 242 10*3/uL (ref 150.0–400.0)
RBC: 4.48 Mil/uL (ref 3.87–5.11)
RDW: 14.3 % (ref 11.5–15.5)
WBC: 7.3 10*3/uL (ref 4.0–10.5)

## 2017-02-10 LAB — SEDIMENTATION RATE: SED RATE: 12 mm/h (ref 0–30)

## 2017-02-10 LAB — TSH: TSH: 1.12 u[IU]/mL (ref 0.35–4.50)

## 2017-02-10 LAB — IRON: IRON: 81 ug/dL (ref 42–145)

## 2017-02-10 LAB — VITAMIN B12: VITAMIN B 12: 650 pg/mL (ref 211–911)

## 2017-02-10 MED ORDER — CYCLOBENZAPRINE HCL 5 MG PO TABS: 5.0000 mg | ORAL_TABLET | Freq: Every day | ORAL | 0 refills | Status: DC

## 2017-02-10 NOTE — Progress Notes (Signed)
Patient presents to clinic today with multiple complaints.  Patient endorses intermittent sweating with exertion at work due to heavy lifting and need to be constantly running.States this started initially after craniotomy in 2015 and has been present since that time. Also notes intermittent headache and brain fog that she has had since craniotomy.  Headache described as aching, usually around site of craniotomy. Notes headaches directly correlated to stress/anxiety at work. Occurring maybe 2 days per week. Is sleeping soundly. Wears CPAP at night. No issues waking up. Feels refreshed but states energy is quickly sapped from her.  Denies night sweats. Was seen in ER in February for a more severe headache. CT obtained at that time was negative for any acute intracranial pathology. Showed stable postsurgical aneurysm clipping. Is followed by Neurology giving history of aneurysm/craniotomy and due to chronic headaches. Has not followed up with them in over 1 year.   Patient states that her appetite is too good. She feels that she is stress eating. Is on Cymbalta due to history of anxiety/depression and chronic myalgias. States that she feels medication is not as effective as it used to be. Notes being on numerous medications for mood and chronic headaches previously, with significant side effects. Is unable to name all medications but does remember bad issue with Topamax and Amitriptyline. Cannot elaborate of side effects.  Patient denies panic attack. Denies SI/I.  Patient also endorses chronic R arm pain starting in neck and occasionally running down the arm. Sometimes associated tingling of RUE. Denies symptoms of LUE. Denies numbness or weakness. Denies decreased ROM. 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 States she was told several years ago by another provider that she had some stenosis in the neck. Has not taken anything for symptoms.     Patient also endorses 2-3 weeks of pain in anterior right foot. Noting bruising of the mid fore foot in absence of trauma. Is currently on Xarelto prescribed by hematology. States foot hurts with weight bearing and is alleviated by rest. Denies numbness, tingling and swelling of extremity. Notes history of stress fracture in this area a few years ago. Is concerned about another stress fracture. Is requesting imaging.  Patient would also like to discuss intermittent FMLA regarding headaches and mood. Is concerned she will lose her job if she has to miss work for health-related issues.  Past Medical History:  Diagnosis Date  . Anxiety   . Arthritis   . Brain aneurysm   . Depression   . DVT (deep venous thrombosis) ([place])   . Endometriosis   . GERD (gastroesophageal reflux disease)    occ  . Headache   . Lacunar infarction ([place])   . PONV (postoperative nausea and vomiting)    extremely sick  . Seizures ([place])    tx 5 yrs in middle school  continuous  . Stroke ([place])    cva  14  . Tachycardia     Current Outpatient Prescriptions on File Prior to Visit  Medication Sig Dispense Refill  . ALPRAZolam (XANAX) 0.5 MG tablet Take 1 tablet (0.5 mg total) by mouth 2 (two) times daily as needed. for anxiety 60 tablet 0  . Cholecalciferol 2000 units CAPS Take 1 capsule (2,000 Units total) by mouth daily. 30 each 0  . dicyclomine (BENTYL) 20 MG tablet TAKE 1 TABLET(20 MG) BY MOUTH THREE TIMES DAILY AS NEEDED FOR SPASMS 60 tablet 6  .  DULoxetine (CYMBALTA) 60 MG capsule TAKE 1 CAPSULE(60 MG) BY MOUTH DAILY 30 capsule 6  . metoprolol succinate (TOPROL-XL) 25 MG 24 hr tablet TAKE 1 TABLET(25 MG) BY MOUTH DAILY 30 tablet 6  . Rivaroxaban (XARELTO) 15 MG TABS tablet Take 1/2 pill a day. 30 tablet 0   No current facility-administered medications on file prior to visit.     Allergies  Allergen Reactions  . Imitrex [Sumatriptan] Other (See Comments)    Increase heart rate and chest pain   .  Dilaudid [Hydromorphone Hcl] Nausea And Vomiting  . Ace Inhibitors Other (See Comments)    REACTION: unknown  . Ondansetron Other (See Comments)    Constipation and depression  . Tramadol Other (See Comments)    Palpitations; Dizziness  . Codeine Nausea Only  . Latex Itching    Family History  Problem Relation Age of Onset  . Hypertension Mother   . Heart attack Father   . Hypertension Brother     2 out of 3 brothers  . Heart attack Brother     2 out of 3 brothers  . Diabetes Brother   . Asthma Sister   . Breast cancer Sister     Social History   Social History  . Marital status: Divorced    Spouse name: N/A  . Number of children: N/A  . Years of education: N/A   Social History Main Topics  . Smoking status: Former Smoker    Packs/day: 0.00    Years: 30.00    Start date: 12/28/1983    Quit date: 05/26/2014  . Smokeless tobacco: Never Used     Comment: quit smoking 3 months ago  . Alcohol use 0.0 oz/week     Comment: socially  . Drug use: No  . Sexual activity: Not Asked   Other Topics Concern  . None   Social History Narrative  . None   Review of Systems - See HPI.  All other ROS are negative.  BP 102/70   Pulse 84   Temp 98 F (36.7 C) (Oral)   Resp 14   Ht _0  (1.702 m)   Wt 202 lb (91.6 kg)   LMP 12/28/2012   SpO2 99%   BMI 31.64 kg/m   Physical Exam  Constitutional: She is oriented to person, place, and time and well-developed, well-nourished, and in no distress.  HENT:  Head: Normocephalic and atraumatic.  Eyes: Conjunctivae are normal.  Neck: Neck supple.  Cardiovascular: Normal rate, regular rhythm, normal heart sounds and intact distal pulses.   Pulmonary/Chest: Effort normal and breath sounds normal. No respiratory distress. She has no wheezes. She has no rales. She exhibits no tenderness.  Musculoskeletal:       Right shoulder: She exhibits normal range of motion, no tenderness, no swelling, no pain, no spasm, normal pulse and normal  strength.       Left shoulder: Normal.       Right elbow: Normal.      Cervical back: She exhibits pain. She exhibits no tenderness and no bony tenderness.       Right upper arm: Normal.       Feet:  Neurological: She is alert and oriented to person, place, and time.  Skin: Skin is warm and dry. No rash noted.  Psychiatric: Affect normal.  Vitals reviewed.  Recent Results (from the past 2160 hour(s))  Troponin I     Status: None   Collection Time: 01/02/17  8:48 PM  Result Value  Ref Range   Troponin I <0.03 <0.03 ng/mL  Basic metabolic panel     Status: Abnormal   Collection Time: 01/02/17  8:48 PM  Result Value Ref Range   Sodium 138 135 - 145 mmol/L   Potassium 3.8 3.5 - 5.1 mmol/L   Chloride 104 101 - 111 mmol/L   CO2 26 22 - 32 mmol/L   Glucose, Bld 105 (H) 65 - 99 mg/dL   BUN 18 6 - 20 mg/dL   Creatinine, Ser 0.83 0.44 - 1.00 mg/dL   Calcium 9.3 8.9 - 10.3 mg/dL   GFR calc non Af Amer >60 >60 mL/min   GFR calc Af Amer >60 >60 mL/min    Comment: (NOTE) The eGFR has been calculated using the CKD EPI equation. This calculation has not been validated in all clinical situations. eGFR's persistently <60 mL/min signify possible Chronic Kidney Disease.    Anion gap 8 5 - 15  CBC with Differential     Status: None   Collection Time: 01/02/17  8:48 PM  Result Value Ref Range   WBC 8.9 4.0 - 10.5 K/uL   RBC 4.26 3.87 - 5.11 MIL/uL   Hemoglobin 13.3 12.0 - 15.0 g/dL   HCT 38.9 36.0 - 46.0 %   MCV 91.3 78.0 - 100.0 fL   MCH 31.2 26.0 - 34.0 pg   MCHC 34.2 30.0 - 36.0 g/dL   RDW 13.9 11.5 - 15.5 %   Platelets 274 150 - 400 K/uL   Neutrophils Relative % 45 %   Neutro Abs 3.9 1.7 - 7.7 K/uL   Lymphocytes Relative 44 %   Lymphs Abs 4.0 0.7 - 4.0 K/uL   Monocytes Relative 9 %   Monocytes Absolute 0.8 0.1 - 1.0 K/uL   Eosinophils Relative 2 %   Eosinophils Absolute 0.2 0.0 - 0.7 K/uL   Basophils Relative 0 %   Basophils Absolute 0.0 0.0 - 0.1 K/uL  Sed Rate (ESR)      Status: None   Collection Time: 02/10/17 11:56 AM  Result Value Ref Range   Sed Rate 12 0 - 30 mm/hr  TSH     Status: None   Collection Time: 02/10/17 11:56 AM  Result Value Ref Range   TSH 1.12 0.35 - 4.50 uIU/mL  CBC     Status: None   Collection Time: 02/10/17 11:56 AM  Result Value Ref Range   WBC 7.3 4.0 - 10.5 K/uL   RBC 4.48 3.87 - 5.11 Mil/uL   Platelets 242.0 150.0 - 400.0 K/uL   Hemoglobin 14.0 12.0 - 15.0 g/dL   HCT 41.3 36.0 - 46.0 %   MCV 92.2 78.0 - 100.0 fl   MCHC 33.9 30.0 - 36.0 g/dL   RDW 14.3 11.5 - 15.5 %  Iron     Status: None   Collection Time: 02/10/17 11:56 AM  Result Value Ref Range   Iron 81 42 - 145 ug/dL  B12     Status: None   Collection Time: 02/10/17 11:56 AM  Result Value Ref Range   Vitamin B-12 650 211 - 911 pg/mL    Assessment/Plan: 1. Cervical neck pain with evidence of disc disease Positive history of stenosis per patient. No imaging to review. Symptoms seem related to intermittent cervical radiculopathy. Will check x-ray today. May need MRI.  - DG Cervical Spine Complete; Future  2. Right foot pain Doubt fracture but giving patient concerns will obtain x-ray to r/o stress fracture. Supportive measures -- RICE reviewed  with patient.  - DG Foot Complete Right; Future  3. Anxiety and depression Multiple drugs previously. Cymbalta now subtherapeutic. No alarm signs/symptoms.  Will check TSH and B12 today. If all good will attempt trial of Cymbalta 90. Follow-up scheduled with PCP. - TSH - B12  4. Chronic fatigue Suspect related to anxiety and depression. Will obtain lab panel today. Recommended counseling. Handout given.   - Sed Rate (ESR) - TSH - CBC - Iron - B12   Leeanne Rio, PA-C

## 2017-02-10 NOTE — Progress Notes (Signed)
Pre visit review using our clinic review tool, if applicable. No additional management support is needed unless otherwise documented below in the visit note. 

## 2017-02-10 NOTE — Patient Instructions (Signed)
Please go to the lab for blood work. I will call you with your results.  Please start the Flexeril at night. See if this helps with tension in the neck, shoulders and leg muscles.   You need to go to the [facility] office for imaging. I will call with these results.  Schedule a follow-up with Neurology. If labs unremarkable, we can consider trial on increased dose of Cymbalta.   Have work send over [employer] to [phone]. I will grant temporary intermittent FMLA while we are working on things. If anything long-term is needed, you will have to get from your primary provider or Neurology.  Follow-up will be based on results.

## 2017-02-11 ENCOUNTER — Encounter: Payer: Self-pay | Admitting: Physician Assistant

## 2017-02-11 ENCOUNTER — Other Ambulatory Visit: Payer: Self-pay | Admitting: Physician Assistant

## 2017-02-11 MED ORDER — DULOXETINE HCL 30 MG PO CPEP: 30.0000 mg | ORAL_CAPSULE | Freq: Every day | ORAL | 3 refills | Status: DC

## 2017-02-15 ENCOUNTER — Encounter: Payer: Self-pay | Admitting: Physician Assistant

## 2017-02-16 MED ORDER — GABAPENTIN 100 MG PO CAPS: 100.0000 mg | ORAL_CAPSULE | Freq: Two times a day (BID) | ORAL | 1 refills | Status: DC

## 2017-02-16 NOTE — Addendum Note (Signed)
Addended by: Brunetta Jeans on: 02/16/2017 03:47 PM   Modules accepted: Orders

## 2017-02-25 ENCOUNTER — Ambulatory Visit (INDEPENDENT_AMBULATORY_CARE_PROVIDER_SITE_OTHER): Payer: 59 | Admitting: Physician Assistant

## 2017-02-25 ENCOUNTER — Encounter: Payer: Self-pay | Admitting: Physician Assistant

## 2017-02-25 VITALS — BP 118/80 | HR 86 | Temp 98.0°F | Resp 16 | Ht 67.0 in | Wt 199.0 lb

## 2017-02-25 DIAGNOSIS — M5010 Cervical disc disorder with radiculopathy, unspecified cervical region: Secondary | ICD-10-CM | POA: Diagnosis not present

## 2017-02-25 MED ORDER — PREGABALIN 50 MG PO CAPS: 50.0000 mg | ORAL_CAPSULE | Freq: Two times a day (BID) | ORAL | 0 refills | Status: DC

## 2017-02-25 NOTE — Progress Notes (Signed)
Pre visit review using our clinic review tool, if applicable. No additional management support is needed unless otherwise documented below in the visit note. 

## 2017-02-25 NOTE — Progress Notes (Signed)
Patient presents to clinic today for follow-up. At last visit, patient was started on Flexeril for cervical neck pain. X-ray was obtained revealing "There is degenerative disc disease with disc space narrowing at C5-6. 2 mm anterolisthesis of C4 on C5. Diffuse facet arthritis in the cervical spine, most prominent at C4-5 on the right and at C3-4 on the left.There is moderate foraminal stenosis at C4-5 and C5-6 on the left." As such patient was started on Gabepentin 100 mg BID and a referral to Neurosurgery was placed.  Patient endorses attempting to take Gabapentin as directed but was unable to take more than once daily due to somnolence. Denies new or worsening symptoms. Is still have cervical neck pain with radiation into extremities and associated decreased strength.   Past Medical History:  Diagnosis Date  . Anxiety   . Arthritis   . Brain aneurysm   . Depression   . DVT (deep venous thrombosis) ([place])   . Endometriosis   . GERD (gastroesophageal reflux disease)    occ  . Headache   . Lacunar infarction ([place])   . PONV (postoperative nausea and vomiting)    extremely sick  . Seizures ([place])    tx 5 yrs in middle school  continuous  . Stroke ([place])    cva  14  . Tachycardia     Current Outpatient Prescriptions on File Prior to Visit  Medication Sig Dispense Refill  . ALPRAZolam (XANAX) 0.5 MG tablet Take 1 tablet (0.5 mg total) by mouth 2 (two) times daily as needed. for anxiety 60 tablet 0  . Cholecalciferol 2000 units CAPS Take 1 capsule (2,000 Units total) by mouth daily. 30 each 0  . co-enzyme Q-10 30 MG capsule Take 30 mg by mouth 3 (three) times daily.    . cyclobenzaprine (FLEXERIL) 5 MG tablet Take 1 tablet (5 mg total) by mouth at bedtime. 30 tablet 0  . dicyclomine (BENTYL) 20 MG tablet TAKE 1 TABLET(20 MG) BY MOUTH THREE TIMES DAILY AS NEEDED FOR SPASMS 60 tablet 6  . DULoxetine (CYMBALTA) 60 MG capsule TAKE 1 CAPSULE(60 MG) BY MOUTH DAILY 30 capsule 6  . magnesium  oxide (MAG-OX) 400 MG tablet Take 400 mg by mouth daily.    . metoprolol succinate (TOPROL-XL) 25 MG 24 hr tablet TAKE 1 TABLET(25 MG) BY MOUTH DAILY 30 tablet 6  . riboflavin (VITAMIN B-2) 100 MG TABS tablet Take 100 mg by mouth daily.    . Rivaroxaban (XARELTO) 15 MG TABS tablet Take 1/2 pill a day. 30 tablet 0   No current facility-administered medications on file prior to visit.     Allergies  Allergen Reactions  . Imitrex [Sumatriptan] Other (See Comments)    Increase heart rate and chest pain   . Dilaudid [Hydromorphone Hcl] Nausea And Vomiting  . Ace Inhibitors Other (See Comments)    REACTION: unknown  . Ondansetron Other (See Comments)    Constipation and depression  . Tramadol Other (See Comments)    Palpitations; Dizziness  . Codeine Nausea Only  . Latex Itching    Family History  Problem Relation Age of Onset  . Hypertension Mother   . Heart attack Father   . Hypertension Brother     2 out of 3 brothers  . Heart attack Brother     2 out of 3 brothers  . Diabetes Brother   . Asthma Sister   . Breast cancer Sister     Social History   Social History  .  Marital status: Divorced    Spouse name: N/A  . Number of children: N/A  . Years of education: N/A   Social History Main Topics  . Smoking status: Former Smoker    Packs/day: 0.00    Years: 30.00    Start date: 12/28/1983    Quit date: 05/26/2014  . Smokeless tobacco: Never Used     Comment: quit smoking 3 months ago  . Alcohol use 0.0 oz/week     Comment: socially  . Drug use: No  . Sexual activity: Not Asked   Other Topics Concern  . None   Social History Narrative  . None   Review of Systems - See HPI.  All other ROS are negative.  BP 118/80   Pulse 86   Temp 98 F (36.7 C) (Oral)   Resp 16   Ht '5\' 7"'  (1.702 m)   Wt 199 lb (90.3 kg)   LMP 12/28/2012   SpO2 98%   BMI 31.17 kg/m   Physical Exam  Constitutional: She is oriented to person, place, and time and well-developed,  well-nourished, and in no distress.  HENT:  Head: Normocephalic.  Eyes: Conjunctivae are normal.  Neck: Neck supple.  Cardiovascular: Normal rate, regular rhythm, normal heart sounds and intact distal pulses.   Pulmonary/Chest: Effort normal and breath sounds normal. No respiratory distress. She has no wheezes. She has no rales. She exhibits no tenderness.  Neurological: She is alert and oriented to person, place, and time.  Skin: Skin is warm and dry. No rash noted.  Psychiatric: Affect normal.  Vitals reviewed.  Recent Results (from the past 2160 hour(s))  Troponin I     Status: None   Collection Time: 01/02/17  8:48 PM  Result Value Ref Range   Troponin I <0.03 <0.03 ng/mL  Basic metabolic panel     Status: Abnormal   Collection Time: 01/02/17  8:48 PM  Result Value Ref Range   Sodium 138 135 - 145 mmol/L   Potassium 3.8 3.5 - 5.1 mmol/L   Chloride 104 101 - 111 mmol/L   CO2 26 22 - 32 mmol/L   Glucose, Bld 105 (H) 65 - 99 mg/dL   BUN 18 6 - 20 mg/dL   Creatinine, Ser 0.83 0.44 - 1.00 mg/dL   Calcium 9.3 8.9 - 10.3 mg/dL   GFR calc non Af Amer >60 >60 mL/min   GFR calc Af Amer >60 >60 mL/min    Comment: (NOTE) The eGFR has been calculated using the CKD EPI equation. This calculation has not been validated in all clinical situations. eGFR's persistently <60 mL/min signify possible Chronic Kidney Disease.    Anion gap 8 5 - 15  CBC with Differential     Status: None   Collection Time: 01/02/17  8:48 PM  Result Value Ref Range   WBC 8.9 4.0 - 10.5 K/uL   RBC 4.26 3.87 - 5.11 MIL/uL   Hemoglobin 13.3 12.0 - 15.0 g/dL   HCT 38.9 36.0 - 46.0 %   MCV 91.3 78.0 - 100.0 fL   MCH 31.2 26.0 - 34.0 pg   MCHC 34.2 30.0 - 36.0 g/dL   RDW 13.9 11.5 - 15.5 %   Platelets 274 150 - 400 K/uL   Neutrophils Relative % 45 %   Neutro Abs 3.9 1.7 - 7.7 K/uL   Lymphocytes Relative 44 %   Lymphs Abs 4.0 0.7 - 4.0 K/uL   Monocytes Relative 9 %   Monocytes Absolute 0.8 0.1 -  1.0 K/uL    Eosinophils Relative 2 %   Eosinophils Absolute 0.2 0.0 - 0.7 K/uL   Basophils Relative 0 %   Basophils Absolute 0.0 0.0 - 0.1 K/uL  Sed Rate (ESR)     Status: None   Collection Time: 02/10/17 11:56 AM  Result Value Ref Range   Sed Rate 12 0 - 30 mm/hr  TSH     Status: None   Collection Time: 02/10/17 11:56 AM  Result Value Ref Range   TSH 1.12 0.35 - 4.50 uIU/mL  CBC     Status: None   Collection Time: 02/10/17 11:56 AM  Result Value Ref Range   WBC 7.3 4.0 - 10.5 K/uL   RBC 4.48 3.87 - 5.11 Mil/uL   Platelets 242.0 150.0 - 400.0 K/uL   Hemoglobin 14.0 12.0 - 15.0 g/dL   HCT 41.3 36.0 - 46.0 %   MCV 92.2 78.0 - 100.0 fl   MCHC 33.9 30.0 - 36.0 g/dL   RDW 14.3 11.5 - 15.5 %  Iron     Status: None   Collection Time: 02/10/17 11:56 AM  Result Value Ref Range   Iron 81 42 - 145 ug/dL  B12     Status: None   Collection Time: 02/10/17 11:56 AM  Result Value Ref Range   Vitamin B-12 650 211 - 911 pg/mL    Assessment/Plan: 1. Cervical disc disorder with radiculopathy Order for MRI placed. Will check on status of Neurosurgery referral -- change to Urgent if possible. Will start a trial of Lyrica 50 mg BID. Follow-up scheduled. Alarm signs/symptoms reviewed with patient that would prompt immediate ER assessment.   - MR Cervical Spine Wo Contrast; Future - Ambulatory referral to Neurosurgery - pregabalin (LYRICA) 50 MG capsule; Take 1 capsule (50 mg total) by mouth 2 (two) times daily.  Dispense: 60 capsule; Refill: 0  Patient voiced understanding and agreement with the plan.  Leeanne Rio, PA-C

## 2017-02-25 NOTE — Patient Instructions (Addendum)
We are working on the Neurosurgery referral.  Please stop the Gabapentin and start the Lyrica as directed. We will titrate quickly if tolerating well. Tylenol for breakthrough pain. Continue Cymbalta.  I have ordered an MRI and we will get ASAP.

## 2017-03-06 ENCOUNTER — Ambulatory Visit (HOSPITAL_BASED_OUTPATIENT_CLINIC_OR_DEPARTMENT_OTHER)
Admission: RE | Admit: 2017-03-06 | Discharge: 2017-03-06 | Disposition: A | Discharge status: Home / Self Care | Payer: 59 | Source: Ambulatory Visit | Attending: Physician Assistant | Admitting: Physician Assistant

## 2017-03-06 DIAGNOSIS — M1288 Other specific arthropathies, not elsewhere classified, other specified site: Secondary | ICD-10-CM | POA: Insufficient documentation

## 2017-03-06 DIAGNOSIS — M4802 Spinal stenosis, cervical region: Secondary | ICD-10-CM | POA: Diagnosis not present

## 2017-03-06 DIAGNOSIS — M542 Cervicalgia: Secondary | ICD-10-CM | POA: Diagnosis not present

## 2017-03-06 DIAGNOSIS — M5010 Cervical disc disorder with radiculopathy, unspecified cervical region: Secondary | ICD-10-CM | POA: Insufficient documentation

## 2017-03-09 ENCOUNTER — Other Ambulatory Visit: Payer: Self-pay | Admitting: Family Medicine

## 2017-03-09 ENCOUNTER — Other Ambulatory Visit: Payer: Self-pay | Admitting: Hematology & Oncology

## 2017-03-10 ENCOUNTER — Encounter: Payer: Self-pay | Admitting: Physician Assistant

## 2017-03-12 NOTE — Telephone Encounter (Signed)
Responded via phone - see result note from MRI.

## 2017-03-23 ENCOUNTER — Other Ambulatory Visit: Payer: Self-pay | Admitting: Family Medicine

## 2017-03-24 ENCOUNTER — Ambulatory Visit: Payer: 59 | Attending: Neurosurgery | Admitting: Physical Therapy

## 2017-03-24 DIAGNOSIS — M5412 Radiculopathy, cervical region: Secondary | ICD-10-CM | POA: Insufficient documentation

## 2017-03-24 DIAGNOSIS — M6281 Muscle weakness (generalized): Secondary | ICD-10-CM | POA: Insufficient documentation

## 2017-03-24 DIAGNOSIS — M542 Cervicalgia: Secondary | ICD-10-CM | POA: Diagnosis not present

## 2017-03-24 DIAGNOSIS — R293 Abnormal posture: Secondary | ICD-10-CM | POA: Insufficient documentation

## 2017-03-24 NOTE — Therapy (Signed)
[facility] [address]  [place], [place], [address] Phone: [phone]   Fax:  [phone]  Physical Therapy Evaluation  Patient Details  Name: Melissa Morgan MRN: [id number] Date of Birth: January 20, 1966 Referring Provider: Marylyn Ishihara L. Christella Noa, MD  Encounter Date: 03/24/2017      PT End of Session - 03/24/17 1108    Visit Number 1   Number of Visits 12   Date for PT Re-Evaluation 05/07/17   Authorization Type [facility]    PT Start Time 1108  pt arrived late   PT Stop Time 1202   PT Time Calculation (min) 54 min   Activity Tolerance Patient tolerated treatment well;Patient limited by pain   Behavior During Therapy [facility] for tasks assessed/performed;Anxious      Past Medical History:  Diagnosis Date  . Anxiety   . Arthritis   . Brain aneurysm   . Depression   . DVT (deep venous thrombosis) ([place])   . Endometriosis   . GERD (gastroesophageal reflux disease)    occ  . Headache   . Lacunar infarction ([place])   . PONV (postoperative nausea and vomiting)    extremely sick  . Seizures ([place])    tx 5 yrs in middle school  continuous  . Stroke ([place])    cva  14  . Tachycardia     Past Surgical History:  Procedure Laterality Date  . CRANIOTOMY Right 07/06/2014   Procedure: CRANIOTOMY INTRACRANIAL ANEURYSM FOR CAROTID;  Surgeon: Ashok Pall, MD;  Location: [place] [facility];  Service: Neurosurgery;  Laterality: Right;  right   . lasar laparoscopy     for endometriosis x3    There were no vitals filed for this visit.       Subjective Assessment - 03/24/17 1110    Subjective Pt reports pain in her neck and her arm along with tingling in her R hand for a couple of months now. Pain makes pushing her cart at work difficult and limits many of her normal daily activities at home, including sleeping. Also noting headaches which are different than those she associates with prior craniotomy surgery. Pt notes some minor word finding  and STM deficits since craniotomy.   Pertinent History R Craniotomy for anuerysm clipping 06/2014, post-op blood clot on indefinite Xarelto   Limitations Sitting;House hold activities   How long can you sit comfortably? 5-10 minutes   Diagnostic tests Cervical x-ray 02/10/17: Degenerative disc disease at C5-6; Multilevel degenerative facet arthritis. Cervical MRI 03/06/17: 1. At C3-4 there is severe left facet arthropathy, Moderate left foraminal stenosis, Mild right foraminal stenosis; 2. At C4-5 there is a mild broad-based disc bulge, Moderate left facet arthropathy, Severe left foraminal stenosis, Mild right foraminal stenosis; 3. At C5-6 there is a broad-based disc bulge, Bilateral uncovertebral degenerative changes, Severe bilateral foraminal stenosis.   Patient Stated Goals "to be able to work w/ less pain and avoid surgery"   Currently in Pain? 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 Cabbell, MD   Onset Date/Surgical Date --  ~2 months   Hand Dominance Right   Next MD Visit ~3 weeks   Prior Therapy none     Balance Screen   Has the patient fallen in the past 6 months No   Has the patient had a decrease in activity level because of a  fear of falling?  No   Is the patient reluctant to leave their home because of a fear of falling?  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   Leisure walking 15/day daily, swimming during the summer     Observation/Other Assessments   Focus on Therapeutic Outcomes (FOTO)  Neck - 32% (68% limitation); predicted 54% (46% limitation)     Posture/Postural Control   Posture/Postural Control Postural limitations   Postural Limitations Forward head;Rounded Shoulders;Increased thoracic kyphosis   Posture Comments Neck shifted to R; protracted scapula R>L     ROM / Strength   AROM / PROM / Strength AROM;Strength     AROM   Overall AROM Comments pain/pinching in neck reported with B shoulder overhead elevation into flexion & abduction; no issues reported with B shoulder IR/ER   AROM Assessment Site Cervical;Shoulder   Right/Left Shoulder Right;Left   Right Shoulder Flexion 135 Degrees   Right Shoulder ABduction 136 Degrees   Right Shoulder Internal Rotation --  FIR to T12   Right Shoulder External Rotation --  FER to C7   Left Shoulder Flexion 139 Degrees   Left Shoulder ABduction 140 Degrees   Left Shoulder Internal Rotation --  FIR to T12   Left Shoulder External Rotation --  FER to C7   Cervical Flexion 49  pain   Cervical Extension 41  pinching/tightness   Cervical - Right Side Bend 20  pain, burning   Cervical - Left Side Bend 29  stiff   Cervical - Right Rotation 50  burning, pulling   Cervical - Left Rotation 64  "crunching"     Strength   Overall Strength Comments pain with all resisted R shoulder motions   Strength Assessment Site Shoulder;Hand    Right/Left Shoulder Right;Left   Right Shoulder Flexion 4-/5   Right Shoulder ABduction 4-/5   Right Shoulder Internal Rotation 4-/5   Right Shoulder External Rotation 4-/5   Left Shoulder Flexion 4/5   Left Shoulder ABduction 4/5   Left Shoulder Internal Rotation 4/5   Left Shoulder External Rotation 4/5   Right/Left hand Right;Left   Right Hand Grip (lbs) 33, 25, 38   Right Hand Lateral Pinch --  17, 16, 16 - pain at neck with squeeze   Right Hand 3 Point Pinch --  9, 12, 13   Left Hand Grip (lbs) 43, 45, 39  Left Hand Lateral Pinch --  17, 15, 15   Left Hand 3 Point Pinch --  15, 14, 14     Palpation   Palpation comment ttp with multiple TP's and increased muscle tension t/o pecs, UT, LS and cervical paraspinals, R>L     Special Tests    Special Tests Cervical   Cervical Tests Dictraction     Distraction Test   Comment decrease in pain & radicular symptoms                   [facility] Adult PT Treatment/Exercise - 03/24/17 1108      Self-Care   Self-Care Posture   Posture Provided education on neutral cervical spine and shoulder posture     Exercises   Exercises Neck     Neck Exercises: Seated   Neck Retraction 10 reps;5 secs   Other Seated Exercise B scapular retraction 10x5"     Neck Exercises: Stretches   Upper Trapezius Stretch 30 seconds;1 rep   Upper Trapezius Stretch Limitations seated with hand on edge of seat to anchor shoulder, no overpressure   Levator Stretch 30 seconds;1 rep   Levator Stretch Limitations seated with hand behind back to maintain neutral shoulder, no overpressure   Warehouse manager 30 seconds;3 reps   Corner Stretch Limitations 3 way doorway stretch - increased numbness and tingling noted with overhead position                PT Education - 03/24/17 1202    Education provided Yes   Education Details PT eval findings, anticipated POC, postural education for neutral spine and shoulder posture & initial HEP   Person(s)  Educated Patient   Methods Explanation;Demonstration;Verbal cues;Handout   Comprehension Verbalized understanding;Returned demonstration;Verbal cues required;Need further instruction             PT Long Term Goals - 03/24/17 1205      PT LONG TERM GOAL #1   Title Independent with HEP by 05/07/17   Status New     PT LONG TERM GOAL #2   Title Pt will routinely demonstrate neutral spine and shoulder posture w/o cues at least 75% of the time by 05/07/17   Status New     PT LONG TERM GOAL #3   Title Cervical ROM WFL w/o limitation due to pain or radicular symptoms by 05/07/17   Status New     PT LONG TERM GOAL #4   Title B shoulder strength >/= 4+/5 w/o pain or increased radicular symptoms by 05/07/17   Status New     PT LONG TERM GOAL #5   Title Pt will report </= 25% sleep disturbance due to neck pain or radicular symptoms by 05/07/17   Status New     PT LONG TERM GOAL #6   Title Pt will report ability to complete ADLs, light household chores and typical job tasks w/o increased neck pain or radicular symptoms by 05/07/17   Status New               Plan - 03/24/17 1202    Clinical Impression Statement Allan is a 51 y/o right hand dominant female who presents to OP PT for a moderately complex eval for cervical spondylosis with radiculopathy. Pt reports onset of neck pain ~2 months ago but states radicular numbness and tingling in R arm and hand has been present for longer undetermined amount of time. Also reports increased frequency of headaches which are different than her "usual" headaches  she has had since her R craniotomy for aneurysm clipping in 06/2014. Assessment revealed severe forward head and rounded shoulder posture with B scapula protracted and rounded forward along ribs and increased positional thoracic kyphosis. Cervical and B shoulder complex musculature demonstrates significantly increased muscle tension with several exceptionally taut muscle bands and trigger  points present. Cervical ROM limited in all planes due to combination of increased pain, pinching or burning sensations, muscle stiffness/tightness and arthritic changes which pt describes as "crunching". Pt also limited in B shoulder elevation in both flexion & abduction due to pain/pinching in neck. B shoulder and grips strengths weaker on R vs L despite R hand dominance. 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 Pt will benefit from skilled PT with POC focusing on postural and body mechanics training to improve functional alignment, stretching and manual therapy to improve cervical flexibility and soft tissue pliability in order to restore normal cervical ROM w/o pain, scapular stabilization and shoulder strengthening, with manual therapy and modalities PRN for pain. May also consider mechanical traction, taping and/or DN as indicated.   Rehab Potential Good   Clinical Impairments Affecting Rehab Potential R Craniotomy for anuerysm clipping 06/2014, post-op blood clot on indefinite Xarelto; anxiety; depression; h/o seizures & CVA   PT Frequency 2x / week   PT Duration 6 weeks   PT Treatment/Interventions Patient/family education;Neuromuscular re-education;Therapeutic exercise;Therapeutic activities;Functional mobility training;Manual techniques;Dry needling;Taping;Electrical Stimulation;Moist Heat;Cryotherapy;Traction;ADLs/Self Care Home Management   PT Next Visit Plan Review initial HEP, posture and body mechanics training; manual therapy to address muscular tightness & TP's; anterior stretching & scapular strengthening/stabilization for neutralization of posture; gentle cervical ROM/stretching; consider modalities +/- mechanical traction as indicated for pain & radicular symptoms   Consulted and Agree with Plan of Care Patient      Patient will  benefit from skilled therapeutic intervention in order to improve the following deficits and impairments:  Pain, Increased muscle spasms, Impaired sensation, Postural dysfunction, Improper body mechanics, Decreased range of motion, Decreased strength, Decreased activity tolerance, Impaired UE functional use  Visit Diagnosis: Radiculopathy, cervical region  Cervicalgia  Abnormal posture  Muscle weakness (generalized)     Problem List Patient Active Problem List   Diagnosis Date Noted  . Fatigue 08/06/2016  . Loose stools 03/02/2016  . Diffuse abdominal pain 03/02/2016  . Acute frontal sinusitis 11/13/2015  . Chronic daily headache 04/14/2015  . Aneurysm, cerebral, nonruptured 02/12/2015  . Worsening headaches 02/12/2015  . Breast pain, right 12/19/2014  . Polyarthralgia 12/19/2014  . Chest pain 10/01/2014  . Nausea without vomiting 10/01/2014  . GERD (gastroesophageal reflux disease) 07/30/2014  . DVT (deep venous thrombosis) ([place]) 07/23/2014  . Cerebral aneurysm without rupture 07/06/2014  . Depression with anxiety 03/23/2014    Percival Spanish, PT, MPT 03/24/2017, 1:01 PM  [facility] [address]  [place], [place], [address] Phone: [phone]   Fax:  [phone]  Name: CASSY SPROWL MRN: [id number] Date of Birth: 1966-03-31

## 2017-03-24 NOTE — Telephone Encounter (Signed)
Medication filled to pharmacy as requested.   

## 2017-03-24 NOTE — Telephone Encounter (Signed)
Last OV 08/06/16 Alprazolam last filled 01/26/17 #60 with 0

## 2017-03-31 ENCOUNTER — Ambulatory Visit: Payer: 59

## 2017-04-01 ENCOUNTER — Ambulatory Visit: Payer: 59

## 2017-04-01 DIAGNOSIS — M5412 Radiculopathy, cervical region: Secondary | ICD-10-CM | POA: Diagnosis not present

## 2017-04-01 DIAGNOSIS — M542 Cervicalgia: Secondary | ICD-10-CM | POA: Diagnosis not present

## 2017-04-01 DIAGNOSIS — R293 Abnormal posture: Secondary | ICD-10-CM

## 2017-04-01 DIAGNOSIS — M6281 Muscle weakness (generalized): Secondary | ICD-10-CM | POA: Diagnosis not present

## 2017-04-01 NOTE — Therapy (Signed)
[facility] [address]  [place], [place], [address] Phone: [phone]   Fax:  [phone]  Physical Therapy Treatment  Patient Details  Name: Melissa Morgan MRN: [id number] Date of Birth: 18-Jan-1966 Referring Provider: Marylyn Ishihara L. Christella Noa, MD  Encounter Date: 04/01/2017      PT End of Session - 04/01/17 0935    Visit Number 2   Number of Visits 12   Date for PT Re-Evaluation 05/07/17   Authorization Type [facility]    PT Start Time 0931   PT Stop Time 1025   PT Time Calculation (min) 54 min   Activity Tolerance Patient tolerated treatment well;Patient limited by pain   Behavior During Therapy [facility] for tasks assessed/performed;Anxious      Past Medical History:  Diagnosis Date  . Anxiety   . Arthritis   . Brain aneurysm   . Depression   . DVT (deep venous thrombosis) ([place])   . Endometriosis   . GERD (gastroesophageal reflux disease)    occ  . Headache   . Lacunar infarction ([place])   . PONV (postoperative nausea and vomiting)    extremely sick  . Seizures ([place])    tx 5 yrs in middle school  continuous  . Stroke ([place])    cva  14  . Tachycardia     Past Surgical History:  Procedure Laterality Date  . CRANIOTOMY Right 07/06/2014   Procedure: CRANIOTOMY INTRACRANIAL ANEURYSM FOR CAROTID;  Surgeon: Ashok Pall, MD;  Location: [place] [facility];  Service: Neurosurgery;  Laterality: Right;  right   . lasar laparoscopy     for endometriosis x3    There were no vitals filed for this visit.      Subjective Assessment - 04/01/17 0933    Subjective Pt. hasn't had trouble with HEP however admitting to inconsistent performance.    Patient Stated Goals "to be able to work w/ less pain and avoid surgery"   Currently in Pain? Yes   Pain Score 3    Pain Location Arm   Pain Orientation Right   Pain Descriptors / Indicators Burning   Pain Radiating Towards radicular pain into R upper arm and hand   Pain Onset More than  a month ago   Pain Frequency Constant   Multiple Pain Sites No                         [facility] Adult PT Treatment/Exercise - 04/01/17 1004      Neck Exercises: Machines for Strengthening   UBE (Upper Arm Bike) UBE: lvl 1.5, 3 min each way     Neck Exercises: Theraband   Horizontal ABduction 10 reps;Red   Horizontal ABduction Limitations laying on 1/2 foam bolster      Neck Exercises: Standing   Neck Retraction --     Neck Exercises: Seated   Neck Retraction 10 reps;5 secs   Other Seated Exercise B scapular retraction 10x5"     Modalities   Modalities Traction;Moist Heat     Moist Heat Therapy   Number Minutes Moist Heat 10 Minutes   Moist Heat Location --  upper back      Traction   Type of Traction Cervical   Min (lbs) 7   Max (lbs) 11   Hold Time 60   Rest Time 20   Time 10     Manual Therapy   Manual Therapy Soft tissue mobilization;Myofascial release;Manual Traction;Passive ROM  Soft tissue mobilization STM to B UT, LS, cervical paraspinals   Myofascial Release R medial scapular border, and R UT    Passive ROM Supine cervical PROM all directions with B UT, LS stretches with gentle overpressure    Manual Traction cervical traction x 2 min; decrease in pain and radicular symptoms     Neck Exercises: Stretches   Upper Trapezius Stretch 30 seconds;1 rep   Upper Trapezius Stretch Limitations seated with hand on edge of seat to anchor shoulder, no overpressure   Levator Stretch 30 seconds;1 rep   Levator Stretch Limitations seated with hand behind back to maintain neutral shoulder, no overpressure   Warehouse manager 30 seconds;3 reps   Corner Stretch Limitations 3 way doorway stretch - increased numbness and tingling noted with overhead position  some R UE pain with upper version thus terminated                      PT Long Term Goals - 04/01/17 0945      PT LONG TERM GOAL #1   Title Independent with HEP by 05/07/17   Status On-going      PT LONG TERM GOAL #2   Title Pt will routinely demonstrate neutral spine and shoulder posture w/o cues at least 75% of the time by 05/07/17   Status On-going     PT LONG TERM GOAL #3   Title Cervical ROM WFL w/o limitation due to pain or radicular symptoms by 05/07/17   Status On-going     PT LONG TERM GOAL #4   Title B shoulder strength >/= 4+/5 w/o pain or increased radicular symptoms by 05/07/17   Status On-going     PT LONG TERM GOAL #5   Title Pt will report </= 25% sleep disturbance due to neck pain or radicular symptoms by 05/07/17   Status On-going     PT LONG TERM GOAL #6   Title Pt will report ability to complete ADLs, light household chores and typical job tasks w/o increased neck pain or radicular symptoms by 05/07/17   Status On-going               Plan - 04/01/17 1001    Clinical Impression Statement Pt. doing well today.  HEP reviewed with pt. today with pt. requiring mod cueing for proper technique.  Some neck pain and increase in radicular symptoms with high chest stretch on door seal.  Pt. instructed to skip this activity until further notice.  Some complaint of R UE radicular symptoms today, which were relieved by manual cervical traction thus trial of mechanical traction at 11#/7# today.  Pt. with some report of decreased pain following mechanical traction however, inconclusive whether radicular symptoms decreased.  Will monitor pt. response to traction next visit.   PT Treatment/Interventions Patient/family education;Neuromuscular re-education;Therapeutic exercise;Therapeutic activities;Functional mobility training;Manual techniques;Dry needling;Taping;Electrical Stimulation;Moist Heat;Cryotherapy;Traction;ADLs/Self Care Home Management   PT Next Visit Plan Posture and body mechanics training; manual therapy to address muscular tightness & TP's; anterior stretching & scapular strengthening/stabilization for neutralization of posture; gentle cervical  ROM/stretching; consider modalities +/- mechanical traction as indicated for pain & radicular symptoms      Patient will benefit from skilled therapeutic intervention in order to improve the following deficits and impairments:  Pain, Increased muscle spasms, Impaired sensation, Postural dysfunction, Improper body mechanics, Decreased range of motion, Decreased strength, Decreased activity tolerance, Impaired UE functional use  Visit Diagnosis: Radiculopathy, cervical region  Cervicalgia  Abnormal posture  Muscle  weakness (generalized)     Problem List Patient Active Problem List   Diagnosis Date Noted  . Fatigue 08/06/2016  . Loose stools 03/02/2016  . Diffuse abdominal pain 03/02/2016  . Acute frontal sinusitis 11/13/2015  . Chronic daily headache 04/14/2015  . Aneurysm, cerebral, nonruptured 02/12/2015  . Worsening headaches 02/12/2015  . Breast pain, right 12/19/2014  . Polyarthralgia 12/19/2014  . Chest pain 10/01/2014  . Nausea without vomiting 10/01/2014  . GERD (gastroesophageal reflux disease) 07/30/2014  . DVT (deep venous thrombosis) ([place]) 07/23/2014  . Cerebral aneurysm without rupture 07/06/2014  . Depression with anxiety 03/23/2014    Bess Harvest, PTA 04/01/17 1:12 PM   [facility] [address]  [place], [place], [address] Phone: [phone]   Fax:  [phone]  Name: MOUNA YAGER MRN: [id number] Date of Birth: 01-31-1966

## 2017-04-12 ENCOUNTER — Other Ambulatory Visit: Payer: Self-pay | Admitting: Family Medicine

## 2017-04-14 ENCOUNTER — Ambulatory Visit (INDEPENDENT_AMBULATORY_CARE_PROVIDER_SITE_OTHER): Payer: 59 | Admitting: Psychology

## 2017-04-14 DIAGNOSIS — F331 Major depressive disorder, recurrent, moderate: Secondary | ICD-10-CM

## 2017-04-17 IMAGING — DX DG CERVICAL SPINE COMPLETE 4+V
5 series · 5 of 5 positions shown · non-contrast
Comparison: None.

CLINICAL DATA: Neck pain with intermittent radicular symptoms.

EXAM:
CERVICAL SPINE - COMPLETE 4+ VIEW

[cervical spine lat]
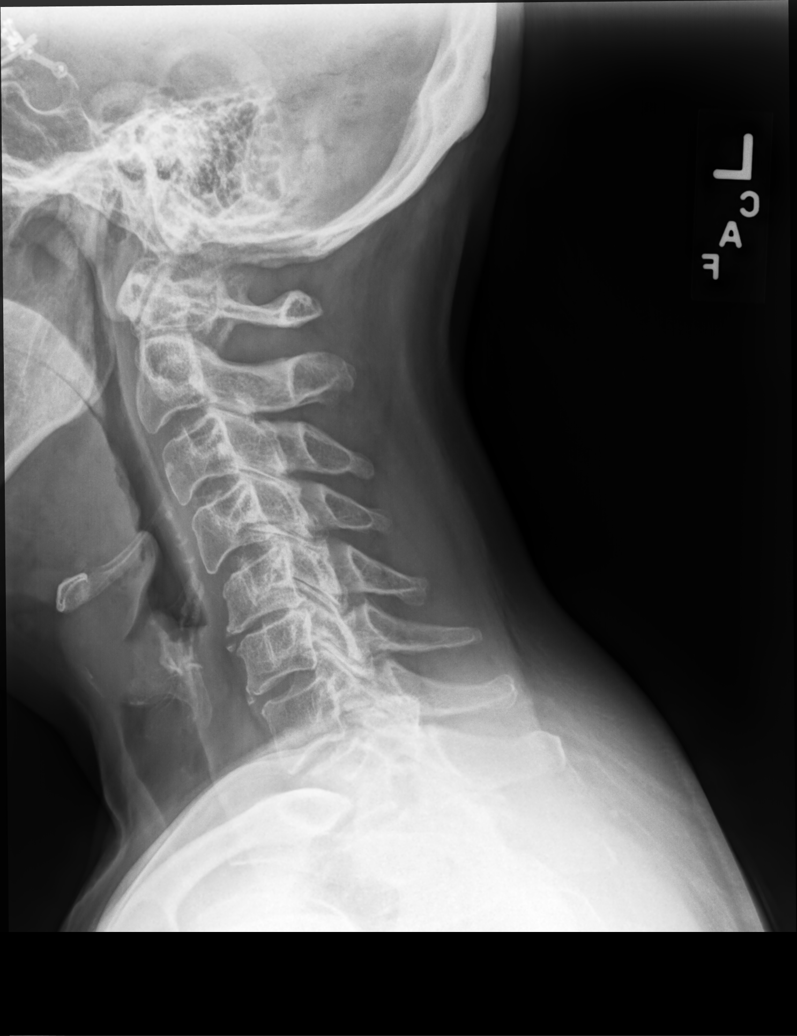

[cervical spine oblique (1 of 2)]
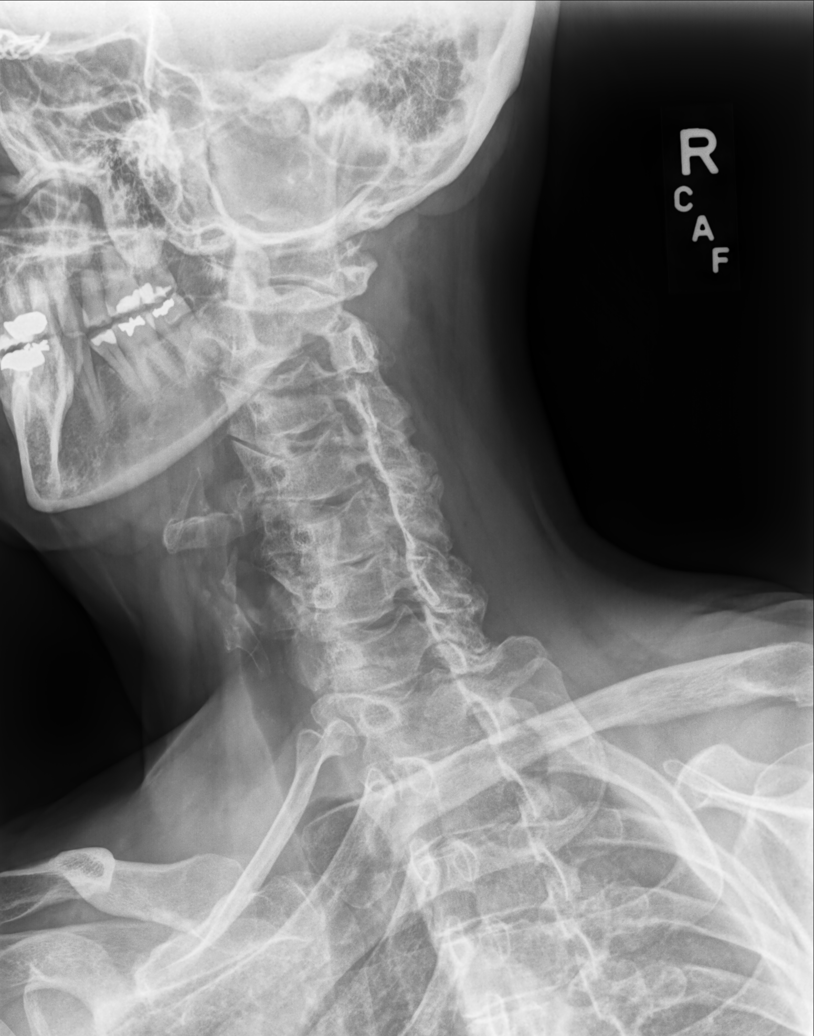

[cervical spine oblique (2 of 2)]
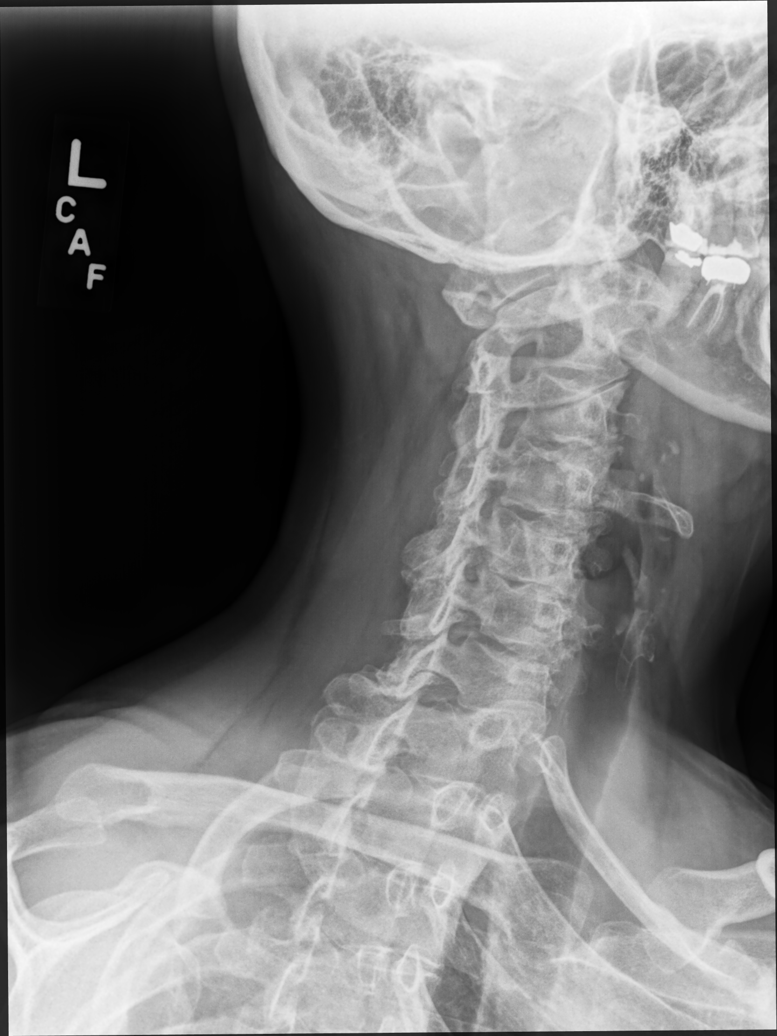

[cervical spine ap]
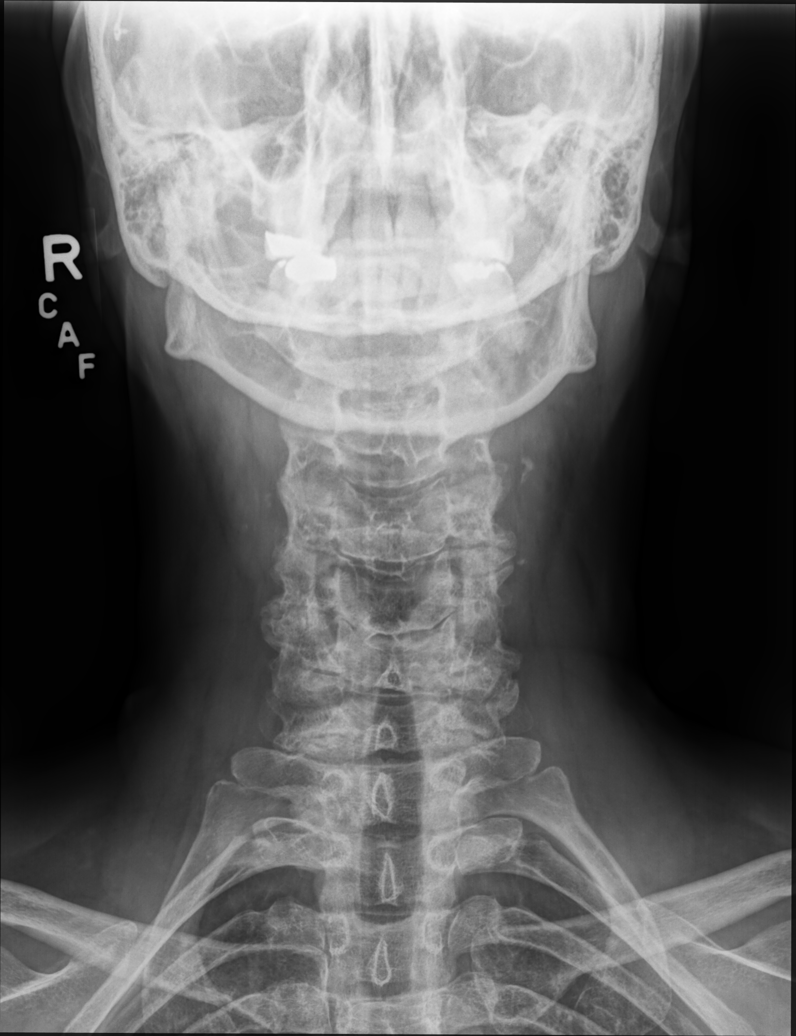

[odontoid]
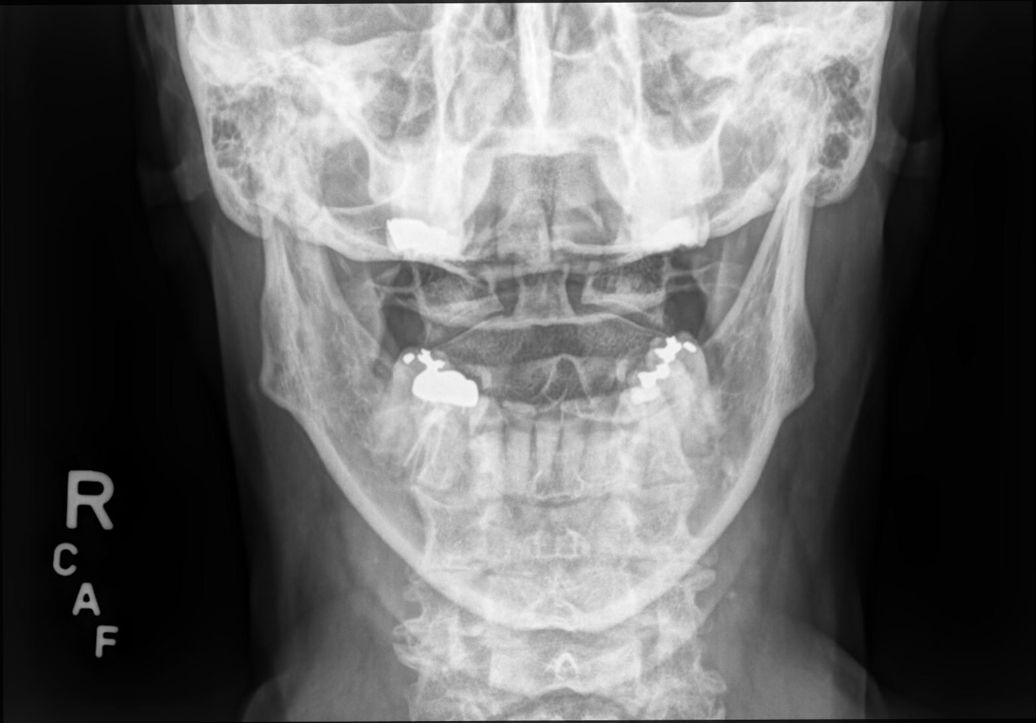

[5 of 5 positions shown; findings below may reference images not displayed]

FINDINGS: There is degenerative disc disease with disc space narrowing at
C5-6. 2 mm anterolisthesis of C4 on C5. Diffuse facet arthritis in
the cervical spine, most prominent at C4-5 on the right and at C3-4
on the left.

There is moderate foraminal stenosis at C4-5 and C5-6 on the left.

Prevertebral soft tissues are normal.  No bone destruction.
IMPRESSION: Degenerative disc disease at C5-6. Multilevel degenerative facet
arthritis.

## 2017-04-21 ENCOUNTER — Ambulatory Visit: Payer: 59 | Attending: Neurosurgery | Admitting: Physical Therapy

## 2017-04-21 DIAGNOSIS — M542 Cervicalgia: Secondary | ICD-10-CM | POA: Insufficient documentation

## 2017-04-21 DIAGNOSIS — R293 Abnormal posture: Secondary | ICD-10-CM | POA: Diagnosis not present

## 2017-04-21 DIAGNOSIS — M6281 Muscle weakness (generalized): Secondary | ICD-10-CM | POA: Insufficient documentation

## 2017-04-21 DIAGNOSIS — M5412 Radiculopathy, cervical region: Secondary | ICD-10-CM | POA: Insufficient documentation

## 2017-04-21 NOTE — Therapy (Signed)
[facility] [address]  [place], [place], [address] Phone: [phone]   Fax:  [phone]  Physical Therapy Treatment  Patient Details  Name: Melissa Morgan MRN: [id number] Date of Birth: 12-19-1965 Referring Provider: Marylyn Ishihara L. Christella Noa, MD  Encounter Date: 04/21/2017      PT End of Session - 04/21/17 1145    Visit Number 3   Number of Visits 12   Date for PT Re-Evaluation 05/07/17   Authorization Type [facility]    PT Start Time 3474   PT Stop Time 1254   PT Time Calculation (min) 69 min   Activity Tolerance Patient tolerated treatment well;Patient limited by pain   Behavior During Therapy [facility] for tasks assessed/performed;Anxious      Past Medical History:  Diagnosis Date  . Anxiety   . Arthritis   . Brain aneurysm   . Depression   . DVT (deep venous thrombosis) ([place])   . Endometriosis   . GERD (gastroesophageal reflux disease)    occ  . Headache   . Lacunar infarction ([place])   . PONV (postoperative nausea and vomiting)    extremely sick  . Seizures (Home)    tx 5 yrs in middle school  continuous  . Stroke ([place])    cva  14  . Tachycardia     Past Surgical History:  Procedure Laterality Date  . CRANIOTOMY Right 07/06/2014   Procedure: CRANIOTOMY INTRACRANIAL ANEURYSM FOR CAROTID;  Surgeon: Ashok Pall, MD;  Location: [place] [facility];  Service: Neurosurgery;  Laterality: Right;  right   . lasar laparoscopy     for endometriosis x3    There were no vitals filed for this visit.      Subjective Assessment - 04/21/17 1148    Subjective Pt has missed last appt due to busy work schedule. Notes pain typically worst at work and now even having low back pain with LE radiculopathy.   Pertinent History R Craniotomy for anuerysm clipping 06/2014, post-op blood clot on indefinite Xarelto   Patient Stated Goals "to be able to work w/ less pain and avoid surgery"   Currently in Pain? Yes   Pain Score 4    Pain  Location Arm  & neck   Pain Orientation Right   Pain Descriptors / Indicators Burning;Tingling;Numbness   Pain Radiating Towards numbness & tingling into R hand   Pain Onset More than a month ago   Pain Frequency Constant   Aggravating Factors  pushing phlebotomy cart at work   Pain Relieving Factors exercises, Tylenol, ice or heat   Effect of Pain on Daily Activities limits performance at work and ADLs at home, interferes with sleep                         [facility] Adult PT Treatment/Exercise - 04/21/17 1145      Self-Care   Self-Care Posture   Posture handout provided and reviewed focusing on posture awareness and proper body mechanics during typically daily tasks including work tasks such as set up of wrok station and pushing phlebotomy cart     Neck Exercises: Machines for Strengthening   UBE (Upper Arm Bike) lvl 2.0 fwd/back x 3' each     Neck Exercises: Seated   Neck Retraction 15 reps;5 reps   Other Seated Exercise B scapular retraction 15x5"     Hand Exercises for Cervical Radiculopathy   Other Hand Exercise for Cervical Radiculopathy Supine  R median nerve glide x10   Other Hand Exercise for Cervical Radiculopathy Seated brachial plexus nerve glide x10     Modalities   Modalities Electrical Stimulation;Moist Heat     Moist Heat Therapy   Number Minutes Moist Heat 15 Minutes   Moist Heat Location Cervical;Shoulder     Electrical Stimulation   Electrical Stimulation Location cervical & B UT/scapular region   Electrical Stimulation Action IFC   Electrical Stimulation Parameters to pt tolerance x15'   Electrical Stimulation Goals Pain;Tone     Manual Therapy   Manual Therapy Soft tissue mobilization;Manual Traction;Neural Stretch;Passive ROM;Myofascial release   Manual therapy comments pt supine   Soft tissue mobilization STM to B UT, LS, cervical paraspinals & pecs   Myofascial Release TPR to L pecs & R UT; subocciptal release   Passive ROM cervical  PROM all directios with gentle stretch   Manual Traction cervical distraction in slight flexion 30" x5   Neural Stretch manual R median nerve glide     Neck Exercises: Stretches   Upper Trapezius Stretch 30 seconds;2 reps   Upper Trapezius Stretch Limitations seated with hand on edge of seat to anchor shoulder, no overpressure   Levator Stretch 30 seconds;2 reps   Levator Stretch Limitations seated with hand behind back to maintain neutral shoulder, no overpressure   Warehouse manager 30 seconds;3 reps   Corner Stretch Limitations 3 way doorway stretch - increased numbness and tingling noted with overhead position, therefore instrtucted pt to reduce pressure on doorframe with lessening of symptoms                PT Education - 04/21/17 1250    Education Details Posture & body mechanics education; UE nerve glides   Person(s) Educated Patient   Methods Explanation;Demonstration;Handout;Verbal cues;Tactile cues   Comprehension Verbalized understanding;Returned demonstration;Verbal cues required;Tactile cues required;Need further instruction             PT Long Term Goals - 04/01/17 0945      PT LONG TERM GOAL #1   Title Independent with HEP by 05/07/17   Status On-going     PT LONG TERM GOAL #2   Title Pt will routinely demonstrate neutral spine and shoulder posture w/o cues at least 75% of the time by 05/07/17   Status On-going     PT LONG TERM GOAL #3   Title Cervical ROM WFL w/o limitation due to pain or radicular symptoms by 05/07/17   Status On-going     PT LONG TERM GOAL #4   Title B shoulder strength >/= 4+/5 w/o pain or increased radicular symptoms by 05/07/17   Status On-going     PT LONG TERM GOAL #5   Title Pt will report </= 25% sleep disturbance due to neck pain or radicular symptoms by 05/07/17   Status On-going     PT LONG TERM GOAL #6   Title Pt will report ability to complete ADLs, light household chores and typical job tasks w/o increased neck pain or  radicular symptoms by 05/07/17   Status On-going               Plan - 04/21/17 1154    Clinical Impression Statement Pt returning after nearly 3 week absence from PT. Reporting daily completion of HEP but upon review required correction of technique for nearly all exercises. Pt reporting pain typically worst when at work pusing her phlebotomy cart, therefore provided instruction in posture and body mechaincs for typical daily tasks and  simulated work tasks. Pt persists with forward head and shoulder posture with increased muscle tension and TP's in B UT, LS and pecs, therefore targeted STM and MFR to these areas followed by IFC estim and moist heat to promote further muscle relaxation.   Rehab Potential Good   Clinical Impairments Affecting Rehab Potential R Craniotomy for anuerysm clipping 06/2014, post-op blood clot on indefinite Xarelto; anxiety; depression; h/o seizures & CVA   PT Treatment/Interventions Patient/family education;Neuromuscular re-education;Therapeutic exercise;Therapeutic activities;Functional mobility training;Manual techniques;Dry needling;Taping;Electrical Stimulation;Moist Heat;Cryotherapy;Traction;ADLs/Self Care Home Management   PT Next Visit Plan Posture and body mechanics training; manual therapy to address muscular tightness & TP's; anterior stretching & scapular strengthening/stabilization for neutralization of posture; gentle cervical ROM/stretching; consider modalities +/- mechanical traction as indicated for pain & radicular symptoms   Consulted and Agree with Plan of Care Patient      Patient will benefit from skilled therapeutic intervention in order to improve the following deficits and impairments:  Pain, Increased muscle spasms, Impaired sensation, Postural dysfunction, Improper body mechanics, Decreased range of motion, Decreased strength, Decreased activity tolerance, Impaired UE functional use  Visit Diagnosis: Radiculopathy, cervical  region  Cervicalgia  Abnormal posture  Muscle weakness (generalized)     Problem List Patient Active Problem List   Diagnosis Date Noted  . Fatigue 08/06/2016  . Loose stools 03/02/2016  . Diffuse abdominal pain 03/02/2016  . Acute frontal sinusitis 11/13/2015  . Chronic daily headache 04/14/2015  . Aneurysm, cerebral, nonruptured 02/12/2015  . Worsening headaches 02/12/2015  . Breast pain, right 12/19/2014  . Polyarthralgia 12/19/2014  . Chest pain 10/01/2014  . Nausea without vomiting 10/01/2014  . GERD (gastroesophageal reflux disease) 07/30/2014  . DVT (deep venous thrombosis) ([place]) 07/23/2014  . Cerebral aneurysm without rupture 07/06/2014  . Depression with anxiety 03/23/2014    Percival Spanish, PT, MPT 04/21/2017, 1:11 PM  [facility] [address]  [place], [place], [address] Phone: [phone]   Fax:  [phone]  Name: Melissa Morgan MRN: [id number] Date of Birth: [birth year]

## 2017-04-21 NOTE — Patient Instructions (Addendum)

## 2017-04-28 ENCOUNTER — Encounter: Payer: Self-pay | Admitting: Physician Assistant

## 2017-04-28 ENCOUNTER — Encounter (HOSPITAL_BASED_OUTPATIENT_CLINIC_OR_DEPARTMENT_OTHER): Payer: Self-pay

## 2017-04-28 ENCOUNTER — Ambulatory Visit: Payer: 59 | Admitting: Physical Therapy

## 2017-04-28 ENCOUNTER — Emergency Department (HOSPITAL_BASED_OUTPATIENT_CLINIC_OR_DEPARTMENT_OTHER): Payer: 59

## 2017-04-28 ENCOUNTER — Emergency Department (HOSPITAL_BASED_OUTPATIENT_CLINIC_OR_DEPARTMENT_OTHER)
Admission: EM | Admit: 2017-04-28 | Discharge: 2017-04-29 | Disposition: A | Discharge status: Home / Self Care | Payer: 59 | Attending: Emergency Medicine | Admitting: Emergency Medicine

## 2017-04-28 DIAGNOSIS — R51 Headache: Secondary | ICD-10-CM | POA: Diagnosis not present

## 2017-04-28 DIAGNOSIS — R519 Headache, unspecified: Secondary | ICD-10-CM

## 2017-04-28 DIAGNOSIS — M6281 Muscle weakness (generalized): Secondary | ICD-10-CM

## 2017-04-28 DIAGNOSIS — M5412 Radiculopathy, cervical region: Secondary | ICD-10-CM

## 2017-04-28 DIAGNOSIS — M542 Cervicalgia: Secondary | ICD-10-CM

## 2017-04-28 DIAGNOSIS — Z91040 Latex allergy status: Secondary | ICD-10-CM | POA: Diagnosis not present

## 2017-04-28 DIAGNOSIS — R293 Abnormal posture: Secondary | ICD-10-CM

## 2017-04-28 DIAGNOSIS — Z87891 Personal history of nicotine dependence: Secondary | ICD-10-CM | POA: Diagnosis not present

## 2017-04-28 DIAGNOSIS — Z7901 Long term (current) use of anticoagulants: Secondary | ICD-10-CM | POA: Insufficient documentation

## 2017-04-28 DIAGNOSIS — Z79899 Other long term (current) drug therapy: Secondary | ICD-10-CM | POA: Diagnosis not present

## 2017-04-28 LAB — CBC WITH DIFFERENTIAL/PLATELET
BASOS ABS: 0.1 10*3/uL (ref 0.0–0.1)
BASOS PCT: 1 %
EOS ABS: 0.2 10*3/uL (ref 0.0–0.7)
Eosinophils Relative: 3 %
HCT: 43.4 % (ref 36.0–46.0)
Hemoglobin: 14.8 g/dL (ref 12.0–15.0)
Lymphocytes Relative: 46 %
Lymphs Abs: 3.7 10*3/uL (ref 0.7–4.0)
MCH: 31.3 pg (ref 26.0–34.0)
MCHC: 34.1 g/dL (ref 30.0–36.0)
MCV: 91.8 fL (ref 78.0–100.0)
MONO ABS: 0.6 10*3/uL (ref 0.1–1.0)
MONOS PCT: 7 %
NEUTROS PCT: 43 %
Neutro Abs: 3.5 10*3/uL (ref 1.7–7.7)
PLATELETS: 242 10*3/uL (ref 150–400)
RBC: 4.73 MIL/uL (ref 3.87–5.11)
RDW: 13.7 % (ref 11.5–15.5)
WBC: 8.1 10*3/uL (ref 4.0–10.5)

## 2017-04-28 LAB — URINALYSIS, ROUTINE W REFLEX MICROSCOPIC
Bilirubin Urine: NEGATIVE
Glucose, UA: NEGATIVE mg/dL
Hgb urine dipstick: NEGATIVE
KETONES UR: NEGATIVE mg/dL
Nitrite: NEGATIVE
PH: 5.5 (ref 5.0–8.0)
PROTEIN: NEGATIVE mg/dL
Specific Gravity, Urine: 1.023 (ref 1.005–1.030)

## 2017-04-28 LAB — URINALYSIS, MICROSCOPIC (REFLEX)

## 2017-04-28 LAB — BASIC METABOLIC PANEL
ANION GAP: 10 (ref 5–15)
BUN: 13 mg/dL (ref 6–20)
CALCIUM: 9.3 mg/dL (ref 8.9–10.3)
CO2: 26 mmol/L (ref 22–32)
CREATININE: 0.77 mg/dL (ref 0.44–1.00)
Chloride: 103 mmol/L (ref 101–111)
GFR calc Af Amer: >60 mL/min (ref >60)
Glucose, Bld: 176 mg/dL — ABNORMAL HIGH (ref 65–99)
Potassium: 3.5 mmol/L (ref 3.5–5.1)
SODIUM: 139 mmol/L (ref 135–145)

## 2017-04-28 MED ORDER — DIPHENHYDRAMINE HCL 50 MG/ML IJ SOLN
25.0000 mg | Freq: Once | INTRAMUSCULAR | Status: AC
  Administered 2017-04-28: 25 mg via INTRAVENOUS
  Filled 2017-04-28: qty 1

## 2017-04-28 MED ORDER — METOCLOPRAMIDE HCL 5 MG/ML IJ SOLN
10.0000 mg | Freq: Once | INTRAMUSCULAR | Status: AC
  Administered 2017-04-28: 10 mg via INTRAVENOUS
  Filled 2017-04-28: qty 2

## 2017-04-28 NOTE — Patient Instructions (Signed)

## 2017-04-28 NOTE — ED Triage Notes (Signed)
C/o dizziness since 4pm, nausea and "feeling foggy headed" x today-HA started yesterday-NAD-steady gait

## 2017-04-28 NOTE — Therapy (Signed)
[facility] [address]  [place], [place], [address] Phone: [phone]   Fax:  [phone]  Physical Therapy Treatment  Patient Details  Name: Melissa Morgan MRN: [id number] Date of Birth: 09-10-1966 Referring Provider: Marylyn Ishihara L. Christella Noa, MD  Encounter Date: 04/28/2017      PT End of Session - 04/28/17 0800    Visit Number 4   Number of Visits 12   Date for PT Re-Evaluation 05/07/17   Authorization Type [facility]    PT Start Time 0800   PT Stop Time 0901   PT Time Calculation (min) 61 min   Activity Tolerance Patient tolerated treatment well   Behavior During Therapy [facility] for tasks assessed/performed      Past Medical History:  Diagnosis Date  . Anxiety   . Arthritis   . Brain aneurysm   . Depression   . DVT (deep venous thrombosis) ([place])   . Endometriosis   . GERD (gastroesophageal reflux disease)    occ  . Headache   . Lacunar infarction ([place])   . PONV (postoperative nausea and vomiting)    extremely sick  . Seizures ([place])    tx 5 yrs in middle school  continuous  . Stroke ([place])    cva  14  . Tachycardia     Past Surgical History:  Procedure Laterality Date  . CRANIOTOMY Right 07/06/2014   Procedure: CRANIOTOMY INTRACRANIAL ANEURYSM FOR CAROTID;  Surgeon: Ashok Pall, MD;  Location: [place] [facility];  Service: Neurosurgery;  Laterality: Right;  right   . lasar laparoscopy     for endometriosis x3    There were no vitals filed for this visit.      Subjective Assessment - 04/28/17 0808    Subjective Pt reporting "miserable day" at work yesterday and c/o increased neck and LBP. Neck pain more on L side today but no radicular symptoms. Reports completing HEP daily at home and work.   Pertinent History R Craniotomy for anuerysm clipping 06/2014, post-op blood clot on indefinite Xarelto   Patient Stated Goals "to be able to work w/ less pain and avoid surgery"   Currently in Pain? Yes   Pain Score 4    Pain Location Neck   Pain Orientation Lower;Left   Pain Descriptors / Indicators Burning   Pain Type Acute pain                         [facility] Adult PT Treatment/Exercise - 04/28/17 0800      Neck Exercises: Machines for Strengthening   UBE (Upper Arm Bike) lvl 2.0 fwd/back x 3' each     Manual Therapy   Manual Therapy Soft tissue mobilization;Myofascial release;Manual Traction;Passive ROM;Joint mobilization   Manual therapy comments pt supine and prone   Joint Mobilization CPA & L UPA and lateral glides to C3 & C4   Soft tissue mobilization STM to B UT, LS, SCM, scalenes, cervical paraspinals & pecs   Myofascial Release TPR to L UT & LS   Passive ROM cervical PROM all directios with gentle stretch    Manual Traction cervical distraction in slight flexion 30" x3 - limited tolerance today     Neck Exercises: Stretches   Upper Trapezius Stretch 30 seconds;2 reps   Upper Trapezius Stretch Limitations manual   Levator Stretch 30 seconds;2 reps   Levator Stretch Limitations manual          Trigger Point Dry Needling -  04/28/17 0800    Consent Given? Yes   Education Handout Provided Yes   Muscles Treated Upper Body Upper trapezius  Lt   Upper Trapezius Response Twitch reponse elicited;Palpable increased muscle length              PT Education - 04/28/17 0844    Education provided Yes   Education Details Dry needling (see handout)   Person(s) Educated Patient   Methods Explanation;Handout   Comprehension Verbalized understanding             PT Long Term Goals - 04/01/17 0945      PT LONG TERM GOAL #1   Title Independent with HEP by 05/07/17   Status On-going     PT LONG TERM GOAL #2   Title Pt will routinely demonstrate neutral spine and shoulder posture w/o cues at least 75% of the time by 05/07/17   Status On-going     PT LONG TERM GOAL #3   Title Cervical ROM WFL w/o limitation due to pain or radicular symptoms by 05/07/17   Status  On-going     PT LONG TERM GOAL #4   Title B shoulder strength >/= 4+/5 w/o pain or increased radicular symptoms by 05/07/17   Status On-going     PT LONG TERM GOAL #5   Title Pt will report </= 25% sleep disturbance due to neck pain or radicular symptoms by 05/07/17   Status On-going     PT LONG TERM GOAL #6   Title Pt will report ability to complete ADLs, light household chores and typical job tasks w/o increased neck pain or radicular symptoms by 05/07/17   Status On-going               Plan - 04/28/17 0810    Clinical Impression Statement Pt c/o increased L sided neck pain today and demnostrating limited tolerance for exercises or manual STM, therefore discussed possibility of dry needling to address TPs and taut muscles. Pt gave verbal consent to treatment and L UT trap treated with DN with twitch elicited and decreased muscle tension noted following needling and STM. Treatment concluded with estim and moist heat to promote further relaxation.   Rehab Potential Good   Clinical Impairments Affecting Rehab Potential R Craniotomy for anuerysm clipping 06/2014, post-op blood clot on indefinite Xarelto; anxiety; depression; h/o seizures & CVA   PT Treatment/Interventions Patient/family education;Neuromuscular re-education;Therapeutic exercise;Therapeutic activities;Functional mobility training;Manual techniques;Dry needling;Taping;Electrical Stimulation;Moist Heat;Cryotherapy;Traction;ADLs/Self Care Home Management   PT Next Visit Plan assess response to DN; f/u re: insurance coverage for TENS/IT unit; posture and body mechanics training; manual therapy to address muscular tightness & TP's; anterior stretching & scapular strengthening/stabilization for neutralization of posture; gentle cervical ROM/stretching; consider modalities +/- mechanical traction as indicated for pain & radicular symptoms   Consulted and Agree with Plan of Care Patient      Patient will benefit from skilled  therapeutic intervention in order to improve the following deficits and impairments:  Pain, Increased muscle spasms, Impaired sensation, Postural dysfunction, Improper body mechanics, Decreased range of motion, Decreased strength, Decreased activity tolerance, Impaired UE functional use  Visit Diagnosis: Radiculopathy, cervical region  Cervicalgia  Abnormal posture  Muscle weakness (generalized)     Problem List Patient Active Problem List   Diagnosis Date Noted  . Fatigue 08/06/2016  . Loose stools 03/02/2016  . Diffuse abdominal pain 03/02/2016  . Acute frontal sinusitis 11/13/2015  . Chronic daily headache 04/14/2015  . Aneurysm, cerebral, nonruptured 02/12/2015  .  Worsening headaches 02/12/2015  . Breast pain, right 12/19/2014  . Polyarthralgia 12/19/2014  . Chest pain 10/01/2014  . Nausea without vomiting 10/01/2014  . GERD (gastroesophageal reflux disease) 07/30/2014  . DVT (deep venous thrombosis) ([place]) 07/23/2014  . Cerebral aneurysm without rupture 07/06/2014  . Depression with anxiety 03/23/2014    Percival Spanish, PT, MPT 04/28/2017, 9:42 AM  [facility] [address]  [place], [place], [address] Phone: [phone]   Fax:  [phone]  Name: Melissa Morgan MRN: [id number] Date of Birth: Feb 26, 1966

## 2017-04-28 NOTE — ED Notes (Signed)
ED Provider at bedside. 

## 2017-04-28 NOTE — ED Notes (Signed)
amb to CT w/o difficulty

## 2017-04-28 NOTE — ED Provider Notes (Signed)
[facility] Provider Note: Georgena Spurling, MD, FACEP  CSN: [id number] MRN: [id number] ARRIVAL: 04/28/17 at 2127 ROOM: [place]  Headache   HISTORY OF PRESENT ILLNESS  Melissa Morgan is a 51 y.o. female with a history of right MCA aneurysm repair who was on Xarelto for DVT history. She is here with a 2 day history of a severe, sharp stabbing pain to the left side of her head. The onset is sudden and these episodes last for several seconds. She has had 5 of them. Between these episodes she has pressure in her head which she rates as a 5 out of 10. There is associated nausea but no vomiting. She has also had dizziness which she describes as "not spinning but foggy headed". She feels fatigued but denies chest pain or shortness of breath. She has neck pain but this is chronic and she had "dry needling" done by a physical therapist earlier today. There is no focal neurologic deficit or photophobia associated with her headache.   Past Medical History:  Diagnosis Date  . Anxiety   . Arthritis   . Brain aneurysm   . Depression   . DVT (deep venous thrombosis) ([place])   . Endometriosis   . GERD (gastroesophageal reflux disease)    occ  . Headache   . Lacunar infarction ([place])   . PONV (postoperative nausea and vomiting)    extremely sick  . Seizures ([place])    tx 5 yrs in middle school  continuous  . Stroke ([place])    cva  14  . Tachycardia     Past Surgical History:  Procedure Laterality Date  . CRANIOTOMY Right 07/06/2014   Procedure: CRANIOTOMY INTRACRANIAL ANEURYSM FOR CAROTID;  Surgeon: Ashok Pall, MD;  Location: [facility];  Service: Neurosurgery;  Laterality: Right;  right   . lasar laparoscopy     for endometriosis x3    Family History  Problem Relation Age of Onset  . Hypertension Mother   . Heart attack Father   . Hypertension Brother        2 out of 3 brothers  . Heart attack Brother        2 out of 3 brothers  . Diabetes Brother   .  Asthma Sister   . Breast cancer Sister     Social History  Substance Use Topics  . Smoking status: Former Smoker    Packs/day: 0.00    Years: 30.00    Start date: 12/28/1983    Quit date: 05/26/2014  . Smokeless tobacco: Never Used     Comment: quit smoking 3 months ago  . Alcohol use Yes     Comment: socially    Prior to Admission medications   Medication Sig Start Date End Date Taking? Authorizing Provider  ALPRAZolam [name]) 0.5 MG tablet TAKE 1 TABLET BY MOUTH TWICE DAILY AS NEEDED FOR ANXIETY 03/24/17   Midge Minium, MD  Cholecalciferol 2000 units CAPS Take 1 capsule (2,000 Units total) by mouth daily. 08/07/16   Midge Minium, MD  co-enzyme Q-10 30 MG capsule Take 30 mg by mouth 3 (three) times daily.    [provider]  cyclobenzaprine (FLEXERIL) 5 MG tablet Take 1 tablet (5 mg total) by mouth at bedtime. 02/10/17   Brunetta Jeans, PA-C  dicyclomine (BENTYL) 20 MG tablet TAKE 1 TABLET(20 MG) BY MOUTH THREE TIMES DAILY AS NEEDED FOR SPASMS 10/05/16   Midge Minium, MD  DULoxetine (  CYMBALTA) 60 MG capsule TAKE 1 CAPSULE(60 MG) BY MOUTH DAILY 03/09/17   Midge Minium, MD  magnesium oxide (MAG-OX) 400 MG tablet Take 400 mg by mouth daily.    [provider]  metoprolol succinate (TOPROL-XL) 25 MG 24 hr tablet TAKE 1 TABLET(25 MG) BY MOUTH DAILY 04/12/17   Midge Minium, MD  pregabalin (LYRICA) 50 MG capsule Take 1 capsule (50 mg total) by mouth 2 (two) times daily. 02/25/17   Brunetta Jeans, PA-C  riboflavin (VITAMIN B-2) 100 MG TABS tablet Take 100 mg by mouth daily.    [provider]  Rivaroxaban (XARELTO) 15 MG TABS tablet Take 1/2 pill a day. 11/26/15   Volanda Napoleon, MD  XARELTO 15 MG TABS tablet TAKE 1 TABLET(15 MG) BY MOUTH DAILY WITH DINNER 03/09/17   Volanda Napoleon, MD    Allergies Imitrex [sumatriptan]; Dilaudid [hydromorphone hcl]; Ace inhibitors; Ondansetron; Tramadol; Codeine; and Latex   REVIEW OF SYSTEMS    Negative except as noted here or in the History of Present Illness.   PHYSICAL EXAMINATION  Initial Vital Signs Blood pressure 128/82, pulse 64, temperature 98.3 F (36.8 C), temperature source Oral, resp. rate 16, height 5\' 8"  (1.727 m), weight 88.5 kg (195 lb), last menstrual period 12/28/2012, SpO2 99 %.  Examination General: Well-developed, well-nourished female in no acute distress; appearance consistent with age of record HENT: normocephalic; atraumatic Eyes: pupils equal, round and reactive to light; extraocular muscles intact Neck: supple; bilateral posterior soft tissue tenderness Heart: regular rate and rhythm Lungs: clear to auscultation bilaterally Abdomen: soft; nondistended; nontender; bowel sounds present Extremities: No deformity; full range of motion; pulses normal Neurologic: Awake, alert and oriented; motor function intact in all extremities and symmetric; no facial droop; normal coordination speech; normal finger to nose; negative Romberg Skin: Warm and dry Psychiatric: Normal mood and affect   RESULTS  Summary of this visit's results, reviewed by myself:   EKG Interpretation  Date/Time:    Ventricular Rate:    PR Interval:    QRS Duration:   QT Interval:    QTC Calculation:   R Axis:     Text Interpretation:        Laboratory Studies: Results for orders placed or performed during the hospital encounter of 04/28/17 (from the past 24 hour(s))  CBC with Differential/Platelet     Status: None   Collection Time: 04/28/17  9:30 PM  Result Value Ref Range   WBC 8.1 4.0 - 10.5 K/uL   RBC 4.73 3.87 - 5.11 MIL/uL   Hemoglobin 14.8 12.0 - 15.0 g/dL   HCT 43.4 36.0 - 46.0 %   MCV 91.8 78.0 - 100.0 fL   MCH 31.3 26.0 - 34.0 pg   MCHC 34.1 30.0 - 36.0 g/dL   RDW 13.7 11.5 - 15.5 %   Platelets 242 150 - 400 K/uL   Neutrophils Relative % 43 %   Neutro Abs 3.5 1.7 - 7.7 K/uL   Lymphocytes Relative 46 %   Lymphs Abs 3.7 0.7 - 4.0 K/uL   Monocytes Relative  7 %   Monocytes Absolute 0.6 0.1 - 1.0 K/uL   Eosinophils Relative 3 %   Eosinophils Absolute 0.2 0.0 - 0.7 K/uL   Basophils Relative 1 %   Basophils Absolute 0.1 0.0 - 0.1 K/uL  Basic metabolic panel     Status: Abnormal   Collection Time: 04/28/17  9:30 PM  Result Value Ref Range   Sodium 139 135 - 145  mmol/L   Potassium 3.5 3.5 - 5.1 mmol/L   Chloride 103 101 - 111 mmol/L   CO2 26 22 - 32 mmol/L   Glucose, Bld 176 (H) 65 - 99 mg/dL   BUN 13 6 - 20 mg/dL   Creatinine, Ser 0.77 0.44 - 1.00 mg/dL   Calcium 9.3 8.9 - 10.3 mg/dL   GFR calc non Af Amer >60 >60 mL/min   GFR calc Af Amer >60 >60 mL/min   Anion gap 10 5 - 15  Urinalysis, Routine w reflex microscopic     Status: Abnormal   Collection Time: 04/28/17 11:22 PM  Result Value Ref Range   Color, Urine YELLOW YELLOW   APPearance CLEAR CLEAR   Specific Gravity, Urine 1.023 1.005 - 1.030   pH 5.5 5.0 - 8.0   Glucose, UA NEGATIVE NEGATIVE mg/dL   Hgb urine dipstick NEGATIVE NEGATIVE   Bilirubin Urine NEGATIVE NEGATIVE   Ketones, ur NEGATIVE NEGATIVE mg/dL   Protein, ur NEGATIVE NEGATIVE mg/dL   Nitrite NEGATIVE NEGATIVE   Leukocytes, UA MODERATE (A) NEGATIVE  Urinalysis, Microscopic (reflex)     Status: Abnormal   Collection Time: 04/28/17 11:22 PM  Result Value Ref Range   RBC / HPF 0-5 0 - 5 RBC/hpf   WBC, UA 6-30 0 - 5 WBC/hpf   Bacteria, UA MANY (A) NONE SEEN   Squamous Epithelial / LPF 0-5 (A) NONE SEEN   Imaging Studies: Ct Head Wo Contrast  Result Date: 04/28/2017 CLINICAL DATA:  Dizziness beginning this afternoon, nausea, feeling unwell. Headache today. History of stroke, seizures, brain aneurysm. EXAM: CT HEAD WITHOUT CONTRAST TECHNIQUE: Contiguous axial images were obtained from the base of the skull through the vertex without intravenous contrast. COMPARISON:  CT HEAD January 02, 2017 FINDINGS: BRAIN: No intraparenchymal hemorrhage, mass effect nor midline shift. The ventricles and sulci are normal. No acute  large vascular territory infarcts. Small area RIGHT frontal encephalomalacia. No abnormal extra-axial fluid collections. Basal cisterns are patent. VASCULAR: Status post clipping of RIGHT MCA aneurysm. SKULL/SOFT TISSUES: No skull fracture. Old RIGHT frontal craniotomy. LEFT parietal vascular [place]. No significant soft tissue swelling. ORBITS/SINUSES: The included ocular globes and orbital contents are normal.The mastoid aircells and included paranasal sinuses are well-aerated. OTHER: None. IMPRESSION: No acute intracranial process. Stable examination: Status post clipping RIGHT MCA aneurysm, old small RIGHT frontal lobe infarct. Electronically Signed   By: Elon Alas M.D.   On: 04/28/2017 22:06    ED COURSE  Nursing notes and initial vitals signs, including pulse oximetry, reviewed.  Vitals:   04/28/17 2142 04/28/17 2143 04/28/17 2231  BP:  (!) 137/93 128/82  Pulse:  88 64  Resp:  20 16  Temp:  98.3 F (36.8 C)   TempSrc:  Oral   SpO2:  98% 99%  Weight: 88.5 kg (195 lb)    Height: 5\' 8"  (1.727 m)     12:06 AM Headache and nausea significantly improved after IV medications. Patient denies dysuria or other urinary symptoms. Will send urine for culture.  PROCEDURES    ED DIAGNOSES     ICD-10-CM   1. Bad headache R51        Gulianna Hornsby, MD 04/29/17 1324

## 2017-04-28 NOTE — ED Notes (Signed)
Patient transported to CT 

## 2017-04-29 ENCOUNTER — Ambulatory Visit (INDEPENDENT_AMBULATORY_CARE_PROVIDER_SITE_OTHER): Payer: 59 | Admitting: Psychology

## 2017-04-29 DIAGNOSIS — F331 Major depressive disorder, recurrent, moderate: Secondary | ICD-10-CM

## 2017-04-30 LAB — URINE CULTURE

## 2017-05-05 ENCOUNTER — Ambulatory Visit: Payer: 59 | Admitting: Physical Therapy

## 2017-05-05 DIAGNOSIS — M5412 Radiculopathy, cervical region: Secondary | ICD-10-CM | POA: Diagnosis not present

## 2017-05-05 DIAGNOSIS — M6281 Muscle weakness (generalized): Secondary | ICD-10-CM | POA: Diagnosis not present

## 2017-05-05 DIAGNOSIS — M542 Cervicalgia: Secondary | ICD-10-CM

## 2017-05-05 DIAGNOSIS — R293 Abnormal posture: Secondary | ICD-10-CM

## 2017-05-05 NOTE — Therapy (Signed)
[facility] [address]  [place], [place], [address] Phone: [phone]   Fax:  [phone]  Physical Therapy Treatment  Patient Details  Name: Melissa Morgan MRN: [id number] Date of Birth: [birth year] Referring Provider: Marylyn Ishihara L. Christella Noa, MD  Encounter Date: 05/05/2017      PT End of Session - 05/05/17 1702    Visit Number 5   Number of Visits 12   Date for PT Re-Evaluation 05/07/17   Authorization Type [facility]    PT Start Time 1702   PT Stop Time 1759   PT Time Calculation (min) 57 min   Activity Tolerance Patient tolerated treatment well   Behavior During Therapy WFL for tasks assessed/performed      Past Medical History:  Diagnosis Date  . Anxiety   . Arthritis   . Brain aneurysm   . Depression   . DVT (deep venous thrombosis) ([place])   . Endometriosis   . GERD (gastroesophageal reflux disease)    occ  . Headache   . Lacunar infarction ([place])   . PONV (postoperative nausea and vomiting)    extremely sick  . Seizures ([place])    tx 5 yrs in middle school  continuous  . Stroke ([place])    cva  14  . Tachycardia     Past Surgical History:  Procedure Laterality Date  . CRANIOTOMY Right 07/06/2014   Procedure: CRANIOTOMY INTRACRANIAL ANEURYSM FOR CAROTID;  Surgeon: Ashok Pall, MD;  Location: [facility] [place];  Service: Neurosurgery;  Laterality: Right;  right   . lasar laparoscopy     for endometriosis x3    There were no vitals filed for this visit.      Subjective Assessment - 05/05/17 1708    Subjective Pt reporting "miserable" last 2 days. Slept with heating pad on last night. Also reportin she ended up in the ER last week for her "usual migraine cocktail". Does not feel like it was due to the therapy, but afriad to try DN again.   Pertinent History R Craniotomy for anuerysm clipping 06/2014, post-op blood clot on indefinite Xarelto   Patient Stated Goals "to be able to work w/ less pain and avoid  surgery"   Currently in Pain? Yes   Pain Score 7    Pain Location Neck   Pain Orientation Lower;Left   Pain Descriptors / Indicators Burning   Pain Radiating Towards burning from neck to mid thoracic spine btw shoulder blades                         [facility] Adult PT Treatment/Exercise - 05/05/17 1702      Neck Exercises: Theraband   Shoulder Extension 15 reps;Red   Shoulder Extension Limitations standing   Rows 15 reps;Red   Rows Limitations standing   Shoulder External Rotation 15 reps;Red   Shoulder External Rotation Limitations hooklying on pool noodle   Horizontal ABduction 15 reps;Red   Horizontal ABduction Limitations hooklying on pool noodle   Other Theraband Exercises shoulder flexion + opp shoulder extension with red TB x15     Neck Exercises: Seated   Neck Retraction 15 reps;5 secs   Other Seated Exercise B scapular retraction 15x5"     Hand Exercises for Cervical Radiculopathy   Other Hand Exercise for Cervical Radiculopathy Supine R median nerve glide x10   Other Hand Exercise for Cervical Radiculopathy Seated brachial plexus nerve glide x10     Modalities  Modalities Electrical Stimulation;Moist Heat     Moist Heat Therapy   Number Minutes Moist Heat 15 Minutes   Moist Heat Location Cervical;Shoulder     Electrical Stimulation   Electrical Stimulation Location cervical & B UT/scapular region   Electrical Stimulation Action IFC   Electrical Stimulation Parameters to pt tolerance x15'   Electrical Stimulation Goals Pain;Tone     Neck Exercises: Stretches   Upper Trapezius Stretch 30 seconds;2 reps   Upper Trapezius Stretch Limitations seated with hand on edge of seat to anchor shoulder, no overpressure   Levator Stretch 30 seconds;2 reps   Levator Stretch Limitations seated with hand behind back to maintain neutral shoulder, no overpressure   Other Neck Stretches Rhomboid stretch with clasped hands 2x30"                      PT Long Term Goals - 04/01/17 0945      PT LONG TERM GOAL #1   Title Independent with HEP by 05/07/17   Status On-going     PT LONG TERM GOAL #2   Title Pt will routinely demonstrate neutral spine and shoulder posture w/o cues at least 75% of the time by 05/07/17   Status On-going     PT LONG TERM GOAL #3   Title Cervical ROM WFL w/o limitation due to pain or radicular symptoms by 05/07/17   Status On-going     PT LONG TERM GOAL #4   Title B shoulder strength >/= 4+/5 w/o pain or increased radicular symptoms by 05/07/17   Status On-going     PT LONG TERM GOAL #5   Title Pt will report </= 25% sleep disturbance due to neck pain or radicular symptoms by 05/07/17   Status On-going     PT LONG TERM GOAL #6   Title Pt will report ability to complete ADLs, light household chores and typical job tasks w/o increased neck pain or radicular symptoms by 05/07/17   Status On-going               Plan - 05/05/17 1711    Clinical Impression Statement Pt continuing to report high levels of pain along with intermittent radicular symptoms. Noting some relief when she attempts nerve glides. Progressed scapular strengthening and stabilization with theraband resisted exercises - pt reporting pain in L hand initially with horiz abduction, but resolved after median nerve glide and pt able to complete all remaining exercises w/o increased pain.   Rehab Potential Good   Clinical Impairments Affecting Rehab Potential R Craniotomy for anuerysm clipping 06/2014, post-op blood clot on indefinite Xarelto; anxiety; depression; h/o seizures & CVA   PT Treatment/Interventions Patient/family education;Neuromuscular re-education;Therapeutic exercise;Therapeutic activities;Functional mobility training;Manual techniques;Dry needling;Taping;Electrical Stimulation;Moist Heat;Cryotherapy;Traction;ADLs/Self Care Home Management   PT Next Visit Plan f/u re: insurance coverage for TENS/IT unit; posture and body mechanics  training; manual therapy to address muscular tightness & TP's; anterior stretching & scapular strengthening/stabilization for neutralization of posture; gentle cervical ROM/stretching; consider modalities +/- mechanical traction as indicated for pain & radicular symptoms   Consulted and Agree with Plan of Care Patient      Patient will benefit from skilled therapeutic intervention in order to improve the following deficits and impairments:  Pain, Increased muscle spasms, Impaired sensation, Postural dysfunction, Improper body mechanics, Decreased range of motion, Decreased strength, Decreased activity tolerance, Impaired UE functional use  Visit Diagnosis: Radiculopathy, cervical region  Cervicalgia  Abnormal posture  Muscle weakness (generalized)     Problem  List Patient Active Problem List   Diagnosis Date Noted  . Fatigue 08/06/2016  . Loose stools 03/02/2016  . Diffuse abdominal pain 03/02/2016  . Acute frontal sinusitis 11/13/2015  . Chronic daily headache 04/14/2015  . Aneurysm, cerebral, nonruptured 02/12/2015  . Worsening headaches 02/12/2015  . Breast pain, right 12/19/2014  . Polyarthralgia 12/19/2014  . Chest pain 10/01/2014  . Nausea without vomiting 10/01/2014  . GERD (gastroesophageal reflux disease) 07/30/2014  . DVT (deep venous thrombosis) ([place]) 07/23/2014  . Cerebral aneurysm without rupture 07/06/2014  . Depression with anxiety 03/23/2014    Percival Spanish, PT, MPT 05/05/2017, 6:01 PM  [facility] [address]  [place], [place], [address] Phone: [phone]   Fax:  [phone]  Name: Melissa Morgan MRN: [id number] Date of Birth: [birth year]

## 2017-05-06 ENCOUNTER — Ambulatory Visit: Payer: 59

## 2017-05-06 DIAGNOSIS — M5412 Radiculopathy, cervical region: Secondary | ICD-10-CM

## 2017-05-06 DIAGNOSIS — M6281 Muscle weakness (generalized): Secondary | ICD-10-CM | POA: Diagnosis not present

## 2017-05-06 DIAGNOSIS — M542 Cervicalgia: Secondary | ICD-10-CM

## 2017-05-06 DIAGNOSIS — R293 Abnormal posture: Secondary | ICD-10-CM | POA: Diagnosis not present

## 2017-05-06 NOTE — Therapy (Addendum)
[facility] [address]  [place], [place], [address] Phone: [phone]   Fax:  [phone]  Physical Therapy Treatment  Patient Details  Name: Melissa Morgan MRN: [id number] Date of Birth: [birth year] Referring Provider: Marylyn Ishihara L. Christella Noa, MD  Encounter Date: 05/06/2017      PT End of Session - 05/06/17 1710    Visit Number 6   Number of Visits 12   Date for PT Re-Evaluation 06/18/17   Authorization Type [facility]    PT Start Time 1702   PT Stop Time 1752   PT Time Calculation (min) 50 min   Activity Tolerance Patient tolerated treatment well   Behavior During Therapy WFL for tasks assessed/performed      Past Medical History:  Diagnosis Date  . Anxiety   . Arthritis   . Brain aneurysm   . Depression   . DVT (deep venous thrombosis) ([place])   . Endometriosis   . GERD (gastroesophageal reflux disease)    occ  . Headache   . Lacunar infarction ([place])   . PONV (postoperative nausea and vomiting)    extremely sick  . Seizures ([place])    tx 5 yrs in middle school  continuous  . Stroke ([place])    cva  14  . Tachycardia     Past Surgical History:  Procedure Laterality Date  . CRANIOTOMY Right 07/06/2014   Procedure: CRANIOTOMY INTRACRANIAL ANEURYSM FOR CAROTID;  Surgeon: Ashok Pall, MD;  Location: [facility];  Service: Neurosurgery;  Laterality: Right;  right   . lasar laparoscopy     for endometriosis x3    There were no vitals filed for this visit.      Subjective Assessment - 05/06/17 1708    Subjective Pt. noting her pain has been less since yesterday's therapy.     Patient Stated Goals "to be able to work w/ less pain and avoid surgery"   Currently in Pain? Yes   Pain Score 5    Pain Location Neck   Pain Orientation Lower;Right;Left   Pain Descriptors / Indicators Burning            [facility] PT Assessment - 05/06/17 1720      AROM   AROM Assessment Site Cervical;Shoulder   Right/Left  Shoulder Right;Left   Right Shoulder Flexion 132 Degrees   Right Shoulder ABduction 138 Degrees   Left Shoulder Flexion 139 Degrees   Left Shoulder ABduction 141 Degrees   Cervical Flexion 51   Cervical Extension 40   Cervical - Right Side Bend 21   Cervical - Left Side Bend 31   Cervical - Right Rotation 55   Cervical - Left Rotation 64     Strength   Right/Left Shoulder Right;Left   Right Shoulder Flexion 4/5   Right Shoulder ABduction 4/5   Right Shoulder Internal Rotation 4/5   Right Shoulder External Rotation 4/5   Left Shoulder Flexion 4/5   Left Shoulder ABduction 4/5   Left Shoulder Internal Rotation 4/5   Left Shoulder External Rotation 4/5   Right/Left hand Right;Left   Right Hand Grip (lbs) 24, 22   Left Hand Grip (lbs) 21, 25                     [facility] Adult PT Treatment/Exercise - 05/06/17 1742      Self-Care   Self-Care Other Self-Care Comments   Other Self-Care Comments  Discussion with pt. regarding goal status  and progress with PT thus far; PT encouraged pt. to contact MD for f/u regarding status     Neck Exercises: Machines for Strengthening   UBE (Upper Arm Bike) lvl 2.5 fwd/back x 3' each     Neck Exercises: Seated   Neck Retraction 15 reps;5 secs  pain thus terminated    Other Seated Exercise B scapular retraction 15x5"     Shoulder Exercises: Standing   External Rotation Both;15 reps;Theraband   Theraband Level (Shoulder External Rotation) Level 1 (Yellow)   External Rotation Limitations on doorframe    Retraction Both;10 reps  5" hold    Retraction Limitations on doorframe      Hand Exercises for Cervical Radiculopathy   Other Hand Exercise for Cervical Radiculopathy Supine R median nerve glide x10   Other Hand Exercise for Cervical Radiculopathy Seated brachial plexus nerve glide x10     Neck Exercises: Stretches   Upper Trapezius Stretch 30 seconds;2 reps   Upper Trapezius Stretch Limitations seated with hand on edge of seat  to anchor shoulder, no overpressure   Levator Stretch 30 seconds;2 reps   Levator Stretch Limitations seated with hand behind back to maintain neutral shoulder, no overpressure   Other Neck Stretches Rhomboid stretch with clasped hands 2x30"                     PT Long Term Goals - 05/06/17 1725      PT LONG TERM GOAL #1   Title Independent with HEP by 06/18/17   Status On-going     PT LONG TERM GOAL #2   Title Pt will routinely demonstrate neutral spine and shoulder posture w/o cues at least 75% of the time by 06/18/17   Status On-going     PT LONG TERM GOAL #3   Title Cervical ROM WFL w/o limitation due to pain or radicular symptoms by 06/18/17   Status On-going     PT LONG TERM GOAL #4   Title B shoulder strength >/= 4+/5 w/o pain or increased radicular symptoms by 06/18/17   Status On-going     PT LONG TERM GOAL #5   Title Pt will report </= 25% sleep disturbance due to neck pain or radicular symptoms by 06/18/17   Status On-going     PT LONG TERM GOAL #6   Title Pt will report ability to complete ADLs, light household chores and typical job tasks w/o increased neck pain or radicular symptoms by 06/18/17   Status On-going               Plan - 05/06/17 1727    Clinical Impression Statement Pt. noting some lumbar pain, which is new for her.  Some improvement in neck pain following treatment yesterday.  Pt. cervical and shoulder AROM nearly unchanged today with testing.  Overall half grade strength improvement in R shoulder with testing.  Pt. noting she has not had improvement in sleep disturbances at this point still waking frequently with neck pain.  Still having frequent neck pain with household chores and ADL's.  Pt. only attending 6 visits of therapy to this point in POC likely contributing to lack of progress.  Some decrease in Bilateral grip strength with testing today.  Some continued radicular symptoms in R and L hand which pt. explained "depends on the day  which arm I feel tingling in".  PT discussing pt. goal status with her encouraging pt. to contact MD for further consultation regarding status.  Pt. feels  she would like to continue with therapy.      PT Treatment/Interventions Patient/family education;Neuromuscular re-education;Therapeutic exercise;Therapeutic activities;Functional mobility training;Manual techniques;Dry needling;Taping;Electrical Stimulation;Moist Heat;Cryotherapy;Traction;ADLs/Self Care Home Management   PT Next Visit Plan f/u re: insurance coverage for TENS/IT unit; posture and body mechanics training; manual therapy to address muscular tightness & TP's; anterior stretching & scapular strengthening/stabilization for neutralization of posture; gentle cervical ROM/stretching; consider modalities +/- mechanical traction as indicated for pain & radicular symptoms      Patient will benefit from skilled therapeutic intervention in order to improve the following deficits and impairments:  Pain, Increased muscle spasms, Impaired sensation, Postural dysfunction, Improper body mechanics, Decreased range of motion, Decreased strength, Decreased activity tolerance, Impaired UE functional use  Visit Diagnosis: Radiculopathy, cervical region  Cervicalgia  Abnormal posture  Muscle weakness (generalized)     Problem List Patient Active Problem List   Diagnosis Date Noted  . Fatigue 08/06/2016  . Loose stools 03/02/2016  . Diffuse abdominal pain 03/02/2016  . Acute frontal sinusitis 11/13/2015  . Chronic daily headache 04/14/2015  . Aneurysm, cerebral, nonruptured 02/12/2015  . Worsening headaches 02/12/2015  . Breast pain, right 12/19/2014  . Polyarthralgia 12/19/2014  . Chest pain 10/01/2014  . Nausea without vomiting 10/01/2014  . GERD (gastroesophageal reflux disease) 07/30/2014  . DVT (deep venous thrombosis) ([place]) 07/23/2014  . Cerebral aneurysm without rupture 07/06/2014  . Depression with anxiety 03/23/2014     Bess Harvest, PTA 05/06/17 6:42 PM  [facility] [address]  [place], [place], [address] Phone: [phone]   Fax:  [phone]  Name: Melissa Morgan MRN: [id number] Date of Birth: 10-15-1966  Consulted with pt and PTA during visit today and reviewed current status and progress toward goals. Pt's initial attendance with PT sessions was very erratic but now coming more consistently, although pt has only typically been able to attend PT 1x/wk due to work schedule. Given this will extend POC dates for additional 6 weeks to allow pt to complete remaining approved visits in current POC, but encouraged pt to f/u with MD due to persistent high pain levels and frequent radicular symptoms.  Percival Spanish, PT, MPT 05/06/17, 6:42 PM  [facility] [address]  [place], [place], [address] Phone: [phone]   Fax:  [phone]   PHYSICAL THERAPY DISCHARGE SUMMARY  Visits from Start of Care: 6  Current functional level related to goals / functional outcomes:   Refer to above clinical impression and above note for status as of last visit. POC was extended for 6 wks but pt has not returned in 30 days, therefore will proceed with discharge from PT for this episode.   Remaining deficits:   As above.   Education / Equipment:   HEP  Plan: Patient agrees to discharge.  Patient goals were not met. Patient is being discharged due to not returning since the last visit.  ?????     Percival Spanish, PT, MPT 06/04/17, 8:21 AM  [facility] [address]  [place], [place], [address] Phone: [phone]   Fax:  [phone]

## 2017-05-13 ENCOUNTER — Ambulatory Visit: Payer: Self-pay | Admitting: Psychology

## 2017-05-16 ENCOUNTER — Encounter: Payer: Self-pay | Admitting: Family Medicine

## 2017-05-17 ENCOUNTER — Other Ambulatory Visit: Payer: Self-pay | Admitting: Family Medicine

## 2017-05-17 MED ORDER — ALPRAZOLAM 0.5 MG PO TABS: 0.5000 mg | ORAL_TABLET | Freq: Two times a day (BID) | ORAL | 0 refills | Status: DC | PRN

## 2017-05-17 NOTE — Telephone Encounter (Signed)
Last OV 02/25/17 ([facility]) Alprazolam last filled 03/24/17 #60 with 0

## 2017-05-26 ENCOUNTER — Ambulatory Visit (INDEPENDENT_AMBULATORY_CARE_PROVIDER_SITE_OTHER): Payer: 59 | Admitting: Family Medicine

## 2017-05-26 ENCOUNTER — Encounter: Payer: Self-pay | Admitting: Family Medicine

## 2017-05-26 VITALS — BP 110/80 | HR 83 | Temp 98.6°F | Resp 16 | Ht 68.0 in | Wt 194.5 lb

## 2017-05-26 DIAGNOSIS — R82998 Other abnormal findings in urine: Secondary | ICD-10-CM

## 2017-05-26 DIAGNOSIS — R110 Nausea: Secondary | ICD-10-CM

## 2017-05-26 DIAGNOSIS — R8299 Other abnormal findings in urine: Secondary | ICD-10-CM

## 2017-05-26 DIAGNOSIS — Z1211 Encounter for screening for malignant neoplasm of colon: Secondary | ICD-10-CM

## 2017-05-26 LAB — POCT URINALYSIS DIPSTICK
Bilirubin, UA: NEGATIVE
Blood, UA: NEGATIVE
Glucose, UA: NEGATIVE
Nitrite, UA: NEGATIVE
Urobilinogen, UA: 0.2 E.U./dL
pH, UA: 5.5 (ref 5.0–8.0)

## 2017-05-26 NOTE — Progress Notes (Signed)
   Subjective:    Patient ID: Melissa Morgan, female    DOB: [birth year], 51 y.o.   MRN: [id number]  HPI Colon cancer screen- pt is fearful about stopping her xarelto for a colonoscopy.  Wants to do cologuard.  Nausea- pt was seen in ER 6/20 and had multiple bacteria present in urine and was told to follow up.  Denies dysuria, frequency.     Review of Systems For ROS see HPI     Objective:   Physical Exam  Constitutional: She is oriented to person, place, and time. She appears well-developed and well-nourished. No distress.  HENT:  Head: Normocephalic and atraumatic.  Eyes: Pupils are equal, round, and reactive to light. Conjunctivae and EOM are normal.  Neck: Normal range of motion. Neck supple. No thyromegaly present.  Cardiovascular: Normal rate, regular rhythm, normal heart sounds and intact distal pulses.   No murmur heard. Pulmonary/Chest: Effort normal and breath sounds normal. No respiratory distress.  Abdominal: Soft. She exhibits no distension. There is no tenderness.  Musculoskeletal: She exhibits no edema.  Lymphadenopathy:    She has no cervical adenopathy.  Neurological: She is alert and oriented to person, place, and time.  Skin: Skin is warm and dry.  Psychiatric: She has a normal mood and affect. Her behavior is normal.  Vitals reviewed.         Assessment & Plan:  Nausea- this has mostly resolved and only occurs intermittently.  Due to bacteria present in urine in ER, will repeat urine culture today.  Reviewed supportive care and red flags that should prompt return.  Pt expressed understanding and is in agreement w/ plan.   Colon cancer screen- pt is not willing to stop her Xarelto for a colonoscopy and is only willing to do cologuard.  Explained that if this test is +, she will need to proceed w/ colonoscopy.  Pt is aware and agreeable.

## 2017-05-26 NOTE — Patient Instructions (Signed)
Schedule your complete physical in 3-4 months Please complete the Cologuard as directed We'll notify you of your urine results- make sure you are drinking plenty of water! Call with any questions or concerns Enjoy your summer!!!

## 2017-05-26 NOTE — Progress Notes (Signed)
Pre visit review using our clinic review tool, if applicable. No additional management support is needed unless otherwise documented below in the visit note. 

## 2017-05-27 LAB — URINE CULTURE

## 2017-06-02 ENCOUNTER — Ambulatory Visit: Payer: 59 | Admitting: Psychology

## 2017-06-03 ENCOUNTER — Ambulatory Visit (INDEPENDENT_AMBULATORY_CARE_PROVIDER_SITE_OTHER): Payer: 59 | Admitting: Psychology

## 2017-06-03 DIAGNOSIS — F331 Major depressive disorder, recurrent, moderate: Secondary | ICD-10-CM

## 2017-06-10 ENCOUNTER — Encounter: Payer: Self-pay | Admitting: Family Medicine

## 2017-06-14 NOTE — Telephone Encounter (Signed)
Forms were received this morning and completed. They were faxed in early this morning and I received fax confirmation of this. Please message/call patient to let her know all has been taken care of.

## 2017-06-21 MED FILL — DICYCLOMINE 20 MG TABLET: 20 | 20 days supply | Qty: 60 | Fill #0

## 2017-06-21 MED FILL — DULoxetine HCL 60 MG CPEP: 60 | 30 days supply | Qty: 30 | Fill #0

## 2017-06-21 MED FILL — METOPROLOL SUCC ER 25 MG TA: 25 | 30 days supply | Qty: 30 | Fill #0

## 2017-06-23 ENCOUNTER — Ambulatory Visit (INDEPENDENT_AMBULATORY_CARE_PROVIDER_SITE_OTHER): Payer: 59 | Admitting: Psychology

## 2017-06-23 DIAGNOSIS — F331 Major depressive disorder, recurrent, moderate: Secondary | ICD-10-CM

## 2017-06-23 DIAGNOSIS — M791 Myalgia: Secondary | ICD-10-CM | POA: Diagnosis not present

## 2017-07-06 ENCOUNTER — Ambulatory Visit (INDEPENDENT_AMBULATORY_CARE_PROVIDER_SITE_OTHER): Payer: 59 | Admitting: Psychology

## 2017-07-06 DIAGNOSIS — F331 Major depressive disorder, recurrent, moderate: Secondary | ICD-10-CM | POA: Diagnosis not present

## 2017-07-14 ENCOUNTER — Other Ambulatory Visit: Payer: Self-pay | Admitting: Family Medicine

## 2017-07-14 MED ORDER — ALPRAZOLAM 0.5 MG PO TABS: 0.5000 mg | ORAL_TABLET | Freq: Two times a day (BID) | ORAL | 0 refills | Status: DC | PRN

## 2017-07-14 NOTE — Telephone Encounter (Signed)
Medication filled to pharmacy as requested.   

## 2017-07-14 NOTE — Telephone Encounter (Signed)
Last OV 05/26/17 Alprazolam last filled 05/17/17 #60 with 0

## 2017-07-20 ENCOUNTER — Ambulatory Visit (INDEPENDENT_AMBULATORY_CARE_PROVIDER_SITE_OTHER): Payer: 59 | Admitting: Psychology

## 2017-07-20 DIAGNOSIS — F331 Major depressive disorder, recurrent, moderate: Secondary | ICD-10-CM

## 2017-07-20 MED FILL — DICYCLOMINE 20 MG TABLET: 20 | 20 days supply | Qty: 60 | Fill #1

## 2017-07-20 MED FILL — METOPROLOL SUCC ER 25 MG TA: 25 | 30 days supply | Qty: 30 | Fill #1

## 2017-07-24 DIAGNOSIS — M791 Myalgia: Secondary | ICD-10-CM | POA: Diagnosis not present

## 2017-08-03 ENCOUNTER — Encounter: Payer: Self-pay | Admitting: General Practice

## 2017-08-03 ENCOUNTER — Encounter: Payer: Self-pay | Admitting: Family Medicine

## 2017-08-03 DIAGNOSIS — R928 Other abnormal and inconclusive findings on diagnostic imaging of breast: Secondary | ICD-10-CM

## 2017-08-06 ENCOUNTER — Ambulatory Visit: Payer: 59 | Admitting: Psychology

## 2017-08-06 ENCOUNTER — Other Ambulatory Visit: Payer: Self-pay | Admitting: Family Medicine

## 2017-08-10 ENCOUNTER — Ambulatory Visit: Payer: Self-pay | Admitting: Psychology

## 2017-08-12 ENCOUNTER — Other Ambulatory Visit: Payer: Self-pay | Admitting: Family Medicine

## 2017-08-12 DIAGNOSIS — N644 Mastodynia: Secondary | ICD-10-CM

## 2017-08-12 DIAGNOSIS — R52 Pain, unspecified: Secondary | ICD-10-CM

## 2017-08-12 MED FILL — XARELTO 15 MG TABLET: 15 | 30 days supply | Qty: 30 | Fill #0

## 2017-08-12 MED FILL — DULoxetine HCL 60 MG CPEP: 60 | 30 days supply | Qty: 30 | Fill #1

## 2017-08-17 ENCOUNTER — Telehealth: Payer: Self-pay | Admitting: Family Medicine

## 2017-08-17 NOTE — Telephone Encounter (Signed)
[facility] had sent [facility] in EPIC for Tabori to sign. I reassigned them to myself and have signed them. Melissa Morgan should be good to go for her appointment.

## 2017-08-17 NOTE — Telephone Encounter (Signed)
[facility] calling asking for a signed mammo order in Epic as pt is scheduled for tomorrow.

## 2017-08-18 ENCOUNTER — Ambulatory Visit: Payer: Self-pay

## 2017-08-18 ENCOUNTER — Ambulatory Visit
Admission: RE | Admit: 2017-08-18 | Discharge: 2017-08-18 | Disposition: A | Discharge status: Home / Self Care | Payer: 59 | Source: Ambulatory Visit | Attending: Family Medicine | Admitting: Family Medicine

## 2017-08-18 DIAGNOSIS — R52 Pain, unspecified: Secondary | ICD-10-CM

## 2017-08-18 DIAGNOSIS — R928 Other abnormal and inconclusive findings on diagnostic imaging of breast: Secondary | ICD-10-CM | POA: Diagnosis not present

## 2017-08-18 DIAGNOSIS — N644 Mastodynia: Secondary | ICD-10-CM

## 2017-08-23 DIAGNOSIS — M791 Myalgia, unspecified site: Secondary | ICD-10-CM | POA: Diagnosis not present

## 2017-08-24 MED FILL — METOPROLOL SUCC ER 25 MG TA: 25 | 30 days supply | Qty: 30 | Fill #2

## 2017-09-06 DIAGNOSIS — Z1212 Encounter for screening for malignant neoplasm of rectum: Secondary | ICD-10-CM | POA: Diagnosis not present

## 2017-09-06 DIAGNOSIS — Z1211 Encounter for screening for malignant neoplasm of colon: Secondary | ICD-10-CM | POA: Diagnosis not present

## 2017-09-06 LAB — COLOGUARD: COLOGUARD: NEGATIVE

## 2017-09-15 ENCOUNTER — Encounter: Payer: 59 | Admitting: Family Medicine

## 2017-09-15 DIAGNOSIS — Z0289 Encounter for other administrative examinations: Secondary | ICD-10-CM

## 2017-09-16 ENCOUNTER — Other Ambulatory Visit: Payer: Self-pay | Admitting: General Practice

## 2017-09-16 ENCOUNTER — Encounter: Payer: Self-pay | Admitting: Family Medicine

## 2017-09-16 ENCOUNTER — Encounter: Payer: Self-pay | Admitting: General Practice

## 2017-09-16 MED ORDER — DULOXETINE HCL 60 MG PO CPEP: ORAL_CAPSULE | ORAL | 3 refills | Status: DC

## 2017-09-16 MED FILL — DULoxetine HCL 60 MG CPEP: 60 | 30 days supply | Qty: 30 | Fill #0

## 2017-09-21 ENCOUNTER — Encounter: Payer: Self-pay | Admitting: General Practice

## 2017-09-21 MED FILL — METOPROLOL SUCC ER 25 MG TA: 25 | 30 days supply | Qty: 30 | Fill #3

## 2017-09-23 DIAGNOSIS — M791 Myalgia, unspecified site: Secondary | ICD-10-CM | POA: Diagnosis not present

## 2017-09-26 ENCOUNTER — Other Ambulatory Visit: Payer: Self-pay | Admitting: Family Medicine

## 2017-09-27 MED ORDER — ALPRAZOLAM 0.5 MG PO TABS: 0.5000 mg | ORAL_TABLET | Freq: Two times a day (BID) | ORAL | 0 refills | Status: DC | PRN

## 2017-09-27 MED FILL — ALPRAZolam 0.5 MG TABS: 0.5 | 30 days supply | Qty: 60 | Fill #0

## 2017-09-27 NOTE — Telephone Encounter (Signed)
Last OV 05/26/17 ([facility]) Alprazolam last filled 07/14/17 #60 with 0

## 2017-09-27 NOTE — Telephone Encounter (Signed)
Medication filled to pharmacy as requested.   

## 2017-09-28 MED FILL — DICYCLOMINE 20 MG TABLET: 20 | 20 days supply | Qty: 60 | Fill #2

## 2017-10-12 ENCOUNTER — Encounter: Payer: Self-pay | Admitting: Family Medicine

## 2017-10-23 DIAGNOSIS — M791 Myalgia, unspecified site: Secondary | ICD-10-CM | POA: Diagnosis not present

## 2017-10-25 MED FILL — METOPROLOL SUCC ER 25 MG TA: 25 | 30 days supply | Qty: 30 | Fill #4

## 2017-10-25 MED FILL — DULoxetine HCL 60 MG CPEP: 60 | 30 days supply | Qty: 30 | Fill #1

## 2017-11-23 DIAGNOSIS — M791 Myalgia, unspecified site: Secondary | ICD-10-CM | POA: Diagnosis not present

## 2017-11-25 ENCOUNTER — Other Ambulatory Visit: Payer: Self-pay | Admitting: Family Medicine

## 2017-11-25 ENCOUNTER — Other Ambulatory Visit: Payer: Self-pay | Admitting: Hematology & Oncology

## 2017-11-25 MED FILL — XARELTO 15 MG TABLET: 15 | 30 days supply | Qty: 30 | Fill #0

## 2017-11-25 MED FILL — METOPROLOL SUCC ER 25 MG TA: 25 | 90 days supply | Qty: 90 | Fill #0

## 2017-11-25 MED FILL — DULoxetine HCL 60 MG CPEP: 60 | 30 days supply | Qty: 30 | Fill #2

## 2017-12-07 ENCOUNTER — Other Ambulatory Visit: Payer: Self-pay | Admitting: Family Medicine

## 2017-12-08 MED ORDER — ALPRAZOLAM 0.5 MG PO TABS: 0.5000 mg | ORAL_TABLET | Freq: Two times a day (BID) | ORAL | 0 refills | Status: DC | PRN

## 2017-12-08 MED FILL — ALPRAZolam 0.5 MG TABS: 0.5 | 30 days supply | Qty: 60 | Fill #0

## 2017-12-08 NOTE — Telephone Encounter (Signed)
Last OV 05/26/17 Alprazolam last filled 09/27/17 #60 with 0

## 2017-12-13 ENCOUNTER — Telehealth: Payer: 59 | Admitting: Family

## 2017-12-13 DIAGNOSIS — J111 Influenza due to unidentified influenza virus with other respiratory manifestations: Secondary | ICD-10-CM

## 2017-12-13 MED ORDER — PREDNISONE 5 MG PO TABS: 5.0000 mg | ORAL_TABLET | ORAL | 0 refills | Status: DC

## 2017-12-13 MED ORDER — OSELTAMIVIR PHOSPHATE 75 MG PO CAPS: 75.0000 mg | ORAL_CAPSULE | Freq: Two times a day (BID) | ORAL | 0 refills | Status: DC

## 2017-12-13 MED ORDER — BENZONATATE 100 MG PO CAPS: 100.0000 mg | ORAL_CAPSULE | Freq: Three times a day (TID) | ORAL | 0 refills | Status: DC | PRN

## 2017-12-13 MED FILL — predniSONE 5 MG TABS: 5 | 6 days supply | Qty: 21 | Fill #0

## 2017-12-13 MED FILL — OSELTAMIVIR PHOSPHATE 75 MG: 75 | 5 days supply | Qty: 10 | Fill #0

## 2017-12-13 MED FILL — BENZONATATE 100 MG CAPSULE: 100 | 5 days supply | Qty: 30 | Fill #0

## 2017-12-13 NOTE — Progress Notes (Signed)
Thank you for the details you included in the comment boxes. Those details are very helpful in determining the best course of treatment for you and help [place] to provide the best care. This may or may not be the flu, but if not, it is likely another respiratory virus. I will give Tamiflu as you have a very high exposure and a prednisone pack and tessalon perles to help with your cough as well.   E visit for Flu like symptoms   We are sorry that you are not feeling well.  Here is how we plan to help! Based on what you have shared with me it looks like you may have possible exposure to a virus that causes influenza.  Influenza or "the flu" is   an infection caused by a respiratory virus. The flu virus is highly contagious and persons who did not receive their yearly flu vaccination may "catch" the flu from close contact.  We have anti-viral medications to treat the viruses that cause this infection. They are not a "cure" and only shorten the course of the infection. These prescriptions are most effective when they are given within the first 2 days of "flu" symptoms. Antiviral medication are indicated if you have a high risk of complications from the flu. You should  also consider an antiviral medication if you are in close contact with someone who is at risk. These medications can help patients avoid complications from the flu  but have side effects that you should know. Possible side effects from Tamiflu or oseltamivir include nausea, vomiting, diarrhea, dizziness, headaches, eye redness, sleep problems or other respiratory symptoms. You should not take Tamiflu if you have an allergy to oseltamivir or any to the ingredients in Tamiflu.  Based upon your symptoms and potential risk factors I have prescribed Oseltamivir (Tamiflu).  It has been sent to your designated pharmacy.  You will take one 75 mg capsule orally twice a day for the next 5 days.  ANYONE WHO HAS FLU SYMPTOMS SHOULD: . Stay home. The flu is  highly contagious and going out or to work exposes others! . Be sure to drink plenty of fluids. Water is fine as well as fruit juices, sodas and electrolyte beverages. You may want to stay away from caffeine or alcohol. If you are nauseated, try taking small sips of liquids. How do you know if you are getting enough fluid? Your urine should be a pale yellow or almost colorless. . Get rest. . Taking a steamy shower or using a humidifier may help nasal congestion and ease sore throat pain. Using a saline nasal spray works much the same way. . Cough drops, hard candies and sore throat lozenges may ease your cough. . Line up a caregiver. Have someone check on you regularly.   GET HELP RIGHT AWAY IF: . You cannot keep down liquids or your medications. . You become short of breath . Your fell like you are going to pass out or loose consciousness. . Your symptoms persist after you have completed your treatment plan MAKE SURE YOU   Understand these instructions.  Will watch your condition.  Will get help right away if you are not doing well or get worse.  Your e-visit answers were reviewed by a board certified advanced clinical practitioner to complete your personal care plan.  Depending on the condition, your plan could have included both over the counter or prescription medications.  If there is a problem please reply  once you have  received a response from your provider.  Your safety is important to [place].  If you have drug allergies check your prescription carefully.    You can use MyChart to ask questions about today's visit, request a non-urgent call back, or ask for a work or school excuse for 24 hours related to this e-Visit. If it has been greater than 24 hours you will need to follow up with your provider, or enter a new e-Visit to address those concerns.  You will get an e-mail in the next two days asking about your experience.  I hope that your e-visit has been valuable and will speed  your recovery. Thank you for using e-visits.

## 2017-12-24 DIAGNOSIS — M791 Myalgia, unspecified site: Secondary | ICD-10-CM | POA: Diagnosis not present

## 2018-01-06 MED FILL — DULoxetine HCL 60 MG CPEP: 60 | 30 days supply | Qty: 30 | Fill #3

## 2018-01-13 DIAGNOSIS — K5900 Constipation, unspecified: Secondary | ICD-10-CM | POA: Diagnosis not present

## 2018-01-13 DIAGNOSIS — Z01419 Encounter for gynecological examination (general) (routine) without abnormal findings: Secondary | ICD-10-CM | POA: Diagnosis not present

## 2018-01-21 DIAGNOSIS — M791 Myalgia, unspecified site: Secondary | ICD-10-CM | POA: Diagnosis not present

## 2018-01-30 ENCOUNTER — Other Ambulatory Visit: Payer: Self-pay | Admitting: Family Medicine

## 2018-01-31 ENCOUNTER — Encounter: Payer: Self-pay | Admitting: *Deleted

## 2018-01-31 MED ORDER — ALPRAZOLAM 0.5 MG PO TABS: 0.5000 mg | ORAL_TABLET | Freq: Two times a day (BID) | ORAL | 0 refills | Status: DC | PRN

## 2018-01-31 MED FILL — ALPRAZolam 0.5 MG TABS: 0.5 | 30 days supply | Qty: 60 | Fill #0

## 2018-01-31 NOTE — Telephone Encounter (Signed)
MyChart message has been sent asking patient to call and schedule appointment for further refills.

## 2018-01-31 NOTE — Telephone Encounter (Signed)
Last OV: 05/26/17 (acute) Last filled: 12/08/17 60 with 0

## 2018-01-31 NOTE — Telephone Encounter (Signed)
Will provide this refill but she is overdue for CPE and will need to schedule prior to any additional refills

## 2018-02-14 ENCOUNTER — Encounter: Payer: Self-pay | Admitting: Family Medicine

## 2018-02-14 ENCOUNTER — Other Ambulatory Visit: Payer: Self-pay | Admitting: General Practice

## 2018-02-14 ENCOUNTER — Ambulatory Visit: Payer: 59 | Admitting: Family Medicine

## 2018-02-14 MED ORDER — DULOXETINE HCL 60 MG PO CPEP: ORAL_CAPSULE | ORAL | 0 refills | Status: DC

## 2018-02-14 MED FILL — DULoxetine HCL 60 MG CPEP: 60 | 30 days supply | Qty: 30 | Fill #0

## 2018-02-21 DIAGNOSIS — M791 Myalgia, unspecified site: Secondary | ICD-10-CM | POA: Diagnosis not present

## 2018-03-02 MED FILL — METOPROLOL SUCCINATE ER 25: 25 | 60 days supply | Qty: 60 | Fill #1

## 2018-03-14 ENCOUNTER — Encounter: Payer: Self-pay | Admitting: Family Medicine

## 2018-03-21 ENCOUNTER — Encounter: Payer: Self-pay | Admitting: Family Medicine

## 2018-03-22 ENCOUNTER — Other Ambulatory Visit: Payer: Self-pay | Admitting: Family Medicine

## 2018-03-22 MED FILL — DULoxetine HCL 60 MG CPEP: 60 | 30 days supply | Qty: 30 | Fill #0

## 2018-03-22 NOTE — Telephone Encounter (Signed)
Please advise if ok to fill. Pt last seen 05/26/17, no upcoming appts.

## 2018-03-22 NOTE — Telephone Encounter (Signed)
Will approve script but she needs to schedule CPE

## 2018-03-23 ENCOUNTER — Telehealth: Payer: Self-pay | Admitting: Family Medicine

## 2018-03-23 ENCOUNTER — Ambulatory Visit: Payer: 59 | Admitting: Family Medicine

## 2018-03-23 DIAGNOSIS — M791 Myalgia, unspecified site: Secondary | ICD-10-CM | POA: Diagnosis not present

## 2018-03-23 DIAGNOSIS — Z0289 Encounter for other administrative examinations: Secondary | ICD-10-CM

## 2018-03-23 NOTE — Telephone Encounter (Signed)
Cymbalta was filled yesterday.  She has 30 days to schedule another appt.  However, I don't appreciate being told how to refill medications nor the attitude and disrespectful behavior in the lobby.  Based on this, I am in agreement that she should find a new provider.

## 2018-03-23 NOTE — Telephone Encounter (Signed)
Pt came in at 9:45 for her 9:30 appt. States that she called in to say she was going to be 6 mins late, however was later than that. When I advised her that we would need to reschedule her appt she got upset and was being disrespectful in lobby, stating that she could not help traffic and how ridiculous this was that she could not be seen and how she has had to wait 20 mins or more on Dr. Birdie Riddle at times when she has an appt in the passed. I said I can get a message back to her to let her know this, she said good tell her, better I will. Another lady was with her stated how she needed to find another doctor that she can never get in to see Dr. Birdie Riddle when she needs too. I offered to reschedule her appt and before I could finish saying that sentence pt said I am not rescheduling my appt. I needed this appt so my meds could be refilled, so tell her to refill it unless she just wants me to stop taking my cymbalta. Again I stated I would send a message and ask, but without an appt I was not sure that Rx would be called in, that that was up to the provider. Pt appt was scheduled for an acute visit, when I called to  schedule appt for pt she never stated that she needed meds refilled.

## 2018-03-24 ENCOUNTER — Telehealth: Payer: Self-pay | Admitting: Family Medicine

## 2018-03-24 NOTE — Telephone Encounter (Signed)
Copied from [place] [id number]. Topic: Quick Communication - Office Called Patient >> Mar 24, 2018  4:43 PM Dorna Bloom I wrote: Reason for CRM: Pt Call Back Called pt to advise that she will have to have an office visit or no further refills will be provided for her Cymbalta. I had to leave her a message so if she returns call she needs a 15 min OV for a follow up on her Cymbalta.   Agree w/ message left for pt  Annye Asa, MD

## 2018-04-09 ENCOUNTER — Encounter: Payer: Self-pay | Admitting: Family Medicine

## 2018-04-12 ENCOUNTER — Telehealth: Payer: Self-pay | Admitting: Family Medicine

## 2018-04-12 NOTE — Telephone Encounter (Signed)
Called patient to advise her to arrive at 10 AM for her 1015 appointment. I also let her know that as a one time courtesy we would waive the last no-show fee for her. I advised her that if she was unable to provide 24 hours notice for a cancellation or arrived late to the office that the no-show fee would not be able to be removed going forward. Also let her know that though she listed several reasons/conditions that she would like addressed during her an office visit, time may not allow for it. I stressed that Dr. Birdie Riddle  would certainly try as best she could to discuss the issues but that time may not permit. Advised the patient to make a list of most pressing concerns.

## 2018-04-13 ENCOUNTER — Other Ambulatory Visit: Payer: Self-pay

## 2018-04-13 ENCOUNTER — Encounter: Payer: Self-pay | Admitting: Family Medicine

## 2018-04-13 ENCOUNTER — Ambulatory Visit (INDEPENDENT_AMBULATORY_CARE_PROVIDER_SITE_OTHER): Payer: 59 | Admitting: Family Medicine

## 2018-04-13 ENCOUNTER — Other Ambulatory Visit: Payer: Self-pay | Admitting: General Practice

## 2018-04-13 VITALS — BP 120/80 | HR 75 | Temp 97.9°F | Resp 18 | Ht 68.0 in | Wt 200.2 lb

## 2018-04-13 DIAGNOSIS — R5383 Other fatigue: Secondary | ICD-10-CM | POA: Diagnosis not present

## 2018-04-13 DIAGNOSIS — F418 Other specified anxiety disorders: Secondary | ICD-10-CM

## 2018-04-13 LAB — CBC WITH DIFFERENTIAL/PLATELET
BASOS ABS: 0.1 10*3/uL (ref 0.0–0.1)
BASOS PCT: 0.8 % (ref 0.0–3.0)
Eosinophils Absolute: 0.2 10*3/uL (ref 0.0–0.7)
Eosinophils Relative: 2.3 % (ref 0.0–5.0)
HCT: 41.2 % (ref 36.0–46.0)
Hemoglobin: 13.9 g/dL (ref 12.0–15.0)
LYMPHS ABS: 3.3 10*3/uL (ref 0.7–4.0)
Lymphocytes Relative: 40.7 % (ref 12.0–46.0)
MCHC: 33.7 g/dL (ref 30.0–36.0)
MCV: 91.5 fl (ref 78.0–100.0)
MONOS PCT: 7 % (ref 3.0–12.0)
Monocytes Absolute: 0.6 10*3/uL (ref 0.1–1.0)
NEUTROS ABS: 4 10*3/uL (ref 1.4–7.7)
NEUTROS PCT: 49.2 % (ref 43.0–77.0)
PLATELETS: 260 10*3/uL (ref 150.0–400.0)
RBC: 4.5 Mil/uL (ref 3.87–5.11)
RDW: 14 % (ref 11.5–15.5)
WBC: 8.1 10*3/uL (ref 4.0–10.5)

## 2018-04-13 LAB — BASIC METABOLIC PANEL
BUN: 14 mg/dL (ref 6–23)
CHLORIDE: 104 meq/L (ref 96–112)
CO2: 27 mEq/L (ref 19–32)
Calcium: 9.4 mg/dL (ref 8.4–10.5)
Creatinine, Ser: 0.69 mg/dL (ref 0.40–1.20)
GFR: 94.9 mL/min (ref >60.00)
Glucose, Bld: 118 mg/dL — ABNORMAL HIGH (ref 70–99)
POTASSIUM: 3.9 meq/L (ref 3.5–5.1)
Sodium: 139 mEq/L (ref 135–145)

## 2018-04-13 LAB — HEPATIC FUNCTION PANEL
ALBUMIN: 4.5 g/dL (ref 3.5–5.2)
ALK PHOS: 72 U/L (ref 39–117)
ALT: 16 U/L (ref 0–35)
AST: 17 U/L (ref 0–37)
BILIRUBIN DIRECT: 0.1 mg/dL (ref 0.0–0.3)
TOTAL PROTEIN: 7 g/dL (ref 6.0–8.3)
Total Bilirubin: 0.6 mg/dL (ref 0.2–1.2)

## 2018-04-13 LAB — VITAMIN D 25 HYDROXY (VIT D DEFICIENCY, FRACTURES): VITD: 23.62 ng/mL — ABNORMAL LOW (ref 30.00–100.00)

## 2018-04-13 LAB — B12 AND FOLATE PANEL
FOLATE: 22.9 ng/mL (ref >5.9)
Vitamin B-12: 399 pg/mL (ref 211–911)

## 2018-04-13 LAB — TSH: TSH: 2.3 u[IU]/mL (ref 0.35–4.50)

## 2018-04-13 MED ORDER — ALPRAZOLAM 0.5 MG PO TABS: 0.5000 mg | ORAL_TABLET | Freq: Two times a day (BID) | ORAL | 3 refills | Status: DC | PRN

## 2018-04-13 MED ORDER — METOPROLOL SUCCINATE ER 25 MG PO TB24: 25.0000 mg | ORAL_TABLET | Freq: Every day | ORAL | 1 refills | Status: DC

## 2018-04-13 MED ORDER — DULOXETINE HCL 60 MG PO CPEP: ORAL_CAPSULE | ORAL | 1 refills | Status: DC

## 2018-04-13 MED ORDER — BUPROPION HCL ER (XL) 150 MG PO TB24: 150.0000 mg | ORAL_TABLET | Freq: Every day | ORAL | 3 refills | Status: DC

## 2018-04-13 MED FILL — ALPRAZolam 0.5 MG TABS: 0.5 | 30 days supply | Qty: 60 | Fill #0

## 2018-04-13 MED FILL — buPROPion HCL ER (XL) 150 M: 150 | 30 days supply | Qty: 30 | Fill #0

## 2018-04-13 NOTE — Progress Notes (Signed)
   Subjective:    Patient ID: Melissa Morgan, female    DOB: 1966/04/22, 52 y.o.   MRN: [id number]  HPI Depression- pt scored a 15 on her depression screen despite being on Cymbalta.  Pt reports nausea related to stress.  'i'm not having a good day today'.  Pt is open to adding Wellbutrin.  'I've been like this since my divorce'.  'I just work and go home'.  Having panic attacks- not able to drive on the highway.  Pt has not seen counselor in 6 months  Fatigue- 'a lot, a lot of fatigue.  It's a daily struggle'.  Pt is working night shift.  Pt reports increased leg cramps when she tries to sleep during the day- admits to poor water intake.   Review of Systems For ROS see HPI     Objective:   Physical Exam  Constitutional: She is oriented to person, place, and time. She appears well-developed and well-nourished. No distress.  HENT:  Head: Normocephalic and atraumatic.  Eyes: Pupils are equal, round, and reactive to light. Conjunctivae and EOM are normal.  Neck: Normal range of motion. Neck supple. No thyromegaly present.  Cardiovascular: Normal rate, regular rhythm, normal heart sounds and intact distal pulses.  No murmur heard. Pulmonary/Chest: Effort normal and breath sounds normal. No respiratory distress.  Abdominal: Soft. She exhibits no distension. There is no tenderness.  Musculoskeletal: She exhibits no edema.  Lymphadenopathy:    She has no cervical adenopathy.  Neurological: She is alert and oriented to person, place, and time.  Skin: Skin is warm and dry.  Psychiatric: She has a normal mood and affect. Her behavior is normal.  Vitals reviewed.         Assessment & Plan:

## 2018-04-13 NOTE — Telephone Encounter (Signed)
Pt asked if we could send in refills for the medications you prescribe. These were pended.

## 2018-04-13 NOTE — Telephone Encounter (Signed)
My bigger concern was her behavior in the office and stating loudly that she was going to find a new provider.

## 2018-04-13 NOTE — Patient Instructions (Signed)
Follow up in 3-4 weeks to recheck mood (30 min appt) We'll notify you of your lab results and make any changes if needed ADD the Wellbutrin once daily to improve mood and energy CONTINUE the Cymbalta Add a daily allergy medication Increase your fluid intake Call with any questions or concerns Hang in there!

## 2018-04-14 ENCOUNTER — Other Ambulatory Visit: Payer: Self-pay | Admitting: General Practice

## 2018-04-14 ENCOUNTER — Encounter: Payer: Self-pay | Admitting: Family Medicine

## 2018-04-14 ENCOUNTER — Other Ambulatory Visit (INDEPENDENT_AMBULATORY_CARE_PROVIDER_SITE_OTHER): Payer: 59

## 2018-04-14 DIAGNOSIS — R7309 Other abnormal glucose: Secondary | ICD-10-CM | POA: Diagnosis not present

## 2018-04-14 LAB — HEMOGLOBIN A1C: HEMOGLOBIN A1C: 6.2 % (ref 4.6–6.5)

## 2018-04-14 MED ORDER — VITAMIN D (ERGOCALCIFEROL) 1.25 MG (50000 UNIT) PO CAPS: 50000.0000 [IU] | ORAL_CAPSULE | ORAL | 0 refills | Status: DC

## 2018-04-14 MED FILL — VIT D2 1.25 MG (50,000 UNIT: 1.25 MG | 84 days supply | Qty: 12 | Fill #0

## 2018-04-15 MED FILL — DULoxetine HCL 60 MG CPEP: 60 | 90 days supply | Qty: 90 | Fill #0

## 2018-04-23 DIAGNOSIS — M791 Myalgia, unspecified site: Secondary | ICD-10-CM | POA: Diagnosis not present

## 2018-04-25 NOTE — Assessment & Plan Note (Signed)
Deteriorated.  Suspect this is causing a large part of her fatigue.  Add Wellbutrin to her Cymbalta and monitor closely for improvement.  Pt expressed understanding and is in agreement w/ plan.

## 2018-04-25 NOTE — Assessment & Plan Note (Signed)
Recurrent problem for pt.  Suspect this is multifactorial- anxiety/depression, atypical work schedule (night shift).  Check labs to r/o metabolic cause.  Will add Wellbutrin which should improve energy level and improve mood.  Pt expressed understanding and is in agreement w/ plan.

## 2018-05-04 ENCOUNTER — Other Ambulatory Visit: Payer: Self-pay | Admitting: Hematology & Oncology

## 2018-05-04 DIAGNOSIS — I82409 Acute embolism and thrombosis of unspecified deep veins of unspecified lower extremity: Secondary | ICD-10-CM

## 2018-05-04 DIAGNOSIS — I82622 Acute embolism and thrombosis of deep veins of left upper extremity: Secondary | ICD-10-CM

## 2018-05-04 MED ORDER — RIVAROXABAN 15 MG PO TABS: ORAL_TABLET | ORAL | 0 refills | Status: DC

## 2018-05-04 MED FILL — METOPROLOL SUCCINATE ER 25: 25 | 90 days supply | Qty: 90 | Fill #0

## 2018-05-11 ENCOUNTER — Other Ambulatory Visit: Payer: Self-pay

## 2018-05-11 DIAGNOSIS — I82622 Acute embolism and thrombosis of deep veins of left upper extremity: Secondary | ICD-10-CM

## 2018-05-11 DIAGNOSIS — I82409 Acute embolism and thrombosis of unspecified deep veins of unspecified lower extremity: Secondary | ICD-10-CM

## 2018-05-11 MED ORDER — RIVAROXABAN 15 MG PO TABS: ORAL_TABLET | ORAL | 3 refills | Status: DC

## 2018-05-11 MED FILL — XARELTO 15 MG TABLET: 15 | 60 days supply | Qty: 30 | Fill #0 | Status: TO

## 2018-05-23 DIAGNOSIS — M791 Myalgia, unspecified site: Secondary | ICD-10-CM | POA: Diagnosis not present

## 2018-06-16 MED FILL — ALPRAZolam 0.5 MG TABS: 0.5 | 30 days supply | Qty: 60 | Fill #1

## 2018-06-23 DIAGNOSIS — M791 Myalgia, unspecified site: Secondary | ICD-10-CM | POA: Diagnosis not present

## 2018-07-15 MED FILL — XARELTO 15 MG TABLET: 15 | 60 days supply | Qty: 30 | Fill #0

## 2018-07-24 DIAGNOSIS — M791 Myalgia, unspecified site: Secondary | ICD-10-CM | POA: Diagnosis not present

## 2018-07-27 DIAGNOSIS — M6289 Other specified disorders of muscle: Secondary | ICD-10-CM | POA: Diagnosis not present

## 2018-07-27 DIAGNOSIS — R1030 Lower abdominal pain, unspecified: Secondary | ICD-10-CM | POA: Diagnosis not present

## 2018-07-27 DIAGNOSIS — I82509 Chronic embolism and thrombosis of unspecified deep veins of unspecified lower extremity: Secondary | ICD-10-CM | POA: Diagnosis not present

## 2018-07-27 DIAGNOSIS — G4733 Obstructive sleep apnea (adult) (pediatric): Secondary | ICD-10-CM | POA: Diagnosis not present

## 2018-07-27 DIAGNOSIS — K921 Melena: Secondary | ICD-10-CM | POA: Diagnosis not present

## 2018-07-28 DIAGNOSIS — K8020 Calculus of gallbladder without cholecystitis without obstruction: Secondary | ICD-10-CM | POA: Diagnosis not present

## 2018-07-28 DIAGNOSIS — R1031 Right lower quadrant pain: Secondary | ICD-10-CM | POA: Diagnosis not present

## 2018-07-28 DIAGNOSIS — R918 Other nonspecific abnormal finding of lung field: Secondary | ICD-10-CM | POA: Diagnosis not present

## 2018-07-28 DIAGNOSIS — K5730 Diverticulosis of large intestine without perforation or abscess without bleeding: Secondary | ICD-10-CM | POA: Diagnosis not present

## 2018-07-28 DIAGNOSIS — K921 Melena: Secondary | ICD-10-CM | POA: Diagnosis not present

## 2018-08-01 DIAGNOSIS — R110 Nausea: Secondary | ICD-10-CM | POA: Diagnosis not present

## 2018-08-01 DIAGNOSIS — K921 Melena: Secondary | ICD-10-CM | POA: Diagnosis not present

## 2018-08-01 DIAGNOSIS — R194 Change in bowel habit: Secondary | ICD-10-CM | POA: Diagnosis not present

## 2018-08-01 DIAGNOSIS — R5383 Other fatigue: Secondary | ICD-10-CM | POA: Diagnosis not present

## 2018-08-01 DIAGNOSIS — R232 Flushing: Secondary | ICD-10-CM | POA: Diagnosis not present

## 2018-08-01 DIAGNOSIS — R1030 Lower abdominal pain, unspecified: Secondary | ICD-10-CM | POA: Diagnosis not present

## 2018-08-08 MED FILL — DULoxetine HCL 60 MG CPEP: 60 | 90 days supply | Qty: 90 | Fill #1

## 2018-08-08 MED FILL — METOPROLOL SUCCINATE ER 25: 25 | 90 days supply | Qty: 90 | Fill #1

## 2018-08-12 MED FILL — ALPRAZolam 0.5 MG TABS: 0.5 | 30 days supply | Qty: 60 | Fill #2

## 2018-08-23 DIAGNOSIS — M791 Myalgia, unspecified site: Secondary | ICD-10-CM | POA: Diagnosis not present

## 2018-08-27 ENCOUNTER — Emergency Department (HOSPITAL_COMMUNITY): Payer: 59

## 2018-08-27 ENCOUNTER — Encounter (HOSPITAL_COMMUNITY): Payer: Self-pay | Admitting: Emergency Medicine

## 2018-08-27 ENCOUNTER — Other Ambulatory Visit: Payer: Self-pay

## 2018-08-27 ENCOUNTER — Emergency Department (HOSPITAL_COMMUNITY)
Admission: EM | Admit: 2018-08-27 | Discharge: 2018-08-27 | Disposition: A | Discharge status: Home / Self Care | Payer: 59 | Attending: Emergency Medicine | Admitting: Emergency Medicine

## 2018-08-27 DIAGNOSIS — Z87891 Personal history of nicotine dependence: Secondary | ICD-10-CM | POA: Insufficient documentation

## 2018-08-27 DIAGNOSIS — R51 Headache: Secondary | ICD-10-CM | POA: Insufficient documentation

## 2018-08-27 DIAGNOSIS — Z79899 Other long term (current) drug therapy: Secondary | ICD-10-CM | POA: Insufficient documentation

## 2018-08-27 DIAGNOSIS — R519 Headache, unspecified: Secondary | ICD-10-CM

## 2018-08-27 NOTE — ED Triage Notes (Addendum)
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  States she started coughing while working and then had sudden onset of pain to L forehead and L ear 15 min ago.  No neuro deficits noted.  Denies nausea.  Denies dizziness.  Pt has ice pack on head and states it seems that headache is getting better.

## 2018-08-27 NOTE — ED Provider Notes (Signed)
[facility] EMERGENCY DEPARTMENT Provider Note   CSN: [id number] Arrival date & time: 08/27/18  0255     History   Chief Complaint Chief Complaint  Patient presents with  . Headache    HPI Melissa Morgan is a 52 y.o. female.  The history is provided by the patient.  She has history of cerebral aneurysm, stroke, anxiety and comes in with a severe headache.  Headache started at 2:30 AM with abrupt onset of sharp pain in the left frontotemporal area.  Headache quickly worsened to the point where is rated at 8/10.  There is mild nausea, but she denies photophobia or phonophobia.  She denies any weakness, numbness, tingling.  Headache is subsiding, and she now rates it at 2/10.  She states she has never had a headache like this before.  Past Medical History:  Diagnosis Date  . Anxiety   . Arthritis   . Brain aneurysm   . Depression   . DVT (deep venous thrombosis) ([place])   . Endometriosis   . GERD (gastroesophageal reflux disease)    occ  . Headache   . Lacunar infarction ([place])   . PONV (postoperative nausea and vomiting)    extremely sick  . Seizures ([place])    tx 5 yrs in middle school  continuous  . Stroke ([place])    cva  14  . Tachycardia     Patient Active Problem List   Diagnosis Date Noted  . Fatigue 08/06/2016  . Loose stools 03/02/2016  . Diffuse abdominal pain 03/02/2016  . Chronic daily headache 04/14/2015  . Aneurysm, cerebral, nonruptured 02/12/2015  . Worsening headaches 02/12/2015  . Breast pain, right 12/19/2014  . Polyarthralgia 12/19/2014  . Chest pain 10/01/2014  . Nausea without vomiting 10/01/2014  . GERD (gastroesophageal reflux disease) 07/30/2014  . History of DVT (deep vein thrombosis) 07/23/2014  . Cerebral aneurysm without rupture 07/06/2014  . Depression with anxiety 03/23/2014    Past Surgical History:  Procedure Laterality Date  . BREAST BIOPSY Right   . CRANIOTOMY Right 07/06/2014   Procedure: CRANIOTOMY  INTRACRANIAL ANEURYSM FOR CAROTID;  Surgeon: Ashok Pall, MD;  Location: [facility];  Service: Neurosurgery;  Laterality: Right;  right   . lasar laparoscopy     for endometriosis x3     OB History   None      Home Medications    Prior to Admission medications   Medication Sig Start Date End Date Taking? Authorizing Provider  acetaminophen (TYLENOL) 500 MG tablet Take by mouth.    [provider]  ALPRAZolam [name]) 0.5 MG tablet Take 1 tablet (0.5 mg total) by mouth 2 (two) times daily as needed. for anxiety 04/13/18   Midge Minium, MD  buPROPion (WELLBUTRIN XL) 150 MG 24 hr tablet Take 1 tablet (150 mg total) by mouth daily. 04/13/18   Midge Minium, MD  Cholecalciferol 2000 units CAPS Take 1 capsule (2,000 Units total) by mouth daily. 08/07/16   Midge Minium, MD  co-enzyme Q-10 30 MG capsule Take 30 mg by mouth 3 (three) times daily.    [provider]  DULoxetine (CYMBALTA) 60 MG capsule TAKE 1 CAPSULE BY MOUTH ONCE DAILY 04/13/18   Midge Minium, MD  magnesium oxide (MAG-OX) 400 MG tablet Take 400 mg by mouth daily.    [provider]  metoprolol succinate (TOPROL-XL) 25 MG 24 hr tablet Take 1 tablet (25 mg total) by mouth daily. 04/13/18   Tabori,  Aundra Millet, MD  riboflavin (VITAMIN B-2) 100 MG TABS tablet Take 100 mg by mouth daily.    [provider]  Rivaroxaban (XARELTO) 15 MG TABS tablet Take 1/2 pill a day. 05/11/18   Volanda Napoleon, MD  Vitamin D, Ergocalciferol, (DRISDOL) 50000 units CAPS capsule Take 1 capsule (50,000 Units total) by mouth every 7 (seven) days. 04/14/18   Midge Minium, MD    Family History Family History  Problem Relation Age of Onset  . Hypertension Mother   . Heart attack Father   . Hypertension Brother        2 out of 3 brothers  . Heart attack Brother        2 out of 3 brothers  . Diabetes Brother   . Asthma Sister   . Breast cancer Sister     Social History Social History    Tobacco Use  . Smoking status: Former Smoker    Packs/day: 0.00    Years: 30.00    Pack years: 0.00    Start date: 12/28/1983    Last attempt to quit: 05/26/2014    Years since quitting: 4.2  . Smokeless tobacco: Never Used  . Tobacco comment: quit smoking 3 months ago  Substance Use Topics  . Alcohol use: Yes    Comment: socially  . Drug use: No     Allergies   Imitrex [sumatriptan]; Dilaudid [hydromorphone hcl]; Ace inhibitors; Ondansetron; Tramadol; Codeine; and Latex   Review of Systems Review of Systems  All other systems reviewed and are negative.    Physical Exam Updated Vital Signs BP 130/86 (BP Location: Right Arm)   Pulse 95   Temp 98.1 F (36.7 C) (Oral)   Resp 18   Ht 5\' 8"  (1.727 m)   Wt 89.4 kg   LMP 12/28/2012   SpO2 100%   BMI 29.95 kg/m   Physical Exam  Nursing note and vitals reviewed.  52 year old female, resting comfortably and in no acute distress. Vital signs are normal. Oxygen saturation is 100%, which is normal. Head is normocephalic and atraumatic. PERRLA, EOMI. Oropharynx is clear. Neck is nontender and supple without adenopathy or JVD. Back is nontender and there is no CVA tenderness. Lungs are clear without rales, wheezes, or rhonchi. Chest is nontender. Heart has regular rate and rhythm without murmur. Abdomen is soft, flat, nontender without masses or hepatosplenomegaly and peristalsis is normoactive. Extremities have no cyanosis or edema, full range of motion is present. Skin is warm and dry without rash. Neurologic: Mental status is normal, cranial nerves are intact, there are no motor or sensory deficits.  ED Treatments / Results   Radiology Ct Head Wo Contrast  Result Date: 08/27/2018 CLINICAL DATA:  52 y/o F; acute left frontal headache. Status post craniotomy for years ago. History of stroke. EXAM: CT HEAD WITHOUT CONTRAST TECHNIQUE: Contiguous axial images were obtained from the base of the skull through the vertex  without intravenous contrast. COMPARISON:  04/28/2017 CT head. 01/15/2015 MRI head. FINDINGS: Brain: No evidence of acute infarction, hemorrhage, hydrocephalus, extra-axial collection or mass lesion/mass effect. Few nonspecific white matter hypodensities are compatible with mild chronic microvascular ischemic changes. Stable small chronic cortical infarction in the right anterolateral frontal lobe. Vascular: Stable aneurysm clips within the right MCA cistern. Skull: Right frontotemporal craniotomy chronic postsurgical changes. No acute osseous abnormality identified. Sinuses/Orbits: Visible paranasal sinuses and mastoid air cells are normally aerated. Orbits are unremarkable. Other: None. IMPRESSION: 1. No acute intracranial abnormality identified.  2. Stable mild chronic microvascular ischemic changes of the brain. 3. Stable small chronic cortical infarction of the right anterolateral frontal lobe. 4. Stable chronic postsurgical changes related to right frontotemporal craniotomy. MCA cistern aneurysm clips again noted. Electronically Signed   By: Kristine Garbe M.D.   On: 08/27/2018 05:37    Procedures Procedures  Medications Ordered in ED Medications - No data to display   Initial Impression / Assessment and Plan / ED Course  I have reviewed the triage vital signs and the nursing notes.  Pertinent imaging results that were available during my care of the patient were reviewed by me and considered in my medical decision making (see chart for details).  Severe headache of uncertain cause, possible migraine variant.  History of right middle cerebral artery aneurysm which has been treated with surgical clips.  Because of severe nature of headache, she will be sent for CT scan.  I have suggested giving her a headache cocktail, but she has refused.  Old records are reviewed, and she does have several ED visits for headaches treated with migraine cocktail.  Most recent CT of head was from June  2018.  CT of head shows no acute process.  Headache is continued to improve.  She is discharged.  Final Clinical Impressions(s) / ED Diagnoses   Final diagnoses:  Bad headache    ED Discharge Orders    None       Delora Fuel, MD 67/59/16 [phone]

## 2018-08-27 NOTE — Discharge Instructions (Signed)
Return if you have any problems.

## 2018-08-27 NOTE — ED Notes (Addendum)
Pt discharged from ED; instructions provided; Pt encouraged to return to ED if symptoms worsen and to f/u with PCP; Pt verbalized understanding of all instructions 

## 2018-08-30 DIAGNOSIS — G43109 Migraine with aura, not intractable, without status migrainosus: Secondary | ICD-10-CM | POA: Diagnosis not present

## 2018-08-30 DIAGNOSIS — K921 Melena: Secondary | ICD-10-CM | POA: Diagnosis not present

## 2018-08-30 DIAGNOSIS — I82509 Chronic embolism and thrombosis of unspecified deep veins of unspecified lower extremity: Secondary | ICD-10-CM | POA: Diagnosis not present

## 2018-08-30 MED FILL — PROMETHAZINE 25 MG TABLET: 25 | 7 days supply | Qty: 30 | Fill #0

## 2018-09-20 MED FILL — XARELTO 15 MG TABLET: 15 | 60 days supply | Qty: 30 | Fill #0

## 2018-09-23 DIAGNOSIS — M791 Myalgia, unspecified site: Secondary | ICD-10-CM | POA: Diagnosis not present

## 2018-10-13 ENCOUNTER — Other Ambulatory Visit: Payer: Self-pay | Admitting: General Practice

## 2018-10-13 MED ORDER — ALPRAZOLAM 0.5 MG PO TABS: 0.5000 mg | ORAL_TABLET | Freq: Two times a day (BID) | ORAL | 3 refills | Status: DC | PRN

## 2018-10-13 MED FILL — ALPRAZolam 0.5 MG TABS: 0.5 | 30 days supply | Qty: 60 | Fill #0

## 2018-10-13 NOTE — Telephone Encounter (Signed)
Last OV 04/13/18 Alprazolam last filled 04/13/18 #60 with 3

## 2018-10-17 DIAGNOSIS — K2980 Duodenitis without bleeding: Secondary | ICD-10-CM | POA: Diagnosis not present

## 2018-10-17 DIAGNOSIS — D125 Benign neoplasm of sigmoid colon: Secondary | ICD-10-CM | POA: Diagnosis not present

## 2018-10-17 DIAGNOSIS — K5730 Diverticulosis of large intestine without perforation or abscess without bleeding: Secondary | ICD-10-CM | POA: Diagnosis not present

## 2018-10-17 DIAGNOSIS — R110 Nausea: Secondary | ICD-10-CM | POA: Diagnosis not present

## 2018-10-17 DIAGNOSIS — R197 Diarrhea, unspecified: Secondary | ICD-10-CM | POA: Diagnosis not present

## 2018-10-17 DIAGNOSIS — K2270 Barrett's esophagus without dysplasia: Secondary | ICD-10-CM | POA: Diagnosis not present

## 2018-10-17 DIAGNOSIS — K2930 Chronic superficial gastritis without bleeding: Secondary | ICD-10-CM | POA: Diagnosis not present

## 2018-10-17 DIAGNOSIS — R194 Change in bowel habit: Secondary | ICD-10-CM | POA: Diagnosis not present

## 2018-10-17 DIAGNOSIS — R1030 Lower abdominal pain, unspecified: Secondary | ICD-10-CM | POA: Diagnosis not present

## 2018-10-17 DIAGNOSIS — K648 Other hemorrhoids: Secondary | ICD-10-CM | POA: Diagnosis not present

## 2018-10-17 DIAGNOSIS — K921 Melena: Secondary | ICD-10-CM | POA: Diagnosis not present

## 2018-10-20 MED FILL — OMEPRAZOLE 20 MG CPDR: 20 | 90 days supply | Qty: 90 | Fill #0

## 2018-10-23 DIAGNOSIS — M791 Myalgia, unspecified site: Secondary | ICD-10-CM | POA: Diagnosis not present

## 2018-11-16 ENCOUNTER — Other Ambulatory Visit: Payer: Self-pay | Admitting: Family Medicine

## 2018-11-22 MED FILL — METOPROLOL SUCCINATE ER 25: 25 | 30 days supply | Qty: 30 | Fill #0

## 2018-11-23 ENCOUNTER — Other Ambulatory Visit: Payer: Self-pay | Admitting: Physician Assistant

## 2018-11-23 DIAGNOSIS — Z1231 Encounter for screening mammogram for malignant neoplasm of breast: Secondary | ICD-10-CM

## 2018-11-23 DIAGNOSIS — M791 Myalgia, unspecified site: Secondary | ICD-10-CM | POA: Diagnosis not present

## 2018-11-28 ENCOUNTER — Other Ambulatory Visit: Payer: Self-pay | Admitting: Family Medicine

## 2018-11-28 MED FILL — DULOXETINE HCL 60 MG CPEP: 60 | 30 days supply | Qty: 30 | Fill #0

## 2018-11-28 NOTE — Telephone Encounter (Signed)
Last OV 04/13/18 Duloxetine last filled 04/13/18 #90 with 1  No upcoming appts.

## 2018-12-09 ENCOUNTER — Emergency Department (HOSPITAL_BASED_OUTPATIENT_CLINIC_OR_DEPARTMENT_OTHER): Payer: 59

## 2018-12-09 ENCOUNTER — Emergency Department (HOSPITAL_BASED_OUTPATIENT_CLINIC_OR_DEPARTMENT_OTHER)
Admission: EM | Admit: 2018-12-09 | Discharge: 2018-12-09 | Disposition: A | Discharge status: Home / Self Care | Payer: 59 | Attending: Emergency Medicine | Admitting: Emergency Medicine

## 2018-12-09 ENCOUNTER — Encounter (HOSPITAL_BASED_OUTPATIENT_CLINIC_OR_DEPARTMENT_OTHER): Payer: Self-pay | Admitting: Emergency Medicine

## 2018-12-09 ENCOUNTER — Other Ambulatory Visit: Payer: Self-pay

## 2018-12-09 DIAGNOSIS — Z79899 Other long term (current) drug therapy: Secondary | ICD-10-CM | POA: Diagnosis not present

## 2018-12-09 DIAGNOSIS — Z91040 Latex allergy status: Secondary | ICD-10-CM | POA: Insufficient documentation

## 2018-12-09 DIAGNOSIS — B9789 Other viral agents as the cause of diseases classified elsewhere: Secondary | ICD-10-CM

## 2018-12-09 DIAGNOSIS — J069 Acute upper respiratory infection, unspecified: Secondary | ICD-10-CM | POA: Diagnosis not present

## 2018-12-09 DIAGNOSIS — Z7901 Long term (current) use of anticoagulants: Secondary | ICD-10-CM | POA: Insufficient documentation

## 2018-12-09 DIAGNOSIS — Z8673 Personal history of transient ischemic attack (TIA), and cerebral infarction without residual deficits: Secondary | ICD-10-CM | POA: Diagnosis not present

## 2018-12-09 DIAGNOSIS — R05 Cough: Secondary | ICD-10-CM | POA: Diagnosis not present

## 2018-12-09 DIAGNOSIS — Z87891 Personal history of nicotine dependence: Secondary | ICD-10-CM | POA: Insufficient documentation

## 2018-12-09 MED ORDER — IPRATROPIUM-ALBUTEROL 0.5-2.5 (3) MG/3ML IN SOLN
3.0000 mL | Freq: Four times a day (QID) | RESPIRATORY_TRACT | Status: DC
  Administered 2018-12-09: 3 mL via RESPIRATORY_TRACT
  Filled 2018-12-09: qty 3

## 2018-12-09 MED ORDER — PREDNISONE 50 MG PO TABS: 50.0000 mg | ORAL_TABLET | Freq: Every day | ORAL | 0 refills | Status: AC

## 2018-12-09 MED ORDER — IPRATROPIUM BROMIDE 0.02 % IN SOLN
0.5000 mg | Freq: Once | RESPIRATORY_TRACT | Status: AC
  Administered 2018-12-09: 0.5 mg via RESPIRATORY_TRACT
  Filled 2018-12-09: qty 2.5

## 2018-12-09 MED ORDER — ALBUTEROL SULFATE HFA 108 (90 BASE) MCG/ACT IN AERS
2.0000 | INHALATION_SPRAY | Freq: Once | RESPIRATORY_TRACT | Status: AC
  Administered 2018-12-09: 2 via RESPIRATORY_TRACT
  Filled 2018-12-09: qty 6.7

## 2018-12-09 MED ORDER — ALBUTEROL SULFATE (2.5 MG/3ML) 0.083% IN NEBU
5.0000 mg | INHALATION_SOLUTION | Freq: Once | RESPIRATORY_TRACT | Status: AC
  Administered 2018-12-09: 5 mg via RESPIRATORY_TRACT
  Filled 2018-12-09: qty 6

## 2018-12-09 NOTE — ED Notes (Signed)
ED Provider at bedside. 

## 2018-12-09 NOTE — ED Provider Notes (Signed)
[facility] EMERGENCY DEPARTMENT Provider Note   CSN: [id number] Arrival date & time: 12/09/18  2036     History   Chief Complaint Chief Complaint  Patient presents with  . Cough    HPI Melissa Morgan is a 53 y.o. female.  HPI   Pt is a 53 y/o female with a h/o brain aneurysm, DVT (following surgery), GERD, lacunar infarction, seizures, CVA, who presents to the ED today for evaluation of sore throat, rhinorrhea, congestion, body aches that began 6 days ago. States that 4 days ago began to have a productive cough with green sputum. her symptoms have worsened since onset. She feels like she has become more short of breath over the course of the last few days. Describes chest tightness, but no significant or persistent pain.   Denies leg pain/swelling, recent surgery/trauma, recent long travel, hormone use, personal hx of cancer.  Stopped xarelto several months ago.  Past Medical History:  Diagnosis Date  . Anxiety   . Arthritis   . Brain aneurysm   . Depression   . DVT (deep venous thrombosis) ([place])   . Endometriosis   . GERD (gastroesophageal reflux disease)    occ  . Headache   . Lacunar infarction ([place])   . PONV (postoperative nausea and vomiting)    extremely sick  . Seizures ([place])    tx 5 yrs in middle school  continuous  . Stroke ([place])    cva  14  . Tachycardia     Patient Active Problem List   Diagnosis Date Noted  . Fatigue 08/06/2016  . Loose stools 03/02/2016  . Diffuse abdominal pain 03/02/2016  . Chronic daily headache 04/14/2015  . Aneurysm, cerebral, nonruptured 02/12/2015  . Worsening headaches 02/12/2015  . Breast pain, right 12/19/2014  . Polyarthralgia 12/19/2014  . Chest pain 10/01/2014  . Nausea without vomiting 10/01/2014  . GERD (gastroesophageal reflux disease) 07/30/2014  . History of DVT (deep vein thrombosis) 07/23/2014  . Cerebral aneurysm without rupture 07/06/2014  . Depression with anxiety 03/23/2014    Past  Surgical History:  Procedure Laterality Date  . BREAST BIOPSY Right   . CRANIOTOMY Right 07/06/2014   Procedure: CRANIOTOMY INTRACRANIAL ANEURYSM FOR CAROTID;  Surgeon: Ashok Pall, MD;  Location: [facility];  Service: Neurosurgery;  Laterality: Right;  right   . lasar laparoscopy     for endometriosis x3     OB History   No obstetric history on file.      Home Medications    Prior to Admission medications   Medication Sig Start Date End Date Taking? Authorizing Provider  acetaminophen (TYLENOL) 500 MG tablet Take by mouth.    [provider]  ALPRAZolam [name]) 0.5 MG tablet Take 1 tablet (0.5 mg total) by mouth 2 (two) times daily as needed. for anxiety 10/13/18   Midge Minium, MD  buPROPion (WELLBUTRIN XL) 150 MG 24 hr tablet Take 1 tablet (150 mg total) by mouth daily. 04/13/18   Midge Minium, MD  Cholecalciferol 2000 units CAPS Take 1 capsule (2,000 Units total) by mouth daily. 08/07/16   Midge Minium, MD  co-enzyme Q-10 30 MG capsule Take 30 mg by mouth 3 (three) times daily.    [provider]  DULoxetine (CYMBALTA) 60 MG capsule TAKE 1 CAPSULE BY MOUTH ONCE DAILY 11/28/18   Brunetta Jeans, PA-C  magnesium oxide (MAG-OX) 400 MG tablet Take 400 mg by mouth daily.    [provider]  metoprolol succinate (TOPROL-XL) 25 MG 24 hr tablet Take 1 tablet (25 mg total) by mouth daily. 04/13/18   Midge Minium, MD  predniSONE (DELTASONE) 50 MG tablet Take 1 tablet (50 mg total) by mouth daily for 5 days. 12/09/18 12/14/18  Meyer Arora S, PA-C  riboflavin (VITAMIN B-2) 100 MG TABS tablet Take 100 mg by mouth daily.    [provider]  Rivaroxaban (XARELTO) 15 MG TABS tablet Take 1/2 pill a day. 05/11/18   Volanda Napoleon, MD  Vitamin D, Ergocalciferol, (DRISDOL) 50000 units CAPS capsule Take 1 capsule (50,000 Units total) by mouth every 7 (seven) days. 04/14/18   Midge Minium, MD    Family History Family History    Problem Relation Age of Onset  . Hypertension Mother   . Heart attack Father   . Hypertension Brother        2 out of 3 brothers  . Heart attack Brother        2 out of 3 brothers  . Diabetes Brother   . Asthma Sister   . Breast cancer Sister     Social History Social History   Tobacco Use  . Smoking status: Former Smoker    Packs/day: 0.00    Years: 30.00    Pack years: 0.00    Start date: 12/28/1983    Last attempt to quit: 05/26/2014    Years since quitting: 4.5  . Smokeless tobacco: Never Used  . Tobacco comment: quit smoking 3 months ago  Substance Use Topics  . Alcohol use: Yes    Comment: socially  . Drug use: No     Allergies   Imitrex [sumatriptan]; Dilaudid [hydromorphone hcl]; Ace inhibitors; Ondansetron; Tramadol; Codeine; and Latex   Review of Systems Review of Systems  Constitutional: Positive for fatigue. Negative for fever.  HENT: Positive for congestion, rhinorrhea and sore throat.   Eyes: Negative for visual disturbance.  Respiratory: Positive for cough, shortness of breath and wheezing.   Cardiovascular: Negative for chest pain.  Gastrointestinal: Negative for abdominal pain, constipation, diarrhea, nausea and vomiting.  Genitourinary: Negative for pelvic pain.  Musculoskeletal: Negative for back pain.  Skin: Negative for rash.  Neurological: Negative for headaches.    Physical Exam Updated Vital Signs BP (!) 143/70   Pulse 77   Temp 98.3 F (36.8 C) (Oral)   Resp 18   Ht 5\' 8"  (1.727 m)   Wt 89.4 kg   LMP 12/28/2012   SpO2 98%   BMI 29.95 kg/m   Physical Exam Vitals signs and nursing note reviewed.  Constitutional:      General: She is not in acute distress.    Appearance: She is well-developed. She is not ill-appearing or toxic-appearing.  HENT:     Head: Normocephalic and atraumatic.     Right Ear: Tympanic membrane normal.     Left Ear: Tympanic membrane normal.     Nose: Nose normal.     Mouth/Throat:     Mouth: Mucous  membranes are moist.     Pharynx: No oropharyngeal exudate or posterior oropharyngeal erythema.  Eyes:     Conjunctiva/sclera: Conjunctivae normal.  Neck:     Musculoskeletal: Neck supple.  Cardiovascular:     Rate and Rhythm: Normal rate and regular rhythm.     Pulses: Normal pulses.     Heart sounds: Normal heart sounds. No murmur.  Pulmonary:     Effort: Pulmonary effort is normal.     Comments: Crackles to the  left lower lobe, rare aspiratory wheezes, decreased air movement throughout Abdominal:     General: Bowel sounds are normal.     Palpations: Abdomen is soft.     Tenderness: There is no abdominal tenderness. There is no guarding or rebound.  Musculoskeletal:     Right lower leg: No edema.     Left lower leg: No edema.  Skin:    General: Skin is warm and dry.  Neurological:     Mental Status: She is alert.      ED Treatments / Results  Labs (all labs ordered are listed, but only abnormal results are displayed) Labs Reviewed - No data to display  EKG None  Radiology Dg Chest 2 View  Result Date: 12/09/2018 CLINICAL DATA:  Cough and chest congestion. EXAM: CHEST - 2 VIEW COMPARISON:  07/06/2014 CXR and chest CT 06/18/2015 FINDINGS: The heart size and mediastinal contours are within normal limits. Both lungs are clear. Calcified granuloma in the left mid lung is redemonstrated. Right-sided noncalcified pulmonary nodules seen on prior chest CT are not radiographically apparent. No dominant mass is noted. The visualized skeletal structures are unremarkable. IMPRESSION: No active cardiopulmonary disease. Electronically Signed   By: Ashley Royalty M.D.   On: 12/09/2018 21:39    Procedures Procedures (including critical care time)  Medications Ordered in ED Medications  ipratropium-albuterol (DUONEB) 0.5-2.5 (3) MG/3ML nebulizer solution 3 mL (3 mLs Nebulization Given 12/09/18 2230)  albuterol (PROVENTIL HFA;VENTOLIN HFA) 108 (90 Base) MCG/ACT inhaler 2 puff (has no  administration in time range)  albuterol (PROVENTIL) (2.5 MG/3ML) 0.083% nebulizer solution 5 mg (5 mg Nebulization Given 12/09/18 2131)  ipratropium (ATROVENT) nebulizer solution 0.5 mg (0.5 mg Nebulization Given 12/09/18 2132)     Initial Impression / Assessment and Plan / ED Course  I have reviewed the triage vital signs and the nursing notes.  Pertinent labs & imaging results that were available during my care of the patient were reviewed by me and considered in my medical decision making (see chart for details).     Final Clinical Impressions(s) / ED Diagnoses   Final diagnoses:  Viral URI with cough   Patient presenting with URI symptoms for the last week.  She has been feeling more short of breath over the last couple of days.  No lower extremity edema.  No chest pain.  No pleuritic pain.  Vital signs within normal limits in the ED.  Satting well on room air.  She was given a DuoNeb treatment and she felt improved.  She was ambulated in the ED following this treatment and painting oxygen saturations to 90% and above.  She states she did not feel dyspneic while walking.  She does request another breathing treatment as she stated that it greatly improved her symptoms.  DuoNeb was repeated.  Patient states her chest feels more open and she no longer feels chest tightness.  Repeat lung exam reveals mild diffuse wheezes throughout.  She feels improved and would like to be discharged.  I feel that this is reasonable.  Her symptoms are likely related to a viral URI.  Will give her albuterol inhaler here to use as well as Rx for prednisone given her wheezing.  Have advised close follow-up with her PCP and close monitoring of her symptoms.  If her symptoms worsen or do not improve over the next 24-48 hours she is to return to the emergency department immediately.  Patient voices understanding the plan was return.  Questions answered.  ED  Discharge Orders         Ordered    predniSONE (DELTASONE) 50  MG tablet  Daily     12/09/18 2255           Rodney Booze, PA-C 12/09/18 2259    Margette Fast, MD 12/10/18 [phone]

## 2018-12-09 NOTE — ED Notes (Signed)
Pt ambulated with pulse ox. 92% was lowest patient got. Provider aware

## 2018-12-09 NOTE — ED Triage Notes (Signed)
Pt having cold symptoms for about a week with generalized body aches, fever, chills, persistent cough and SOB.

## 2018-12-09 NOTE — Discharge Instructions (Signed)
Please take medications as prescribed.   Please follow up with your primary care provider within 5-7 days for re-evaluation of your symptoms. Please return to the emergency department for any new or worsening symptoms.

## 2018-12-14 DIAGNOSIS — J45998 Other asthma: Secondary | ICD-10-CM | POA: Diagnosis not present

## 2018-12-14 MED FILL — predniSONE 20 MG TABS: 20 | 5 days supply | Qty: 10 | Fill #0

## 2018-12-21 ENCOUNTER — Ambulatory Visit
Admission: RE | Admit: 2018-12-21 | Discharge: 2018-12-21 | Disposition: A | Discharge status: Home / Self Care | Payer: 59 | Source: Ambulatory Visit | Attending: Physician Assistant | Admitting: Physician Assistant

## 2018-12-21 DIAGNOSIS — Z1231 Encounter for screening mammogram for malignant neoplasm of breast: Secondary | ICD-10-CM | POA: Diagnosis not present

## 2018-12-22 MED FILL — ALPRAZolam 0.5 MG TABS: 0.5 | 30 days supply | Qty: 60 | Fill #1

## 2018-12-24 DIAGNOSIS — M791 Myalgia, unspecified site: Secondary | ICD-10-CM | POA: Diagnosis not present

## 2018-12-27 MED FILL — METOPROLOL SUCCINATE ER 25: 25 | 30 days supply | Qty: 30 | Fill #1

## 2019-01-04 DIAGNOSIS — H524 Presbyopia: Secondary | ICD-10-CM | POA: Diagnosis not present

## 2019-01-11 MED FILL — DULoxetine HCL 30 MG CPEP: 30 | 90 days supply | Qty: 90 | Fill #0

## 2019-01-22 DIAGNOSIS — M791 Myalgia, unspecified site: Secondary | ICD-10-CM | POA: Diagnosis not present

## 2019-02-03 MED FILL — METOPROLOL SUCCINATE ER 25: 25 | 90 days supply | Qty: 90 | Fill #0

## 2019-02-05 ENCOUNTER — Emergency Department (HOSPITAL_COMMUNITY)
Admission: EM | Admit: 2019-02-05 | Discharge: 2019-02-05 | Disposition: A | Discharge status: Home / Self Care | Payer: 59 | Attending: Emergency Medicine | Admitting: Emergency Medicine

## 2019-02-05 ENCOUNTER — Encounter (HOSPITAL_COMMUNITY): Payer: Self-pay | Admitting: Emergency Medicine

## 2019-02-05 ENCOUNTER — Emergency Department (HOSPITAL_COMMUNITY): Payer: 59

## 2019-02-05 DIAGNOSIS — R945 Abnormal results of liver function studies: Secondary | ICD-10-CM | POA: Diagnosis not present

## 2019-02-05 DIAGNOSIS — R079 Chest pain, unspecified: Secondary | ICD-10-CM | POA: Diagnosis not present

## 2019-02-05 DIAGNOSIS — Z87891 Personal history of nicotine dependence: Secondary | ICD-10-CM | POA: Insufficient documentation

## 2019-02-05 DIAGNOSIS — K8020 Calculus of gallbladder without cholecystitis without obstruction: Secondary | ICD-10-CM | POA: Insufficient documentation

## 2019-02-05 DIAGNOSIS — R0789 Other chest pain: Secondary | ICD-10-CM | POA: Diagnosis not present

## 2019-02-05 DIAGNOSIS — R1013 Epigastric pain: Secondary | ICD-10-CM | POA: Insufficient documentation

## 2019-02-05 DIAGNOSIS — R000 Tachycardia, unspecified: Secondary | ICD-10-CM | POA: Diagnosis not present

## 2019-02-05 DIAGNOSIS — Z79899 Other long term (current) drug therapy: Secondary | ICD-10-CM | POA: Insufficient documentation

## 2019-02-05 DIAGNOSIS — Z91040 Latex allergy status: Secondary | ICD-10-CM | POA: Insufficient documentation

## 2019-02-05 DIAGNOSIS — R7989 Other specified abnormal findings of blood chemistry: Secondary | ICD-10-CM

## 2019-02-05 LAB — COMPREHENSIVE METABOLIC PANEL
ALT: 59 U/L — ABNORMAL HIGH (ref 0–44)
AST: 120 U/L — ABNORMAL HIGH (ref 15–41)
Albumin: 3.9 g/dL (ref 3.5–5.0)
Alkaline Phosphatase: 74 U/L (ref 38–126)
Anion gap: 12 (ref 5–15)
BUN: 14 mg/dL (ref 6–20)
CO2: 23 mmol/L (ref 22–32)
Calcium: 9.2 mg/dL (ref 8.9–10.3)
Chloride: 102 mmol/L (ref 98–111)
Creatinine, Ser: 0.77 mg/dL (ref 0.44–1.00)
GFR calc Af Amer: >60 mL/min (ref >60)
GFR calc non Af Amer: >60 mL/min (ref >60)
Glucose, Bld: 133 mg/dL — ABNORMAL HIGH (ref 70–99)
Potassium: 3.3 mmol/L — ABNORMAL LOW (ref 3.5–5.1)
Sodium: 137 mmol/L (ref 135–145)
Total Bilirubin: 0.7 mg/dL (ref 0.3–1.2)
Total Protein: 6.6 g/dL (ref 6.5–8.1)

## 2019-02-05 LAB — CBC WITH DIFFERENTIAL/PLATELET
Abs Immature Granulocytes: 0 10*3/uL (ref 0.00–0.07)
Basophils Absolute: 0 10*3/uL (ref 0.0–0.1)
Basophils Relative: 0 %
Eosinophils Absolute: 0.4 10*3/uL (ref 0.0–0.5)
Eosinophils Relative: 6 %
HCT: 38.2 % (ref 36.0–46.0)
Hemoglobin: 12.3 g/dL (ref 12.0–15.0)
Lymphocytes Relative: 48 %
Lymphs Abs: 3.4 10*3/uL (ref 0.7–4.0)
MCH: 29.4 pg (ref 26.0–34.0)
MCHC: 32.2 g/dL (ref 30.0–36.0)
MCV: 91.2 fL (ref 80.0–100.0)
Monocytes Absolute: 0.6 10*3/uL (ref 0.1–1.0)
Monocytes Relative: 8 %
Neutro Abs: 2.7 10*3/uL (ref 1.7–7.7)
Neutrophils Relative %: 38 %
Platelets: 229 10*3/uL (ref 150–400)
RBC: 4.19 MIL/uL (ref 3.87–5.11)
RDW: 13.4 % (ref 11.5–15.5)
WBC: 7 10*3/uL (ref 4.0–10.5)
nRBC: 0 % (ref 0.0–0.2)
nRBC: 0 /100 WBC

## 2019-02-05 LAB — D-DIMER, QUANTITATIVE: D-Dimer, Quant: <0.27 ug/mL-FEU (ref 0.00–0.50)

## 2019-02-05 LAB — TROPONIN I
Troponin I: <0.03 ng/mL (ref <0.03)
Troponin I: <0.03 ng/mL (ref <0.03)

## 2019-02-05 LAB — LIPASE, BLOOD: Lipase: 36 U/L (ref 11–51)

## 2019-02-05 MED ORDER — METOCLOPRAMIDE HCL 5 MG/ML IJ SOLN
10.0000 mg | Freq: Once | INTRAMUSCULAR | Status: AC
  Administered 2019-02-05: 10 mg via INTRAVENOUS
  Filled 2019-02-05: qty 2

## 2019-02-05 MED ORDER — HYDROCODONE-ACETAMINOPHEN 5-325 MG PO TABS: 1.0000 | ORAL_TABLET | Freq: Four times a day (QID) | ORAL | 0 refills | Status: DC | PRN

## 2019-02-05 MED ORDER — METOCLOPRAMIDE HCL 10 MG PO TABS: 10.0000 mg | ORAL_TABLET | Freq: Four times a day (QID) | ORAL | 0 refills | Status: DC

## 2019-02-05 MED ORDER — MORPHINE SULFATE (PF) 4 MG/ML IV SOLN
4.0000 mg | Freq: Once | INTRAVENOUS | Status: DC
  Filled 2019-02-05: qty 1

## 2019-02-05 MED ORDER — MORPHINE SULFATE (PF) 4 MG/ML IV SOLN
4.0000 mg | Freq: Once | INTRAVENOUS | Status: AC
  Administered 2019-02-05: 4 mg via INTRAVENOUS
  Filled 2019-02-05: qty 1

## 2019-02-05 NOTE — ED Notes (Signed)
Patient transported to X-ray 

## 2019-02-05 NOTE — ED Provider Notes (Signed)
[facility] EMERGENCY DEPARTMENT Provider Note   CSN: [id number] Arrival date & time: 02/05/19  0932    History   Chief Complaint Chief Complaint  Patient presents with  . Chest Pain    HPI Melissa Morgan is a 53 y.o. female with history of brain aneurysm, stroke, DVT who presents with chest pain that began this morning after eating bacon.  Patient developed nausea last evening which has persisted.  Patient has had radiation of the pain to her back and shoulders as well as up to her neck.  She describes the pain as a heavy pressure with some sharp pain.  It is not pleuritic.  Patient denies any new leg pain or swelling, recent long trips, surgeries.  Patient has history of DVT, but stopped Xarelto 5 months ago.  She denies any recent illness and was feeling well prior to developing symptoms in the past 12 hours.  Patient denies any vomiting or diarrhea.  Patient reports her chest pain started in her upper abdomen, however now she denies any abdominal pain.  Patient was given full dose aspirin as well as 2 nitroglycerin in route which brought her pain down to a 2/10.  She reports she still has some sharp central chest pain and trouble taking a deep breath.  Patient has significant family cardiac history.  Her father died of a heart attack at 52.  Both of her brothers have had heart attacks in their 28s or 82s.     HPI  Past Medical History:  Diagnosis Date  . Anxiety   . Arthritis   . Brain aneurysm   . Depression   . DVT (deep venous thrombosis) ([place])   . Endometriosis   . GERD (gastroesophageal reflux disease)    occ  . Headache   . Lacunar infarction ([place])   . PONV (postoperative nausea and vomiting)    extremely sick  . Seizures ([place])    tx 5 yrs in middle school  continuous  . Stroke ([place])    cva  14  . Tachycardia     Patient Active Problem List   Diagnosis Date Noted  . Fatigue 08/06/2016  . Loose stools 03/02/2016  . Diffuse abdominal  pain 03/02/2016  . Chronic daily headache 04/14/2015  . Aneurysm, cerebral, nonruptured 02/12/2015  . Worsening headaches 02/12/2015  . Breast pain, right 12/19/2014  . Polyarthralgia 12/19/2014  . Chest pain 10/01/2014  . Nausea without vomiting 10/01/2014  . GERD (gastroesophageal reflux disease) 07/30/2014  . History of DVT (deep vein thrombosis) 07/23/2014  . Cerebral aneurysm without rupture 07/06/2014  . Depression with anxiety 03/23/2014    Past Surgical History:  Procedure Laterality Date  . BREAST BIOPSY Right   . CRANIOTOMY Right 07/06/2014   Procedure: CRANIOTOMY INTRACRANIAL ANEURYSM FOR CAROTID;  Surgeon: Ashok Pall, MD;  Location: [facility];  Service: Neurosurgery;  Laterality: Right;  right   . lasar laparoscopy     for endometriosis x3     OB History   No obstetric history on file.      Home Medications    Prior to Admission medications   Medication Sig Start Date End Date Taking? Authorizing Provider  acetaminophen (TYLENOL) 500 MG tablet Take 1,000 mg by mouth every 6 (six) hours as needed for mild pain or moderate pain.    Yes [provider]  albuterol (PROVENTIL HFA;VENTOLIN HFA) 108 (90 Base) MCG/ACT inhaler Inhale 1-2 puffs into the lungs every 6 (six) hours as needed  for wheezing or shortness of breath.   Yes [provider]  ALPRAZolam (XANAX) 0.5 MG tablet Take 1 tablet (0.5 mg total) by mouth 2 (two) times daily as needed. for anxiety 10/13/18  Yes Midge Minium, MD  Ascorbic Acid (VITAMIN C) 1000 MG tablet Take 1,000 mg by mouth 2 (two) times daily.   Yes [provider]  B Complex Vitamins (VITAMIN B-COMPLEX) TABS Take 1 tablet by mouth daily.   Yes [provider]  cetirizine (ZYRTEC) 10 MG tablet Take 10 mg by mouth daily.   Yes [provider]  Cholecalciferol 2000 units CAPS Take 1 capsule (2,000 Units total) by mouth daily. 08/07/16  Yes Midge Minium, MD  DULoxetine (CYMBALTA) 30 MG  capsule Take 30 mg by mouth daily. 01/11/19  Yes [provider]  DULoxetine (CYMBALTA) 60 MG capsule TAKE 1 CAPSULE BY MOUTH ONCE DAILY Patient taking differently: Take 30 mg by mouth daily. TAKE 1 CAPSULE BY MOUTH ONCE DAILY 11/28/18  Yes Brunetta Jeans, PA-C  magnesium oxide (MAG-OX) 400 MG tablet Take 400 mg by mouth daily.   Yes [provider]  metoprolol succinate (TOPROL-XL) 25 MG 24 hr tablet Take 1 tablet (25 mg total) by mouth daily. 04/13/18  Yes Midge Minium, MD  omeprazole (PRILOSEC) 20 MG capsule Take 20 mg by mouth daily. 10/20/18  Yes [provider]  promethazine (PHENERGAN) 25 MG tablet Take 25 mg by mouth every 6 (six) hours as needed for nausea/vomiting. 08/30/18  Yes [provider]  buPROPion (WELLBUTRIN XL) 150 MG 24 hr tablet Take 1 tablet (150 mg total) by mouth daily. Patient not taking: Reported on 02/05/2019 04/13/18   Midge Minium, MD  HYDROcodone-acetaminophen (NORCO/VICODIN) 5-325 MG tablet Take 1 tablet by mouth every 6 (six) hours as needed for severe pain. 02/05/19   Galdino Hinchman, Bea Graff, PA-C  metoCLOPramide (REGLAN) 10 MG tablet Take 1 tablet (10 mg total) by mouth every 6 (six) hours. 02/05/19   Kylon Philbrook, Bea Graff, PA-C  Rivaroxaban (XARELTO) 15 MG TABS tablet Take 1/2 pill a day. Patient not taking: Reported on 02/05/2019 05/11/18   Volanda Napoleon, MD  Vitamin D, Ergocalciferol, (DRISDOL) 50000 units CAPS capsule Take 1 capsule (50,000 Units total) by mouth every 7 (seven) days. Patient not taking: Reported on 02/05/2019 04/14/18   Midge Minium, MD    Family History Family History  Problem Relation Age of Onset  . Hypertension Mother   . Heart attack Father   . Hypertension Brother        2 out of 3 brothers  . Heart attack Brother        2 out of 3 brothers  . Diabetes Brother   . Asthma Sister   . Breast cancer Sister     Social History Social History   Tobacco Use  . Smoking status: Former Smoker     Packs/day: 0.00    Years: 30.00    Pack years: 0.00    Start date: 12/28/1983    Last attempt to quit: 05/26/2014    Years since quitting: 4.7  . Smokeless tobacco: Never Used  . Tobacco comment: quit smoking 3 months ago  Substance Use Topics  . Alcohol use: Yes    Comment: socially  . Drug use: No     Allergies   Imitrex [sumatriptan]; Dilaudid [hydromorphone hcl]; Ace inhibitors; Ondansetron; Tramadol; Codeine; and Latex   Review of Systems Review of Systems  Constitutional: Negative for chills  and fever.  HENT: Negative for facial swelling and sore throat.   Respiratory: Positive for shortness of breath.   Cardiovascular: Positive for chest pain.  Gastrointestinal: Positive for nausea. Negative for abdominal pain and vomiting.  Genitourinary: Negative for dysuria.  Musculoskeletal: Positive for back pain.  Skin: Negative for rash and wound.  Neurological: Negative for headaches.  Psychiatric/Behavioral: The patient is not nervous/anxious.      Physical Exam Updated Vital Signs BP 110/78 (BP Location: Left Arm)   Pulse 79   Temp (!) 97.4 F (36.3 C) (Oral)   Resp 12   Ht 5\' 8"  (1.727 m)   Wt 90.7 kg   LMP 12/28/2012   SpO2 96%   BMI 30.41 kg/m   Physical Exam Vitals signs and nursing note reviewed.  Constitutional:      General: She is not in acute distress.    Appearance: She is well-developed. She is not diaphoretic.  HENT:     Head: Normocephalic and atraumatic.     Mouth/Throat:     Pharynx: No oropharyngeal exudate.  Eyes:     General: No scleral icterus.       Right eye: No discharge.        Left eye: No discharge.     Conjunctiva/sclera: Conjunctivae normal.     Pupils: Pupils are equal, round, and reactive to light.  Neck:     Musculoskeletal: Normal range of motion and neck supple.     Thyroid: No thyromegaly.  Cardiovascular:     Rate and Rhythm: Normal rate and regular rhythm.     Heart sounds: Normal heart sounds. No murmur. No friction  rub. No gallop.   Pulmonary:     Effort: Pulmonary effort is normal. No respiratory distress.     Breath sounds: Normal breath sounds. No stridor. No wheezing or rales.  Abdominal:     General: Bowel sounds are normal. There is no distension.     Palpations: Abdomen is soft.     Tenderness: There is no abdominal tenderness. There is no guarding or rebound.  Lymphadenopathy:     Cervical: No cervical adenopathy.  Skin:    General: Skin is warm and dry.     Coloration: Skin is not pale.     Findings: No rash.  Neurological:     Mental Status: She is alert.     Coordination: Coordination normal.      ED Treatments / Results  Labs (all labs ordered are listed, but only abnormal results are displayed) Labs Reviewed  COMPREHENSIVE METABOLIC PANEL - Abnormal; Notable for the following components:      Result Value   Potassium 3.3 (*)    Glucose, Bld 133 (*)    AST 120 (*)    ALT 59 (*)    All other components within normal limits  CBC WITH DIFFERENTIAL/PLATELET  TROPONIN I  D-DIMER, QUANTITATIVE (NOT AT [facility])  LIPASE, BLOOD  TROPONIN I    EKG EKG Interpretation  Date/Time:  Sunday February 05 2019 09:36:35 EDT Ventricular Rate:  73 PR Interval:    QRS Duration: 105 QT Interval:  419 QTC Calculation: 462 R Axis:   73 Text Interpretation:  Sinus rhythm Abnormal R-wave progression, early transition No significant change since last tracing Confirmed by Blanchie Dessert [phone]) on 02/05/2019 9:44:38 AM   Radiology Dg Chest 2 View  Result Date: 02/05/2019 CLINICAL DATA:  Chest pain. EXAM: CHEST - 2 VIEW COMPARISON:  Radiographs of December 09, 2018. FINDINGS: The heart size  and mediastinal contours are within normal limits. No pneumothorax or pleural effusion is noted. Right lung is clear. Stable calcified granuloma seen in left lower lobe. No acute abnormality is noted. The visualized skeletal structures are unremarkable. IMPRESSION: No active cardiopulmonary disease.  Electronically Signed   By: Marijo Conception, M.D.   On: 02/05/2019 10:55   US Abdomen Limited Ruq  Result Date: 02/05/2019 CLINICAL DATA:  Elevated liver function tests. Epigastric abdominal pain. EXAM: ULTRASOUND ABDOMEN LIMITED RIGHT UPPER QUADRANT COMPARISON:  CT scan of July 28, 2018. FINDINGS: Gallbladder: Cholelithiasis is noted without gallbladder wall thickening or pericholecystic fluid. No sonographic Murphy's sign is noted. Largest calculus measures 1.8 cm. Common bile duct: Diameter: 4 mm which is within normal limits. Liver: No focal lesion identified. Within normal limits in parenchymal echogenicity. Portal vein is patent on color Doppler imaging with normal direction of blood flow towards the liver. IMPRESSION: Cholelithiasis without definite evidence of inflammation. No other abnormality seen in the right upper quadrant of the abdomen. Electronically Signed   By: Marijo Conception, M.D.   On: 02/05/2019 13:13    Procedures Procedures (including critical care time)  Medications Ordered in ED Medications  morphine 4 MG/ML injection 4 mg (4 mg Intravenous Given 02/05/19 1253)  metoCLOPramide (REGLAN) injection 10 mg (10 mg Intravenous Given 02/05/19 1253)     Initial Impression / Assessment and Plan / ED Course  I have reviewed the triage vital signs and the nursing notes.  Pertinent labs & imaging results that were available during my care of the patient were reviewed by me and considered in my medical decision making (see chart for details).        Patient presenting with chest pain, epigastric pain, shortness of breath, nausea after eating a bacon biscuit this morning.  Patient does have several risk factors for ACS, however pain is somewhat atypical.  Elevated liver function tests with AST 120, ALT 59.  Delta troponin is negative.  EKG is unchanged from last and shows NSR.  Chest x-ray is clear.  Right upper quadrant ultrasound shows cholelithiasis with up to 1.8 cm  gallstone.  Patient's pain controlled with morphine in the ED.  Suspect possible passed gallstone/biliary colic.  Heart score is 5 and given this, patient offered admission for chest pain rule out, however she did not want to stay and wanted to go home.  She agreed to a delta troponin which was negative.  Low suspicion of aortic dissection, bilateral blood pressures are within normal limits and symmetric. No hypertension noted in the ED. Patient will be discharged home with short course of Norco and Reglan.  Patient will follow up with PCP and GI for further evaluation of elevated LFTs.  Strict return precautions given including worsening chest pain, shortness of breath, abdominal pain, fever.  She understands and agrees with plan.  Patient vital stable throughout ED course and discharged in satisfactory condition. I discussed patient case with Dr. Maryan Rued who guided the patient's management and agrees with plan.   Final Clinical Impressions(s) / ED Diagnoses   Final diagnoses:  Epigastric abdominal pain  Elevated LFTs  Atypical chest pain    ED Discharge Orders         Ordered    HYDROcodone-acetaminophen (NORCO/VICODIN) 5-325 MG tablet  Every 6 hours PRN     02/05/19 1521    metoCLOPramide (REGLAN) 10 MG tablet  Every 6 hours     02/05/19 1521  Frederica Kuster, PA-C 02/05/19 1527    Blanchie Dessert, MD 02/08/19 [phone]

## 2019-02-05 NOTE — Discharge Instructions (Signed)
Take Norco every 6 hours as needed for severe pain.  Do not drive or operate machinery while taking this medication.  Take Reglan every 6 hours as needed for nausea or vomiting.  Avoid greasy, fatty, fried foods.  Please follow-up with your doctor for recheck either by calling the office or making an appointment.  Please follow-up with GI doctor for evaluation of your elevated liver function tests.  This may have been related to passing of a gallstone today.  Please return the emergency department you develop any new or worsening symptoms including worsening or persistent chest pain, worsening shortness of breath, severe, localized abdominal pain, persistent fever over 100.4, intractable vomiting, or any other new or concerning symptoms.  Do not drink alcohol, drive, operate machinery or participate in any other potentially dangerous activities while taking opiate pain medication as it may make you sleepy. Do not take this medication with any other sedating medications, either prescription or over-the-counter. If you were prescribed Percocet or Vicodin, do not take these with acetaminophen (Tylenol) as it is already contained within these medications and overdose of Tylenol is dangerous.   This medication is an opiate (or narcotic) pain medication and can be habit forming.  Use it as little as possible to achieve adequate pain control.  Do not use or use it with extreme caution if you have a history of opiate abuse or dependence. This medication is intended for your use only - do not give any to anyone else and keep it in a secure place where nobody else, especially children, have access to it. It will also cause or worsen constipation, so you may want to consider taking an over-the-counter stool softener while you are taking this medication.

## 2019-02-05 NOTE — ED Notes (Signed)
Nira Conn, patient's daughter wanted to leave contact info [phone]

## 2019-02-05 NOTE — ED Triage Notes (Signed)
Pt here from home with c/o chest pain that radiated to her back , pt had 2 nitro and 1 324mg  asa

## 2019-02-06 MED FILL — HYDROCODON-APAP 5-325: 5-325 | 2 days supply | Qty: 10 | Fill #0

## 2019-02-06 MED FILL — METOCLOPRAMIDE 10 MG TABLET: 10 | 7 days supply | Qty: 30 | Fill #0

## 2019-02-21 MED FILL — ALPRAZolam 0.5 MG TABS: 0.5 | 30 days supply | Qty: 60 | Fill #2

## 2019-02-21 MED FILL — DULOXETINE HCL 60 MG CPEP: 60 | 30 days supply | Qty: 30 | Fill #0

## 2019-02-22 DIAGNOSIS — M791 Myalgia, unspecified site: Secondary | ICD-10-CM | POA: Diagnosis not present

## 2019-02-27 DIAGNOSIS — G4726 Circadian rhythm sleep disorder, shift work type: Secondary | ICD-10-CM | POA: Diagnosis not present

## 2019-02-27 DIAGNOSIS — Z79899 Other long term (current) drug therapy: Secondary | ICD-10-CM | POA: Diagnosis not present

## 2019-02-27 DIAGNOSIS — F418 Other specified anxiety disorders: Secondary | ICD-10-CM | POA: Diagnosis not present

## 2019-02-27 DIAGNOSIS — R5382 Chronic fatigue, unspecified: Secondary | ICD-10-CM | POA: Diagnosis not present

## 2019-02-28 MED FILL — OMEPRAZOLE 20 MG CPDR: 20 | 90 days supply | Qty: 90 | Fill #1

## 2019-03-01 MED FILL — busPIRone HCL 5 MG TABS: 5 | 30 days supply | Qty: 60 | Fill #0

## 2019-03-13 DIAGNOSIS — F418 Other specified anxiety disorders: Secondary | ICD-10-CM | POA: Diagnosis not present

## 2019-03-13 DIAGNOSIS — G44219 Episodic tension-type headache, not intractable: Secondary | ICD-10-CM | POA: Diagnosis not present

## 2019-03-13 DIAGNOSIS — I82509 Chronic embolism and thrombosis of unspecified deep veins of unspecified lower extremity: Secondary | ICD-10-CM | POA: Diagnosis not present

## 2019-03-24 DIAGNOSIS — M791 Myalgia, unspecified site: Secondary | ICD-10-CM | POA: Diagnosis not present

## 2019-04-07 MED FILL — ALPRAZOLAM 0.5 MG TABS: 0.5 | 30 days supply | Qty: 60 | Fill #3

## 2019-04-07 MED FILL — DULOXETINE HCL 60 MG CPEP: 60 | 30 days supply | Qty: 30 | Fill #1

## 2019-04-11 MED FILL — busPIRone HCL 5 MG TABS: 5 | 90 days supply | Qty: 180 | Fill #0

## 2019-04-24 DIAGNOSIS — M791 Myalgia, unspecified site: Secondary | ICD-10-CM | POA: Diagnosis not present

## 2019-05-15 DIAGNOSIS — F418 Other specified anxiety disorders: Secondary | ICD-10-CM | POA: Diagnosis not present

## 2019-05-15 DIAGNOSIS — I82509 Chronic embolism and thrombosis of unspecified deep veins of unspecified lower extremity: Secondary | ICD-10-CM | POA: Diagnosis not present

## 2019-05-15 DIAGNOSIS — G4726 Circadian rhythm sleep disorder, shift work type: Secondary | ICD-10-CM | POA: Diagnosis not present

## 2019-05-15 DIAGNOSIS — G44219 Episodic tension-type headache, not intractable: Secondary | ICD-10-CM | POA: Diagnosis not present

## 2019-05-15 MED FILL — ALPRAZOLAM 0.5 MG TABS: 0.5 | 30 days supply | Qty: 30 | Fill #0

## 2019-05-15 MED FILL — METOPROLOL SUCCINATE ER 25: 25 | 90 days supply | Qty: 90 | Fill #0

## 2019-05-15 MED FILL — TOPIRAMATE 25 MG TAB: 25 | 30 days supply | Qty: 30 | Fill #0

## 2019-05-24 DIAGNOSIS — M791 Myalgia, unspecified site: Secondary | ICD-10-CM | POA: Diagnosis not present

## 2019-06-12 MED FILL — DULoxetine HCL 30 MG CPEP: 30 | 30 days supply | Qty: 30 | Fill #1

## 2019-06-12 MED FILL — DULOXETINE HCL 60 MG CPEP: 60 | 30 days supply | Qty: 30 | Fill #0

## 2019-06-21 MED FILL — TOPIRAMATE 25 MG TAB: 25 | 30 days supply | Qty: 30 | Fill #1

## 2019-06-24 DIAGNOSIS — M791 Myalgia, unspecified site: Secondary | ICD-10-CM | POA: Diagnosis not present

## 2019-07-10 MED FILL — DULOXETINE HCL 60 MG CPEP: 60 | 30 days supply | Qty: 30 | Fill #1

## 2019-07-10 MED FILL — ALPRAZOLAM 0.5 MG TABS: 0.5 | 30 days supply | Qty: 30 | Fill #0

## 2019-07-10 MED FILL — TOPIRAMATE 25 MG TAB: 25 | 30 days supply | Qty: 30 | Fill #1

## 2019-07-25 DIAGNOSIS — M791 Myalgia, unspecified site: Secondary | ICD-10-CM | POA: Diagnosis not present

## 2019-08-11 MED FILL — TOPIRAMATE 50 MG TABLET: 50 | 30 days supply | Qty: 30 | Fill #0

## 2019-08-11 MED FILL — ALPRAZolam 0.5 MG TABS: 0.5 | 30 days supply | Qty: 30 | Fill #0

## 2019-08-21 MED FILL — METOPROLOL SUCCINATE ER 25: 25 | 90 days supply | Qty: 90 | Fill #1

## 2019-08-21 MED FILL — OMEPRAZOLE 20 MG CAP: 20 | 90 days supply | Qty: 90 | Fill #2

## 2019-08-24 DIAGNOSIS — M791 Myalgia, unspecified site: Secondary | ICD-10-CM | POA: Diagnosis not present

## 2019-09-14 MED FILL — TOPIRAMATE 50 MG TABLET: 50 | 30 days supply | Qty: 30 | Fill #1

## 2019-09-14 MED FILL — busPIRone HCL 5 MG TABS: 5 | 30 days supply | Qty: 60 | Fill #1

## 2019-09-14 MED FILL — ALPRAZolam 0.5 MG TABS: 0.5 | 30 days supply | Qty: 30 | Fill #1

## 2019-09-18 MED FILL — ALBUTEROL SULFATE HFA 108 (: 108 (90 BAS | 17 days supply | Qty: 18 | Fill #0

## 2019-09-18 MED FILL — DOXYCYCLINE HYCLATE 100 MG: 100 | 10 days supply | Qty: 20 | Fill #0

## 2019-09-22 MED FILL — predniSONE 10 MG TABS: 10 | 6 days supply | Qty: 21 | Fill #0

## 2019-10-10 ENCOUNTER — Emergency Department (HOSPITAL_BASED_OUTPATIENT_CLINIC_OR_DEPARTMENT_OTHER): Payer: Managed Care, Other (non HMO)

## 2019-10-10 ENCOUNTER — Other Ambulatory Visit: Payer: Self-pay

## 2019-10-10 ENCOUNTER — Encounter (HOSPITAL_BASED_OUTPATIENT_CLINIC_OR_DEPARTMENT_OTHER): Payer: Self-pay | Admitting: Emergency Medicine

## 2019-10-10 ENCOUNTER — Emergency Department (HOSPITAL_BASED_OUTPATIENT_CLINIC_OR_DEPARTMENT_OTHER)
Admission: EM | Admit: 2019-10-10 | Discharge: 2019-10-11 | Disposition: A | Discharge status: Home / Self Care | Payer: Managed Care, Other (non HMO) | Attending: Emergency Medicine | Admitting: Emergency Medicine

## 2019-10-10 DIAGNOSIS — R110 Nausea: Secondary | ICD-10-CM | POA: Diagnosis not present

## 2019-10-10 DIAGNOSIS — R0602 Shortness of breath: Secondary | ICD-10-CM | POA: Diagnosis not present

## 2019-10-10 DIAGNOSIS — Z79899 Other long term (current) drug therapy: Secondary | ICD-10-CM | POA: Diagnosis not present

## 2019-10-10 DIAGNOSIS — F17210 Nicotine dependence, cigarettes, uncomplicated: Secondary | ICD-10-CM | POA: Diagnosis not present

## 2019-10-10 DIAGNOSIS — R0789 Other chest pain: Secondary | ICD-10-CM

## 2019-10-10 DIAGNOSIS — F419 Anxiety disorder, unspecified: Secondary | ICD-10-CM | POA: Diagnosis not present

## 2019-10-10 DIAGNOSIS — R079 Chest pain, unspecified: Secondary | ICD-10-CM | POA: Diagnosis present

## 2019-10-10 LAB — BASIC METABOLIC PANEL
Anion gap: 9 (ref 5–15)
BUN: 16 mg/dL (ref 6–20)
CO2: 26 mmol/L (ref 22–32)
Calcium: 9.8 mg/dL (ref 8.9–10.3)
Chloride: 103 mmol/L (ref 98–111)
Creatinine, Ser: 0.65 mg/dL (ref 0.44–1.00)
GFR calc Af Amer: >60 mL/min (ref >60)
GFR calc non Af Amer: >60 mL/min (ref >60)
Glucose, Bld: 100 mg/dL — ABNORMAL HIGH (ref 70–99)
Potassium: 3.8 mmol/L (ref 3.5–5.1)
Sodium: 138 mmol/L (ref 135–145)

## 2019-10-10 LAB — CBC
HCT: 41.4 % (ref 36.0–46.0)
Hemoglobin: 13.5 g/dL (ref 12.0–15.0)
MCH: 30.4 pg (ref 26.0–34.0)
MCHC: 32.6 g/dL (ref 30.0–36.0)
MCV: 93.2 fL (ref 80.0–100.0)
Platelets: 244 10*3/uL (ref 150–400)
RBC: 4.44 MIL/uL (ref 3.87–5.11)
RDW: 13.5 % (ref 11.5–15.5)
WBC: 8.7 10*3/uL (ref 4.0–10.5)
nRBC: 0 % (ref 0.0–0.2)

## 2019-10-10 LAB — TROPONIN I (HIGH SENSITIVITY): Troponin I (High Sensitivity): 3 ng/L (ref <18)

## 2019-10-10 LAB — D-DIMER, QUANTITATIVE: D-Dimer, Quant: 2.19 ug/mL-FEU — ABNORMAL HIGH (ref 0.00–0.50)

## 2019-10-10 MED ORDER — LORAZEPAM 1 MG PO TABS
1.0000 mg | ORAL_TABLET | Freq: Once | ORAL | Status: AC
  Administered 2019-10-10: 23:00:00 1 mg via ORAL
  Filled 2019-10-10: qty 1

## 2019-10-10 MED ORDER — METOCLOPRAMIDE HCL 10 MG PO TABS: 10.0000 mg | ORAL_TABLET | Freq: Once | ORAL | Status: DC

## 2019-10-10 MED ORDER — IOHEXOL 350 MG/ML SOLN
100.0000 mL | Freq: Once | INTRAVENOUS | Status: AC | PRN
  Administered 2019-10-10: 100 mL via INTRAVENOUS

## 2019-10-10 MED ORDER — ASPIRIN 81 MG PO CHEW: 162.0000 mg | CHEWABLE_TABLET | Freq: Once | ORAL | Status: DC

## 2019-10-10 NOTE — Discharge Instructions (Addendum)
Your EKG, lab work, chest x-ray and CT scan were negative for signs of heart attack or pulmonary embolism.  You have had 2 normal sets of cardiac enzymes.  Follow-up with your primary care doctor for reassessment.  If you have any new or concerning symptoms please return to the ER.   In the meantime I have included some information for outpatient therapy/mental health.  You may find useful to talk to a behavioral health professional since you have described increased stress in your life.  This may be causing some of your symptoms.

## 2019-10-10 NOTE — ED Provider Notes (Signed)
[facility] EMERGENCY DEPARTMENT Provider Note   CSN: [id number] Arrival date & time: 10/10/19  2125     History   Chief Complaint Chief Complaint  Patient presents with   Shortness of Breath   Chest Pain    HPI Melissa Morgan is a 53 y.o. female.  HPI: A 53 year old patient presents for evaluation of chest pain. Initial onset of pain was approximately 3-6 hours ago. The patient's chest pain is described as heaviness/pressure/tightness and is not worse with exertion. The patient complains of nausea. The patient's chest pain is not middle- or left-sided, is not well-localized, is not sharp and does not radiate to the arms/jaw/neck. The patient denies diaphoresis. The patient has smoked in the past 90 days. The patient has no history of stroke, has no history of peripheral artery disease, denies any history of treated diabetes, has no relevant family history of coronary artery disease (first degree relative at less than age 80), is not hypertensive, has no history of hypercholesterolemia and does not have an elevated BMI (>=30).   HPI  Patient presents today for intermittent lightheadedness, nausea for the past 2 days with some increased shortness of breath and sensation of fullness in her chest with some heaviness.  Patient denies any cough, congestion, fevers associated with this.  States that she has been under a lot of stress at work recently and feels that she may be having anxiety attack however she has a history of a DVT after surgery 5 years ago and wanted to make sure she was not having a pulmonary embolism or heart attack.  Patient denies any chest pain and states that shortness of breath is not exertional.   Patient states she has a history of tachycardia.  States that this feels more similar to anxiety so she said the past however she wanted to be sure as she has multiple relatives who have had heart attacks.  Denies any first-degree relatives of heart attacks  under the age of 6, denies personal history of diabetes, CAD, stroke, obesity.  Endorses half pack per day smoking history.  Denies any nausea currently.  States that she just feels short of breath.  Also endorses current headache but states that it feels similar to past episodes of headache and has been gradual and intermittent for past two days.   Past Medical History:  Diagnosis Date   Anxiety    Arthritis    Brain aneurysm    Depression    DVT (deep venous thrombosis) (HCC)    Endometriosis    GERD (gastroesophageal reflux disease)    occ   Headache    Lacunar infarction (HCC)    PONV (postoperative nausea and vomiting)    extremely sick   Seizures (HCC)    tx 5 yrs in middle school  continuous   Stroke [facility])    cva  14   Tachycardia     Patient Active Problem List   Diagnosis Date Noted   Fatigue 08/06/2016   Loose stools 03/02/2016   Diffuse abdominal pain 03/02/2016   Chronic daily headache 04/14/2015   Aneurysm, cerebral, nonruptured 02/12/2015   Worsening headaches 02/12/2015   Breast pain, right 12/19/2014   Polyarthralgia 12/19/2014   Chest pain 10/01/2014   Nausea without vomiting 10/01/2014   GERD (gastroesophageal reflux disease) 07/30/2014   History of DVT (deep vein thrombosis) 07/23/2014   Cerebral aneurysm without rupture 07/06/2014   Depression with anxiety 03/23/2014    Past Surgical History:  Procedure Laterality  Date   BREAST BIOPSY Right    CRANIOTOMY Right 07/06/2014   Procedure: CRANIOTOMY INTRACRANIAL ANEURYSM FOR CAROTID;  Surgeon: Ashok Pall, MD;  Location: [facility];  Service: Neurosurgery;  Laterality: Right;  right    lasar laparoscopy     for endometriosis x3     OB History   No obstetric history on file.      Home Medications    Prior to Admission medications   Medication Sig Start Date End Date Taking? Authorizing Provider  acetaminophen (TYLENOL) 500 MG tablet Take 1,000 mg by mouth  every 6 (six) hours as needed for mild pain or moderate pain.     [provider]  albuterol (PROVENTIL HFA;VENTOLIN HFA) 108 (90 Base) MCG/ACT inhaler Inhale 1-2 puffs into the lungs every 6 (six) hours as needed for wheezing or shortness of breath.    [provider]  ALPRAZolam [name]) 0.5 MG tablet Take 1 tablet (0.5 mg total) by mouth 2 (two) times daily as needed. for anxiety 10/13/18   Midge Minium, MD  Ascorbic Acid (VITAMIN C) 1000 MG tablet Take 1,000 mg by mouth 2 (two) times daily.    [provider]  B Complex Vitamins (VITAMIN B-COMPLEX) TABS Take 1 tablet by mouth daily.    [provider]  buPROPion (WELLBUTRIN XL) 150 MG 24 hr tablet Take 1 tablet (150 mg total) by mouth daily. Patient not taking: Reported on 02/05/2019 04/13/18   Midge Minium, MD  cetirizine (ZYRTEC) 10 MG tablet Take 10 mg by mouth daily.    [provider]  Cholecalciferol 2000 units CAPS Take 1 capsule (2,000 Units total) by mouth daily. 08/07/16   Midge Minium, MD  DULoxetine (CYMBALTA) 30 MG capsule Take 30 mg by mouth daily. 01/11/19   [provider]  DULoxetine (CYMBALTA) 60 MG capsule TAKE 1 CAPSULE BY MOUTH ONCE DAILY Patient taking differently: Take 30 mg by mouth daily. TAKE 1 CAPSULE BY MOUTH ONCE DAILY 11/28/18   Brunetta Jeans, PA-C  HYDROcodone-acetaminophen (NORCO/VICODIN) 5-325 MG tablet Take 1 tablet by mouth every 6 (six) hours as needed for severe pain. 02/05/19   Law, Bea Graff, PA-C  magnesium oxide (MAG-OX) 400 MG tablet Take 400 mg by mouth daily.    [provider]  metoCLOPramide (REGLAN) 10 MG tablet Take 1 tablet (10 mg total) by mouth every 6 (six) hours. 02/05/19   Law, Bea Graff, PA-C  metoprolol succinate (TOPROL-XL) 25 MG 24 hr tablet Take 1 tablet (25 mg total) by mouth daily. 04/13/18   Midge Minium, MD  omeprazole (PRILOSEC) 20 MG capsule Take 20 mg by mouth daily. 10/20/18   [provider]  promethazine (PHENERGAN) 25 MG tablet Take 25 mg by mouth every 6 (six) hours as needed for nausea/vomiting. 08/30/18   [provider]  Rivaroxaban (XARELTO) 15 MG TABS tablet Take 1/2 pill a day. Patient not taking: Reported on 02/05/2019 05/11/18   Volanda Napoleon, MD  Vitamin D, Ergocalciferol, (DRISDOL) 50000 units CAPS capsule Take 1 capsule (50,000 Units total) by mouth every 7 (seven) days. Patient not taking: Reported on 02/05/2019 04/14/18   Midge Minium, MD    Family History Family History  Problem Relation Age of Onset   Hypertension Mother    Heart attack Father    Hypertension Brother        2 out of 3 brothers   Heart attack Brother        2  out of 3 brothers   Diabetes Brother    Asthma Sister    Breast cancer Sister     Social History Social History   Tobacco Use   Smoking status: Current Some Day Smoker    Packs/day: 0.00    Years: 30.00    Pack years: 0.00    Start date: 12/28/1983    Last attempt to quit: 05/26/2014    Years since quitting: 5.3   Smokeless tobacco: Never Used   Tobacco comment: quit smoking 3 months ago  Substance Use Topics   Alcohol use: Yes    Comment: socially   Drug use: No     Allergies   Imitrex [sumatriptan], Dilaudid [hydromorphone hcl], Ace inhibitors, Ondansetron, Tramadol, Codeine, and Latex   Review of Systems Review of Systems  Constitutional: Negative for chills and fever.  Respiratory: Positive for shortness of breath. Negative for cough, chest tightness and wheezing.   Cardiovascular: Positive for palpitations. Negative for chest pain and leg swelling.       Chest heaviness/fullness   Gastrointestinal: Negative for abdominal pain.  Musculoskeletal: Negative for back pain.  Neurological: Positive for light-headedness and headaches.     Physical Exam Updated Vital Signs BP (!) 149/90    Pulse 76    Temp 97.7 F (36.5 C) (Oral)    Resp 15    Ht 5\' 8"  (1.727 m)    Wt 88  kg    LMP 12/28/2012    SpO2 99%    BMI 29.50 kg/m   Physical Exam Vitals signs and nursing note reviewed.  Constitutional:      General: She is not in acute distress. HENT:     Head: Normocephalic and atraumatic.     Nose: Nose normal.     Mouth/Throat:     Mouth: Mucous membranes are moist.  Eyes:     General: No scleral icterus.    Pupils: Pupils are equal, round, and reactive to light.  Neck:     Musculoskeletal: Normal range of motion.  Cardiovascular:     Rate and Rhythm: Normal rate and regular rhythm.     Pulses: Normal pulses.     Heart sounds: Normal heart sounds.  Pulmonary:     Effort: Pulmonary effort is normal. No respiratory distress.     Breath sounds: Normal breath sounds. No wheezing.     Comments: Breath sounds normal, no increased respiratory rate, no increased work of breathing, 100% on room air. Abdominal:     General: Abdomen is flat.     Palpations: Abdomen is soft.     Tenderness: There is no abdominal tenderness.  Musculoskeletal:     Right lower leg: No edema.     Left lower leg: No edema.     Comments: No anterior chest wall tenderness to palpation  No calf tenderness.  No swelling lower extremities.  Skin:    General: Skin is warm and dry.     Capillary Refill: Capillary refill takes less than 2 seconds.  Neurological:     Mental Status: She is alert. Mental status is at baseline.     Comments: Sensation intact bilateral upper and lower extremities.  Strength grossly 5/5 moves all 4 extremities.  Psychiatric:        Mood and Affect: Mood normal.        Behavior: Behavior normal.      ED Treatments / Results  Labs (all labs ordered are listed, but only abnormal results are displayed) Labs Reviewed  BASIC METABOLIC PANEL - Abnormal; Notable for the following components:      Result Value   Glucose, Bld 100 (*)    All other components within normal limits  D-DIMER, QUANTITATIVE (NOT AT [facility]) - Abnormal; Notable for the following  components:   D-Dimer, Quant 2.19 (*)    All other components within normal limits  CBC  TROPONIN I (HIGH SENSITIVITY)  TROPONIN I (HIGH SENSITIVITY)    EKG EKG Interpretation  Date/Time:  Tuesday October 10 2019 21:31:27 EST Ventricular Rate:  79 PR Interval:  148 QRS Duration: 88 QT Interval:  372 QTC Calculation: 426 R Axis:   42 Text Interpretation: Normal sinus rhythm Cannot rule out Anterior infarct , age undetermined Abnormal ECG When compared to prior, new q wave in lead 3. No STEMI Confirmed by Antony Blackbird [id number]) on 10/10/2019 9:36:12 PM   Radiology Dg Chest 2 View  Result Date: 10/10/2019 CLINICAL DATA:  Chest tightness, shortness of breath EXAM: CHEST - 2 VIEW COMPARISON:  02/05/2019 FINDINGS: Stable calcified granuloma in the left mid lung. Lungs clear. Heart is normal size. No effusions or acute bony abnormality. IMPRESSION: No active cardiopulmonary disease. Electronically Signed   By: Rolm Baptise M.D.   On: 10/10/2019 21:56   Ct Angio Chest Pe W/cm &/or Wo Cm  Result Date: 10/10/2019 CLINICAL DATA:  Dizziness and nausea EXAM: CT ANGIOGRAPHY CHEST WITH CONTRAST TECHNIQUE: Multidetector CT imaging of the chest was performed using the standard protocol during bolus administration of intravenous contrast. Multiplanar CT image reconstructions and MIPs were obtained to evaluate the vascular anatomy. CONTRAST:  175mL OMNIPAQUE IOHEXOL 350 MG/ML SOLN COMPARISON:  June 18, 2015 FINDINGS: Cardiovascular: There is a optimal opacification of the pulmonary arteries. There is no central,segmental, or subsegmental filling defects within the pulmonary arteries. The heart is normal in size. No pericardial effusion or thickening. No evidence right heart strain. There is normal three-vessel brachiocephalic anatomy without proximal stenosis. The thoracic aorta is normal in appearance. Mediastinum/Nodes: No hilar, mediastinal, or axillary adenopathy. Thyroid gland, trachea, and esophagus  demonstrate no significant findings. Lungs/Pleura: There is calcified and noncalcified pulmonary subcentimeter nodule seen within the posterior left lower lobe as on the prior exam, likely from prior granulomatous disease. There is also a tiny 3 mm pulmonary nodule seen in the left lower lobe. Minimal amount of bibasilar dependent atelectasis is noted. No pleural effusion or pneumothorax. No airspace consolidation. Upper Abdomen: No acute abnormalities present in the visualized portions of the upper abdomen. Musculoskeletal: No chest wall abnormality. No acute or significant osseous findings. Review of the MIP images confirms the above findings. IMPRESSION: No central, segmental, or subsegmental pulmonary embolism. No acute intrathoracic pathology to explain the patient's symptoms. Electronically Signed   By: Prudencio Pair M.D.   On: 10/10/2019 23:38    Procedures Procedures (including critical care time)  Medications Ordered in ED Medications  LORazepam (ATIVAN) tablet 1 mg (1 mg Oral Given 10/10/19 2258)  iohexol (OMNIPAQUE) 350 MG/ML injection 100 mL (100 mLs Intravenous Contrast Given 10/10/19 2319)     Initial Impression / Assessment and Plan / ED Course  I have reviewed the triage vital signs and the nursing notes.  Pertinent labs & imaging results that were available during my care of the patient were reviewed by me and considered in my medical decision making (see chart for details).     HEAR Score: 4  Patient presents for shortness of breath and chest fullness. Patient has reassuring vital signs and physical  exam is reassuring however patient has history of DVT in the past and is on anticoagulation currently.  D-dimer ordered due to low risk Wells criteria.  This was elevated at 2.19.  No leg swelling currently, doubt DVT.  Will conduct CT PE study to rule out pulmonary embolism.  This was negative.  Patient states she feels much better than she did at presentation.  I suspect that there  is a psychological component to this as she has only received lorazepam and reassurance.  Blood work is without abnormality.  EKG without evidence of acute ischemia however there are Q waves in lead III that were not there previously.  Will trend troponins and discharge if normal.   At 1231 reassess patient she is well-appearing and would like to go home.  I feel that this is reasonable pending second troponin.  Patient does have elevated heart score of four.  For this reason I discussed admitting patient for observation however she would prefer to be discharged home states she will follow-up with her primary care doctor.  Care transferred to Dr. Leonides Schanz at 12:32 AM.  Pending second troponin.  Assuming this is within normal limits patient would like to be discharged home.  Disposition up to Dr. Leonides Schanz.   Final Clinical Impressions(s) / ED Diagnoses   Final diagnoses:  Shortness of breath  Nausea    ED Discharge Orders    None       Tedd Sias, [place] 10/11/19 0033    Tegeler, Gwenyth Allegra, MD 10/11/19 [phone]

## 2019-10-10 NOTE — ED Triage Notes (Addendum)
Pt states she has been nauseated since last night off and on  Some dizziness this morning and again at work  Pt has felt short of breath tonight and has had some chest tightness  Pt states she has been having heart palpitations for the past hour  Pt states she has had 2 covid tests in the past 14 days, both were negative  Pt also states she has recently had pneumonia

## 2019-10-11 LAB — TROPONIN I (HIGH SENSITIVITY): Troponin I (High Sensitivity): 2 ng/L (ref <18)

## 2019-10-11 NOTE — ED Provider Notes (Signed)
12:43 AM  Assumed care from [facility], [place].  Patient here with SOB, lightheadedness, palpitations, chest "fullness".  Felt similar to anxiety.  H/o DVT no longer on anticoagulation.  First troponin negative.  EKG unchanged other than isolated T ave inversion in III.  HEART score is 4 but patient wants discharge and has outpatient follow up.  Second troponin pending.  D dimer elevated but CTA negative.  Feels better after Ativan.  Daughter to drive home.  1:04 AM  Pt's second troponin negative.  Will discharge home with outpatient follow-up.   I reviewed all nursing notes and pertinent previous records as available.  I have interpreted any EKGs, lab and urine results, imaging (as available).    Ward, Delice Bison, DO 10/11/19 [phone]

## 2019-10-31 MED FILL — ALPRAZolam 0.5 MG TABS: 0.5 | 30 days supply | Qty: 30 | Fill #0

## 2019-11-06 MED FILL — METHYLPREDNISOLONE 4 MG TBP: 4 | 6 days supply | Qty: 21 | Fill #0

## 2019-11-06 MED FILL — PROMETHAZINE 25 MG TABLET: 25 | 7 days supply | Qty: 30 | Fill #0

## 2019-11-28 MED FILL — METOPROLOL SUCCINATE ER 25: 25 | 30 days supply | Qty: 30 | Fill #0

## 2019-11-29 MED FILL — ALPRAZolam 0.5 MG TABS: 0.5 | 30 days supply | Qty: 30 | Fill #0

## 2019-12-01 MED FILL — UBRELVY 50 MG TABS: 50 | 30 days supply | Qty: 10 | Fill #0

## 2019-12-07 ENCOUNTER — Other Ambulatory Visit (HOSPITAL_BASED_OUTPATIENT_CLINIC_OR_DEPARTMENT_OTHER): Payer: Self-pay | Admitting: Physician Assistant

## 2019-12-07 MED FILL — TOPIRAMATE 50 MG TABLET: 50 | 30 days supply | Qty: 60 | Fill #0

## 2019-12-07 MED FILL — BUSPIRONE HCL 7.5 MG TABS: 7.5 | 30 days supply | Qty: 60 | Fill #0

## 2019-12-20 MED FILL — ALPRAZolam 0.5 MG TABS: 0.5 | 23 days supply | Qty: 45 | Fill #0

## 2019-12-29 MED FILL — METOPROLOL SUCCINATE ER 25: 25 | 30 days supply | Qty: 30 | Fill #1

## 2020-01-03 MED FILL — UBRELVY 50 MG TABS: 50 | 30 days supply | Qty: 10 | Fill #0

## 2020-01-12 MED FILL — GABAPENTIN 300 MG CAPSULE: 300 | 30 days supply | Qty: 50 | Fill #0

## 2020-01-22 MED FILL — ALPRAZolam 0.5 MG TABS: 0.5 | 23 days supply | Qty: 45 | Fill #1

## 2020-01-24 MED FILL — BUSPIRONE HCL 7.5 MG TABS: 7.5 | 30 days supply | Qty: 60 | Fill #1

## 2020-02-02 ENCOUNTER — Other Ambulatory Visit: Payer: Self-pay | Admitting: Physician Assistant

## 2020-02-02 DIAGNOSIS — Z1231 Encounter for screening mammogram for malignant neoplasm of breast: Secondary | ICD-10-CM

## 2020-02-02 MED FILL — UBRELVY 50 MG TABS: 50 | 30 days supply | Qty: 10 | Fill #1

## 2020-02-03 MED FILL — METOPROLOL SUCCINATE ER 25: 25 | 30 days supply | Qty: 30 | Fill #0

## 2020-02-15 MED FILL — UBRELVY 50 MG TABS: 50 | 30 days supply | Qty: 10 | Fill #1

## 2020-02-16 ENCOUNTER — Ambulatory Visit: Payer: Managed Care, Other (non HMO)

## 2020-02-19 MED FILL — BUSPIRONE HCL 7.5 MG TABS: 7.5 | 30 days supply | Qty: 60 | Fill #2

## 2020-02-19 MED FILL — GABAPENTIN 300 MG CAPSULE: 300 | 30 days supply | Qty: 50 | Fill #1

## 2020-02-28 MED FILL — ALPRAZolam 0.5 MG TABS: 0.5 | 23 days supply | Qty: 45 | Fill #0

## 2020-02-29 ENCOUNTER — Other Ambulatory Visit: Payer: Self-pay

## 2020-02-29 ENCOUNTER — Other Ambulatory Visit: Payer: Self-pay | Admitting: *Deleted

## 2020-02-29 ENCOUNTER — Telehealth: Payer: Self-pay | Admitting: *Deleted

## 2020-02-29 ENCOUNTER — Ambulatory Visit (HOSPITAL_BASED_OUTPATIENT_CLINIC_OR_DEPARTMENT_OTHER)
Admission: RE | Admit: 2020-02-29 | Discharge: 2020-02-29 | Disposition: A | Discharge status: Home / Self Care | Payer: Managed Care, Other (non HMO) | Source: Ambulatory Visit | Attending: Family | Admitting: Family

## 2020-02-29 ENCOUNTER — Other Ambulatory Visit: Payer: Self-pay | Admitting: Family

## 2020-02-29 DIAGNOSIS — Z86718 Personal history of other venous thrombosis and embolism: Secondary | ICD-10-CM | POA: Diagnosis present

## 2020-02-29 NOTE — Telephone Encounter (Signed)
Call received from patient stating that she has a new "shooting pain to right calf" and would like to know if she can have an US done.  Dr. Marin Olp notified and order received for pt to have [place] to right leg.  Pt notified and order placed per S. [place] NP.

## 2020-03-08 ENCOUNTER — Other Ambulatory Visit (HOSPITAL_BASED_OUTPATIENT_CLINIC_OR_DEPARTMENT_OTHER): Payer: Self-pay | Admitting: Physician Assistant

## 2020-05-22 ENCOUNTER — Other Ambulatory Visit (HOSPITAL_BASED_OUTPATIENT_CLINIC_OR_DEPARTMENT_OTHER): Payer: Self-pay | Admitting: Physician Assistant

## 2020-05-22 MED FILL — DULoxetine HCL 30 MG CPEP: 30 | 30 days supply | Qty: 30 | Fill #0

## 2020-07-04 MED FILL — DULoxetine HCL 30 MG CPEP: 30 | 30 days supply | Qty: 30 | Fill #1

## 2020-07-04 MED FILL — BUSPIRONE HCL 7.5 MG TABS: 7.5 | 30 days supply | Qty: 60 | Fill #0

## 2020-07-05 MED FILL — METOPROLOL SUCCINATE ER 25: 25 | 90 days supply | Qty: 90 | Fill #0

## 2020-07-05 MED FILL — TOPIRAMATE 50 MG TABLET: 50 | 30 days supply | Qty: 60 | Fill #0

## 2020-07-11 MED FILL — ALPRAZolam 0.5 MG TABS: 0.5 | 22 days supply | Qty: 45 | Fill #0

## 2020-08-09 MED FILL — DULoxetine HCL 30 MG CPEP: 30 | 30 days supply | Qty: 30 | Fill #2

## 2020-08-22 MED FILL — BUSPIRONE HCL 7.5 MG TABS: 7.5 | 30 days supply | Qty: 60 | Fill #1

## 2020-08-26 MED FILL — ALPRAZolam 0.5 MG TABS: 0.5 | 22 days supply | Qty: 45 | Fill #1

## 2020-08-30 ENCOUNTER — Other Ambulatory Visit: Payer: Self-pay

## 2020-08-30 ENCOUNTER — Ambulatory Visit
Admission: RE | Admit: 2020-08-30 | Discharge: 2020-08-30 | Disposition: A | Discharge status: Home / Self Care | Payer: 59 | Source: Ambulatory Visit | Attending: Physician Assistant | Admitting: Physician Assistant

## 2020-08-30 DIAGNOSIS — Z1231 Encounter for screening mammogram for malignant neoplasm of breast: Secondary | ICD-10-CM | POA: Diagnosis not present

## 2020-09-12 DIAGNOSIS — Z0184 Encounter for antibody response examination: Secondary | ICD-10-CM | POA: Diagnosis not present

## 2020-09-12 DIAGNOSIS — Z20822 Contact with and (suspected) exposure to covid-19: Secondary | ICD-10-CM | POA: Diagnosis not present

## 2020-09-18 MED FILL — DULoxetine HCL 30 MG CPEP: 30 | 30 days supply | Qty: 30 | Fill #3

## 2020-10-02 MED FILL — BUSPIRONE HCL 7.5 MG TABS: 7.5 | 30 days supply | Qty: 60 | Fill #2

## 2020-10-17 ENCOUNTER — Other Ambulatory Visit (HOSPITAL_BASED_OUTPATIENT_CLINIC_OR_DEPARTMENT_OTHER): Payer: Self-pay | Admitting: Physician Assistant

## 2020-10-17 MED FILL — ALPRAZolam 0.5 MG TABS: 0.5 | 30 days supply | Qty: 45 | Fill #0

## 2020-10-30 MED FILL — DULoxetine HCL 30 MG CPEP: 30 | 30 days supply | Qty: 30 | Fill #4

## 2020-11-14 MED FILL — BUSPIRONE HCL 7.5 MG TABS: 7.5 | 30 days supply | Qty: 60 | Fill #3

## 2020-11-21 ENCOUNTER — Other Ambulatory Visit (HOSPITAL_BASED_OUTPATIENT_CLINIC_OR_DEPARTMENT_OTHER): Payer: Self-pay | Admitting: Physician Assistant

## 2020-11-21 MED FILL — PROMETHAZINE 12.5 MG TABLET: 12.5 | 7 days supply | Qty: 30 | Fill #0

## 2020-11-29 MED FILL — ALPRAZolam 0.5 MG TABS: 0.5 | 30 days supply | Qty: 45 | Fill #1

## 2020-12-05 MED FILL — DULoxetine HCL 30 MG CPEP: 30 | 30 days supply | Qty: 30 | Fill #5

## 2020-12-06 ENCOUNTER — Other Ambulatory Visit (HOSPITAL_BASED_OUTPATIENT_CLINIC_OR_DEPARTMENT_OTHER): Payer: Self-pay | Admitting: Physician Assistant

## 2020-12-06 DIAGNOSIS — U071 COVID-19: Secondary | ICD-10-CM | POA: Diagnosis not present

## 2020-12-06 MED FILL — ALBUTEROL SULFATE HFA 108 (: 108 (90 BAS | 25 days supply | Qty: 18 | Fill #0

## 2020-12-20 MED FILL — METOPROLOL SUCCINATE ER 25: 25 | 90 days supply | Qty: 90 | Fill #1

## 2021-01-08 ENCOUNTER — Other Ambulatory Visit (HOSPITAL_BASED_OUTPATIENT_CLINIC_OR_DEPARTMENT_OTHER): Payer: Self-pay | Admitting: Physician Assistant

## 2021-01-08 MED FILL — busPIRone HCL 7.5 MG TABS: 7.5 | 30 days supply | Qty: 60 | Fill #0

## 2021-01-08 MED FILL — DULoxetine HCL 30 MG CPEP: 30 | 30 days supply | Qty: 30 | Fill #0

## 2021-01-09 ENCOUNTER — Other Ambulatory Visit (HOSPITAL_BASED_OUTPATIENT_CLINIC_OR_DEPARTMENT_OTHER): Payer: Self-pay | Admitting: Physician Assistant

## 2021-01-09 MED FILL — ALPRAZOLAM 0.5 MG TABS: 0.5 | 30 days supply | Qty: 45 | Fill #0

## 2021-01-10 ENCOUNTER — Other Ambulatory Visit: Payer: Self-pay

## 2021-01-10 ENCOUNTER — Ambulatory Visit: Payer: Self-pay

## 2021-01-10 ENCOUNTER — Other Ambulatory Visit: Payer: Self-pay | Admitting: Occupational Medicine

## 2021-01-10 DIAGNOSIS — S20211A Contusion of right front wall of thorax, initial encounter: Secondary | ICD-10-CM

## 2021-02-12 ENCOUNTER — Other Ambulatory Visit (HOSPITAL_BASED_OUTPATIENT_CLINIC_OR_DEPARTMENT_OTHER): Payer: Self-pay

## 2021-02-13 ENCOUNTER — Other Ambulatory Visit (HOSPITAL_BASED_OUTPATIENT_CLINIC_OR_DEPARTMENT_OTHER): Payer: Self-pay

## 2021-02-13 MED ORDER — DULOXETINE HCL 30 MG PO CPEP
ORAL_CAPSULE | ORAL | 0 refills | Status: DC
  Filled 2021-02-13: qty 30, 30d supply, fill #0

## 2021-02-13 MED ORDER — BUSPIRONE HCL 7.5 MG PO TABS
ORAL_TABLET | ORAL | 0 refills | Status: DC
  Filled 2021-02-13: qty 60, 30d supply, fill #0

## 2021-02-14 ENCOUNTER — Other Ambulatory Visit (HOSPITAL_BASED_OUTPATIENT_CLINIC_OR_DEPARTMENT_OTHER): Payer: Self-pay

## 2021-02-14 MED ORDER — BUSPIRONE HCL 7.5 MG PO TABS
ORAL_TABLET | ORAL | 0 refills | Status: DC
  Filled 2021-02-14: qty 60, 30d supply, fill #0

## 2021-02-14 MED ORDER — DULOXETINE HCL 30 MG PO CPEP
ORAL_CAPSULE | ORAL | 0 refills | Status: DC
  Filled 2021-02-14: qty 30, 30d supply, fill #0

## 2021-02-20 ENCOUNTER — Other Ambulatory Visit (HOSPITAL_BASED_OUTPATIENT_CLINIC_OR_DEPARTMENT_OTHER): Payer: Self-pay

## 2021-02-20 MED FILL — Alprazolam Tab 0.5 MG: ORAL | 23 days supply | Qty: 45 | Fill #0 | Status: AC

## 2021-02-21 ENCOUNTER — Other Ambulatory Visit (HOSPITAL_BASED_OUTPATIENT_CLINIC_OR_DEPARTMENT_OTHER): Payer: Self-pay

## 2021-02-21 DIAGNOSIS — E559 Vitamin D deficiency, unspecified: Secondary | ICD-10-CM | POA: Diagnosis not present

## 2021-02-21 DIAGNOSIS — F418 Other specified anxiety disorders: Secondary | ICD-10-CM | POA: Diagnosis not present

## 2021-02-21 DIAGNOSIS — R7303 Prediabetes: Secondary | ICD-10-CM | POA: Diagnosis not present

## 2021-02-21 DIAGNOSIS — N289 Disorder of kidney and ureter, unspecified: Secondary | ICD-10-CM | POA: Diagnosis not present

## 2021-02-21 MED ORDER — DULOXETINE HCL 60 MG PO CPEP
60.0000 mg | ORAL_CAPSULE | Freq: Every day | ORAL | 3 refills | Status: DC
  Filled 2021-02-21 – 2021-03-07 (×2): qty 90, 90d supply, fill #0

## 2021-02-27 ENCOUNTER — Other Ambulatory Visit (HOSPITAL_BASED_OUTPATIENT_CLINIC_OR_DEPARTMENT_OTHER): Payer: Self-pay

## 2021-02-28 ENCOUNTER — Other Ambulatory Visit (HOSPITAL_BASED_OUTPATIENT_CLINIC_OR_DEPARTMENT_OTHER): Payer: Self-pay

## 2021-03-06 DIAGNOSIS — H5203 Hypermetropia, bilateral: Secondary | ICD-10-CM | POA: Diagnosis not present

## 2021-03-07 ENCOUNTER — Other Ambulatory Visit (HOSPITAL_BASED_OUTPATIENT_CLINIC_OR_DEPARTMENT_OTHER): Payer: Self-pay

## 2021-03-20 ENCOUNTER — Other Ambulatory Visit (HOSPITAL_BASED_OUTPATIENT_CLINIC_OR_DEPARTMENT_OTHER): Payer: Self-pay

## 2021-03-20 MED ORDER — BUSPIRONE HCL 7.5 MG PO TABS
ORAL_TABLET | ORAL | 0 refills | Status: DC
  Filled 2021-03-20: qty 180, 90d supply, fill #0

## 2021-03-20 MED ORDER — METOPROLOL SUCCINATE ER 25 MG PO TB24
1.0000 | ORAL_TABLET | Freq: Every day | ORAL | 0 refills | Status: DC
  Filled 2021-03-20: qty 90, 90d supply, fill #0

## 2021-03-28 ENCOUNTER — Other Ambulatory Visit (HOSPITAL_BASED_OUTPATIENT_CLINIC_OR_DEPARTMENT_OTHER): Payer: Self-pay

## 2021-03-28 MED ORDER — ALPRAZOLAM 0.5 MG PO TABS
ORAL_TABLET | ORAL | 1 refills | Status: DC
  Filled 2021-03-28: qty 45, 22d supply, fill #0

## 2021-04-09 ENCOUNTER — Ambulatory Visit: Payer: 59

## 2021-04-18 ENCOUNTER — Other Ambulatory Visit (HOSPITAL_BASED_OUTPATIENT_CLINIC_OR_DEPARTMENT_OTHER): Payer: Self-pay

## 2021-04-18 DIAGNOSIS — R1011 Right upper quadrant pain: Secondary | ICD-10-CM | POA: Diagnosis not present

## 2021-04-18 DIAGNOSIS — K8020 Calculus of gallbladder without cholecystitis without obstruction: Secondary | ICD-10-CM | POA: Diagnosis not present

## 2021-04-18 MED ORDER — OMEPRAZOLE 20 MG PO CPDR
DELAYED_RELEASE_CAPSULE | ORAL | 0 refills | Status: DC
  Filled 2021-04-18: qty 30, 30d supply, fill #0

## 2021-04-18 MED ORDER — PROMETHAZINE HCL 25 MG PO TABS
ORAL_TABLET | ORAL | 0 refills | Status: DC
  Filled 2021-04-18: qty 20, 5d supply, fill #0

## 2021-04-25 ENCOUNTER — Other Ambulatory Visit (HOSPITAL_BASED_OUTPATIENT_CLINIC_OR_DEPARTMENT_OTHER): Payer: Self-pay

## 2021-04-29 ENCOUNTER — Encounter: Payer: Self-pay | Admitting: Podiatry

## 2021-04-29 ENCOUNTER — Other Ambulatory Visit: Payer: Self-pay

## 2021-04-29 ENCOUNTER — Ambulatory Visit (INDEPENDENT_AMBULATORY_CARE_PROVIDER_SITE_OTHER): Payer: 59

## 2021-04-29 ENCOUNTER — Other Ambulatory Visit (HOSPITAL_BASED_OUTPATIENT_CLINIC_OR_DEPARTMENT_OTHER): Payer: Self-pay

## 2021-04-29 ENCOUNTER — Ambulatory Visit (INDEPENDENT_AMBULATORY_CARE_PROVIDER_SITE_OTHER): Payer: 59 | Admitting: Podiatry

## 2021-04-29 DIAGNOSIS — M21611 Bunion of right foot: Secondary | ICD-10-CM

## 2021-04-29 DIAGNOSIS — M19071 Primary osteoarthritis, right ankle and foot: Secondary | ICD-10-CM

## 2021-04-29 DIAGNOSIS — M21619 Bunion of unspecified foot: Secondary | ICD-10-CM

## 2021-04-29 DIAGNOSIS — M21612 Bunion of left foot: Secondary | ICD-10-CM

## 2021-04-29 DIAGNOSIS — B351 Tinea unguium: Secondary | ICD-10-CM | POA: Diagnosis not present

## 2021-04-29 DIAGNOSIS — M205X9 Other deformities of toe(s) (acquired), unspecified foot: Secondary | ICD-10-CM

## 2021-04-29 DIAGNOSIS — M19072 Primary osteoarthritis, left ankle and foot: Secondary | ICD-10-CM

## 2021-04-29 MED ORDER — CICLOPIROX 8 % EX SOLN
Freq: Every day | CUTANEOUS | 0 refills | Status: DC
  Filled 2021-04-29: qty 6.6, 30d supply, fill #0

## 2021-04-29 MED ORDER — CELECOXIB 100 MG PO CAPS
100.0000 mg | ORAL_CAPSULE | Freq: Two times a day (BID) | ORAL | 0 refills | Status: AC
  Filled 2021-04-29: qty 180, 90d supply, fill #0

## 2021-04-30 DIAGNOSIS — K8020 Calculus of gallbladder without cholecystitis without obstruction: Secondary | ICD-10-CM | POA: Diagnosis not present

## 2021-04-30 DIAGNOSIS — K760 Fatty (change of) liver, not elsewhere classified: Secondary | ICD-10-CM | POA: Diagnosis not present

## 2021-05-01 NOTE — Progress Notes (Signed)
  Subjective:  Patient ID: Melissa Morgan, female    DOB: Aug 20, 1966,  MRN: [id number]  Chief Complaint  Patient presents with   Foot Pain    Bilateral foot pain x 1 year. Right foot more painful than left. Aching pain with occasional numbness in right toes 1-5. Pain located at heel and top of foot.    Bunions    Right foot bunion x 5 years. 1 metatarsal.     55 y.o. female presents with the above complaint. History confirmed with patient.  She also complains of discoloration of the toenails  Objective:  Physical Exam: warm, good capillary refill, no trophic changes or ulcerative lesions, normal DP and PT pulses, and normal sensory exam.  Bilaterally with right worse than left she has hallux limitus with pain on range of motion throughout the mid range of motion.  Palpable dorsal spurring.  Yellow discoloration of the hallux toenails  Radiographs: Multiple views x-ray of both feet: Joint space narrowing right worse than left on the first metatarsal phalangeal joint, she has a large dorsal spur on the first metatarsal bilaterally with right worse than left Assessment:   1. Hallux limitus, unspecified laterality   2. Osteoarthritis of first metatarsophalangeal (MTP) joint of both feet   3. Onychomycosis      Plan:  Patient was evaluated and treated and all questions answered.  Discussed treatment and etiology of hallux limitus in detail including surgical and nonsurgical treatment.  Nonsurgical we discussed living with the pain, injection therapy, anti-inflammatories and orthotic support.  Prescription for Celebrex sent to pharmacy.  She is interested in surgical correction.We discussed cheilectomy versus first metatarsophalangeal joint fusion.  She would like to proceed with cheilectomy, she understands that this will likely not be a long-term permanent solution and will likely need fusion at some point in her lifetime.  She would like to see if this will buy her time to complete working  until she has retired and then proceed with definitive correction at the later date.  She would like to plan for the fall.  She will return in 6 weeks for possible injection prior to her vacation and pick a surgical date for the fall  We also discussed discoloration of the toes and I suspect this is onychomycosis.  Prescription for Penlac sent to pharmacy.  Return in about 6 weeks (around 06/10/2021) for f/u arthritis right great toe, injection if needed, plan surgery for fall .

## 2021-05-06 ENCOUNTER — Other Ambulatory Visit (HOSPITAL_BASED_OUTPATIENT_CLINIC_OR_DEPARTMENT_OTHER): Payer: Self-pay

## 2021-05-06 MED ORDER — ALPRAZOLAM 0.5 MG PO TABS
ORAL_TABLET | ORAL | 1 refills | Status: DC
  Filled 2021-05-06: qty 45, 23d supply, fill #0
  Filled 2021-06-12: qty 45, 23d supply, fill #1

## 2021-05-07 DIAGNOSIS — I8312 Varicose veins of left lower extremity with inflammation: Secondary | ICD-10-CM | POA: Diagnosis not present

## 2021-05-07 DIAGNOSIS — I8311 Varicose veins of right lower extremity with inflammation: Secondary | ICD-10-CM | POA: Diagnosis not present

## 2021-05-28 DIAGNOSIS — I8311 Varicose veins of right lower extremity with inflammation: Secondary | ICD-10-CM | POA: Diagnosis not present

## 2021-05-28 DIAGNOSIS — I8312 Varicose veins of left lower extremity with inflammation: Secondary | ICD-10-CM | POA: Diagnosis not present

## 2021-06-11 DIAGNOSIS — I8311 Varicose veins of right lower extremity with inflammation: Secondary | ICD-10-CM | POA: Diagnosis not present

## 2021-06-11 DIAGNOSIS — I8312 Varicose veins of left lower extremity with inflammation: Secondary | ICD-10-CM | POA: Diagnosis not present

## 2021-06-12 ENCOUNTER — Other Ambulatory Visit (HOSPITAL_BASED_OUTPATIENT_CLINIC_OR_DEPARTMENT_OTHER): Payer: Self-pay

## 2021-06-19 ENCOUNTER — Other Ambulatory Visit (HOSPITAL_BASED_OUTPATIENT_CLINIC_OR_DEPARTMENT_OTHER): Payer: Self-pay

## 2021-06-19 MED ORDER — METOPROLOL SUCCINATE ER 25 MG PO TB24
25.0000 mg | ORAL_TABLET | Freq: Every day | ORAL | 0 refills | Status: DC
  Filled 2021-06-19: qty 90, 90d supply, fill #0

## 2021-07-07 ENCOUNTER — Other Ambulatory Visit (HOSPITAL_BASED_OUTPATIENT_CLINIC_OR_DEPARTMENT_OTHER): Payer: Self-pay

## 2021-07-07 MED ORDER — BUSPIRONE HCL 7.5 MG PO TABS
ORAL_TABLET | ORAL | 0 refills | Status: DC
  Filled 2021-07-07: qty 180, 90d supply, fill #0

## 2021-07-08 ENCOUNTER — Other Ambulatory Visit (HOSPITAL_BASED_OUTPATIENT_CLINIC_OR_DEPARTMENT_OTHER): Payer: Self-pay

## 2021-07-15 ENCOUNTER — Other Ambulatory Visit (HOSPITAL_BASED_OUTPATIENT_CLINIC_OR_DEPARTMENT_OTHER): Payer: Self-pay

## 2021-07-15 MED ORDER — ALPRAZOLAM 0.5 MG PO TABS
ORAL_TABLET | ORAL | 1 refills | Status: DC
  Filled 2021-07-15: qty 45, 22d supply, fill #0
  Filled 2021-08-15: qty 45, 22d supply, fill #1

## 2021-08-01 ENCOUNTER — Other Ambulatory Visit: Payer: Self-pay

## 2021-08-01 ENCOUNTER — Emergency Department (HOSPITAL_BASED_OUTPATIENT_CLINIC_OR_DEPARTMENT_OTHER)
Admission: EM | Admit: 2021-08-01 | Discharge: 2021-08-01 | Disposition: A | Discharge status: Home / Self Care | Payer: 59 | Attending: Emergency Medicine | Admitting: Emergency Medicine

## 2021-08-01 ENCOUNTER — Emergency Department (HOSPITAL_BASED_OUTPATIENT_CLINIC_OR_DEPARTMENT_OTHER): Payer: 59

## 2021-08-01 ENCOUNTER — Encounter (HOSPITAL_BASED_OUTPATIENT_CLINIC_OR_DEPARTMENT_OTHER): Payer: Self-pay | Admitting: *Deleted

## 2021-08-01 DIAGNOSIS — Z91040 Latex allergy status: Secondary | ICD-10-CM | POA: Diagnosis not present

## 2021-08-01 DIAGNOSIS — R1011 Right upper quadrant pain: Secondary | ICD-10-CM

## 2021-08-01 DIAGNOSIS — R112 Nausea with vomiting, unspecified: Secondary | ICD-10-CM | POA: Diagnosis not present

## 2021-08-01 DIAGNOSIS — K8050 Calculus of bile duct without cholangitis or cholecystitis without obstruction: Secondary | ICD-10-CM | POA: Diagnosis not present

## 2021-08-01 DIAGNOSIS — K760 Fatty (change of) liver, not elsewhere classified: Secondary | ICD-10-CM | POA: Diagnosis not present

## 2021-08-01 DIAGNOSIS — R1031 Right lower quadrant pain: Secondary | ICD-10-CM | POA: Diagnosis not present

## 2021-08-01 DIAGNOSIS — F17210 Nicotine dependence, cigarettes, uncomplicated: Secondary | ICD-10-CM | POA: Insufficient documentation

## 2021-08-01 DIAGNOSIS — K219 Gastro-esophageal reflux disease without esophagitis: Secondary | ICD-10-CM | POA: Diagnosis not present

## 2021-08-01 DIAGNOSIS — K8020 Calculus of gallbladder without cholecystitis without obstruction: Secondary | ICD-10-CM | POA: Diagnosis not present

## 2021-08-01 DIAGNOSIS — R110 Nausea: Secondary | ICD-10-CM | POA: Diagnosis not present

## 2021-08-01 DIAGNOSIS — R9431 Abnormal electrocardiogram [ECG] [EKG]: Secondary | ICD-10-CM | POA: Diagnosis not present

## 2021-08-01 LAB — COMPREHENSIVE METABOLIC PANEL
ALT: 14 U/L (ref 0–44)
AST: 18 U/L (ref 15–41)
Albumin: 4.3 g/dL (ref 3.5–5.0)
Alkaline Phosphatase: 69 U/L (ref 38–126)
Anion gap: 7 (ref 5–15)
BUN: 11 mg/dL (ref 6–20)
CO2: 25 mmol/L (ref 22–32)
Calcium: 9.3 mg/dL (ref 8.9–10.3)
Chloride: 106 mmol/L (ref 98–111)
Creatinine, Ser: 0.75 mg/dL (ref 0.44–1.00)
GFR, Estimated: >60 mL/min (ref >60)
Glucose, Bld: 103 mg/dL — ABNORMAL HIGH (ref 70–99)
Potassium: 3.7 mmol/L (ref 3.5–5.1)
Sodium: 138 mmol/L (ref 135–145)
Total Bilirubin: 0.4 mg/dL (ref 0.3–1.2)
Total Protein: 7.2 g/dL (ref 6.5–8.1)

## 2021-08-01 LAB — URINALYSIS, ROUTINE W REFLEX MICROSCOPIC
Bilirubin Urine: NEGATIVE
Glucose, UA: NEGATIVE mg/dL
Hgb urine dipstick: NEGATIVE
Ketones, ur: NEGATIVE mg/dL
Leukocytes,Ua: NEGATIVE
Nitrite: NEGATIVE
Protein, ur: NEGATIVE mg/dL
Specific Gravity, Urine: 1.015 (ref 1.005–1.030)
pH: 6 (ref 5.0–8.0)

## 2021-08-01 LAB — CBC
HCT: 41 % (ref 36.0–46.0)
Hemoglobin: 13.8 g/dL (ref 12.0–15.0)
MCH: 30.9 pg (ref 26.0–34.0)
MCHC: 33.7 g/dL (ref 30.0–36.0)
MCV: 91.7 fL (ref 80.0–100.0)
Platelets: 232 10*3/uL (ref 150–400)
RBC: 4.47 MIL/uL (ref 3.87–5.11)
RDW: 12.9 % (ref 11.5–15.5)
WBC: 7.8 10*3/uL (ref 4.0–10.5)
nRBC: 0 % (ref 0.0–0.2)

## 2021-08-01 LAB — LIPASE, BLOOD: Lipase: 33 U/L (ref 11–51)

## 2021-08-01 MED ORDER — PROCHLORPERAZINE EDISYLATE 10 MG/2ML IJ SOLN
5.0000 mg | Freq: Once | INTRAMUSCULAR | Status: AC
  Administered 2021-08-01: 5 mg via INTRAVENOUS
  Filled 2021-08-01: qty 2

## 2021-08-01 NOTE — ED Triage Notes (Signed)
C/o right abd pain with nausea after weeks , sent here from [facility] for eval

## 2021-08-01 NOTE — ED Provider Notes (Signed)
[name] DEPARTMENT Provider Note   CSN: [id number] Arrival date & time: 08/01/21  1550     History Chief Complaint  Patient presents with   Abdominal Pain    Melissa Morgan is a 55 y.o. female.   Abdominal Pain Associated symptoms: nausea and vomiting   Associated symptoms: no chest pain, no constipation, no diarrhea, no fever and no shortness of breath   Patient presents with abdominal pain.  Right upper abdomen.  Went to urgent care was sent here for further evaluation.  Has known gallstones.  States she will flareup sometimes.  States that now having more nausea.  Has had pain for weeks.  Has not seen general surgery yet.  States really does not want to have surgery.  No fevers or chills.  No diarrhea.  Pain is sharp.  Has been worse after eating.    Past Medical History:  Diagnosis Date   Anxiety    Arthritis    Brain aneurysm    Depression    DVT (deep venous thrombosis) (HCC)    Endometriosis    GERD (gastroesophageal reflux disease)    occ   Headache    Lacunar infarction (HCC)    PONV (postoperative nausea and vomiting)    extremely sick   Seizures (HCC)    tx 5 yrs in middle school  continuous   Stroke [facility])    cva  14   Tachycardia     Patient Active Problem List   Diagnosis Date Noted   Fatigue 08/06/2016   Loose stools 03/02/2016   Diffuse abdominal pain 03/02/2016   Chronic daily headache 04/14/2015   Aneurysm, cerebral, nonruptured 02/12/2015   Worsening headaches 02/12/2015   Breast pain, right 12/19/2014   Polyarthralgia 12/19/2014   Chest pain 10/01/2014   Nausea without vomiting 10/01/2014   GERD (gastroesophageal reflux disease) 07/30/2014   History of DVT (deep vein thrombosis) 07/23/2014   Cerebral aneurysm without rupture 07/06/2014   Depression with anxiety 03/23/2014    Past Surgical History:  Procedure Laterality Date   BREAST BIOPSY Right    CRANIOTOMY Right 07/06/2014   Procedure: CRANIOTOMY  INTRACRANIAL ANEURYSM FOR CAROTID;  Surgeon: Ashok Pall, MD;  Location: [place] [facility];  Service: Neurosurgery;  Laterality: Right;  right    lasar laparoscopy     for endometriosis x3     OB History   No obstetric history on file.     Family History  Problem Relation Age of Onset   Hypertension Mother    Heart attack Father    Hypertension Brother        2 out of 3 brothers   Heart attack Brother        2 out of 3 brothers   Diabetes Brother    Asthma Sister    Breast cancer Sister     Social History   Tobacco Use   Smoking status: Some Days    Packs/day: 0.00    Years: 30.00    Pack years: 0.00    Types: Cigarettes    Start date: 12/28/1983    Last attempt to quit: 05/26/2014    Years since quitting: 7.1   Smokeless tobacco: Never   Tobacco comments:    quit smoking 3 months ago  Vaping Use   Vaping Use: Never used  Substance Use Topics   Alcohol use: Yes    Comment: socially   Drug use: No    Home Medications Prior to Admission medications  Medication Sig Start Date End Date Taking? Authorizing Provider  acetaminophen (TYLENOL) 500 MG tablet Take 1,000 mg by mouth every 6 (six) hours as needed for mild pain or moderate pain.     [provider]  albuterol (PROVENTIL HFA;VENTOLIN HFA) 108 (90 Base) MCG/ACT inhaler Inhale 1-2 puffs into the lungs every 6 (six) hours as needed for wheezing or shortness of breath.    [provider]  ALPRAZolam [name]) 0.5 MG tablet Take 1 tablet (0.5 mg total) by mouth 2 (two) times daily as needed. for anxiety 10/13/18   Midge Minium, MD  ALPRAZolam [name]) 0.5 MG tablet Take 1/2-1 tablet (0.25-0.5 mg total) by mouth 2 (two)  times daily as needed for Anxiety. 07/15/21     Ascorbic Acid (VITAMIN C) 1000 MG tablet Take 1,000 mg by mouth 2 (two) times daily.    [provider]  B Complex Vitamins (VITAMIN B-COMPLEX) TABS Take 1 tablet by mouth daily.    [provider]  busPIRone (BUSPAR)  7.5 MG tablet Take 1 tablet (7.5 mg total) by mouth 2 times daily. 07/07/21     cetirizine (ZYRTEC) 10 MG tablet Take 10 mg by mouth daily.    [provider]  Cholecalciferol 2000 units CAPS Take 1 capsule (2,000 Units total) by mouth daily. 08/07/16   Midge Minium, MD  ciclopirox (PENLAC) 8 % solution Apply over nail and surrounding skin at bedtime. Apply daily over previous coat. After seven (7) days, may remove with alcohol and continue cycle. 04/29/21   McDonald, Stephan Minister, DPM  HYDROcodone-acetaminophen (NORCO/VICODIN) 5-325 MG tablet Take 1 tablet by mouth every 6 (six) hours as needed for severe pain. 02/05/19   Law, Bea Graff, PA-C  magnesium oxide (MAG-OX) 400 MG tablet Take 400 mg by mouth daily.    [provider]  metoCLOPramide (REGLAN) 10 MG tablet Take 1 tablet (10 mg total) by mouth every 6 (six) hours. 02/05/19   Law, Bea Graff, PA-C  metoprolol succinate (TOPROL-XL) 25 MG 24 hr tablet Take 1 tablet (25 mg total) by mouth daily. 04/13/18   Midge Minium, MD  metoprolol succinate (TOPROL-XL) 25 MG 24 hr tablet Take 1 tablet (25 mg total) by mouth daily. 06/19/21     omeprazole (PRILOSEC) 20 MG capsule Take 20 mg by mouth daily. 10/20/18   [provider]  Vitamin D, Ergocalciferol, (DRISDOL) 50000 units CAPS capsule Take 1 capsule (50,000 Units total) by mouth every 7 (seven) days. Patient not taking: Reported on 02/05/2019 04/14/18   Midge Minium, MD    Allergies    Imitrex [sumatriptan], Dilaudid [hydromorphone hcl], Ace inhibitors, Ondansetron, Tramadol, Codeine, and Latex  Review of Systems   Review of Systems  Constitutional:  Negative for fever.  HENT:  Negative for congestion.   Respiratory:  Negative for shortness of breath.   Cardiovascular:  Negative for chest pain.  Gastrointestinal:  Positive for abdominal pain, nausea and vomiting. Negative for constipation and diarrhea.  Endocrine: Negative for polyuria.  Genitourinary:   Negative for difficulty urinating and flank pain.  Musculoskeletal:  Negative for back pain.  Neurological:  Negative for weakness.  Psychiatric/Behavioral:  Negative for confusion.    Physical Exam Updated Vital Signs BP 101/75 (BP Location: Right Arm)   Pulse 63   Temp 98.3 F (36.8 C) (Oral)   Resp 16   Ht 5\' 8"  (1.727 m)   Wt 81.2 kg   LMP 12/28/2012   SpO2 100%   BMI 27.22  kg/m   Physical Exam Vitals and nursing note reviewed.  HENT:     Head: Normocephalic and atraumatic.  Pulmonary:     Breath sounds: Normal breath sounds.  Abdominal:     Comments: Mild right upper quadrant tenderness.  No rebound or guarding.  No hernia palpated.  Skin:    General: Skin is warm.     Capillary Refill: Capillary refill takes less than 2 seconds.  Neurological:     Mental Status: She is alert and oriented to person, place, and time.  Psychiatric:        Mood and Affect: Mood normal.    ED Results / Procedures / Treatments   Labs (all labs ordered are listed, but only abnormal results are displayed) Labs Reviewed  COMPREHENSIVE METABOLIC PANEL - Abnormal; Notable for the following components:      Result Value   Glucose, Bld 103 (*)    All other components within normal limits  LIPASE, BLOOD  CBC  URINALYSIS, ROUTINE W REFLEX MICROSCOPIC    EKG EKG Interpretation  Date/Time:  Friday August 01 2021 16:18:58 EDT Ventricular Rate:  78 PR Interval:  166 QRS Duration: 98 QT Interval:  388 QTC Calculation: 442 R Axis:   63 Text Interpretation: Sinus rhythm Consider left atrial enlargement Confirmed by Davonna Belling [phone]) on 08/01/2021 6:51:30 PM  Radiology US Abdomen Limited RUQ (LIVER/GB)  Result Date: 08/01/2021 CLINICAL DATA:  Right upper quadrant pain, nausea and weakness for 4 days. History of cholelithiasis. EXAM: ULTRASOUND ABDOMEN LIMITED RIGHT UPPER QUADRANT COMPARISON:  Ultrasound 04/30/2021 FINDINGS: Gallbladder: Physiologically distended. Intraluminal  gallstones and sludge. No gallbladder wall thickening or pericholecystic fluid. No sonographic Murphy sign noted by sonographer. Common bile duct: Diameter: 2 mm, normal. Liver: Mildly increased in parenchymal echogenicity. No focal lesion. No capsular nodularity. Portal vein is patent on color Doppler imaging with normal direction of blood flow towards the liver. Other: No right upper quadrant ascites. IMPRESSION: 1. Gallstones without sonographic findings of acute cholecystitis. 2. No biliary dilatation. 3. Mild hepatic steatosis. Electronically Signed   By: Keith Rake M.D.   On: 08/01/2021 18:34    Procedures Procedures   Medications Ordered in ED Medications  prochlorperazine (COMPAZINE) injection 5 mg (5 mg Intravenous Given 08/01/21 1711)    ED Course  I have reviewed the triage vital signs and the nursing notes.  Pertinent labs & imaging results that were available during my care of the patient were reviewed by me and considered in my medical decision making (see chart for details).    MDM Rules/Calculators/A&P                           Patient with right upper quadrant abdominal pain.  He has had for a while now.  Has had ultrasound that showed previous stones.  Has a CT scan from a couple years ago that was reassuring.  Lab work looks very good.  Normal lipase and liver function.  White count is not elevated.  Right upper quadrant tenderness and ultrasound showed stones without cholecystitis.  Does not have right lower quadrant tenderness.  Urgent care was reportedly worried about appendicitis but I have a low risk for this.  Not tender and normal white count.  Discussed with patient we deferred CT scan at this time.  We will follow-up with general surgery and hopefully can get her gallstones taken care of. Final Clinical Impression(s) / ED Diagnoses Final diagnoses:  RUQ pain  Biliary colic    Rx / DC Orders ED Discharge Orders     None        Davonna Belling,  MD 08/01/21 1907

## 2021-08-15 ENCOUNTER — Other Ambulatory Visit (HOSPITAL_BASED_OUTPATIENT_CLINIC_OR_DEPARTMENT_OTHER): Payer: Self-pay

## 2021-09-17 ENCOUNTER — Other Ambulatory Visit (HOSPITAL_BASED_OUTPATIENT_CLINIC_OR_DEPARTMENT_OTHER): Payer: Self-pay

## 2021-09-17 MED ORDER — ALPRAZOLAM 0.5 MG PO TABS
ORAL_TABLET | ORAL | 1 refills | Status: DC
  Filled 2021-09-17: qty 45, 22d supply, fill #0
  Filled 2021-10-29: qty 45, 22d supply, fill #1

## 2021-09-17 MED ORDER — METOPROLOL SUCCINATE ER 25 MG PO TB24
25.0000 mg | ORAL_TABLET | Freq: Every day | ORAL | 0 refills | Status: DC
  Filled 2021-09-17: qty 90, 90d supply, fill #0

## 2021-10-29 ENCOUNTER — Other Ambulatory Visit (HOSPITAL_BASED_OUTPATIENT_CLINIC_OR_DEPARTMENT_OTHER): Payer: Self-pay

## 2021-10-29 MED ORDER — BUSPIRONE HCL 7.5 MG PO TABS
ORAL_TABLET | ORAL | 0 refills | Status: DC
  Filled 2021-10-29: qty 180, 90d supply, fill #0

## 2021-10-31 ENCOUNTER — Other Ambulatory Visit: Payer: Self-pay

## 2021-10-31 ENCOUNTER — Ambulatory Visit: Payer: 59 | Admitting: Plastic Surgery

## 2021-10-31 ENCOUNTER — Encounter: Payer: Self-pay | Admitting: Plastic Surgery

## 2021-10-31 VITALS — BP 123/80 | HR 78 | Ht 68.0 in | Wt 178.0 lb

## 2021-10-31 DIAGNOSIS — I671 Cerebral aneurysm, nonruptured: Secondary | ICD-10-CM

## 2021-10-31 DIAGNOSIS — M952 Other acquired deformity of head: Secondary | ICD-10-CM

## 2021-10-31 DIAGNOSIS — Z86718 Personal history of other venous thrombosis and embolism: Secondary | ICD-10-CM | POA: Diagnosis not present

## 2021-10-31 DIAGNOSIS — F418 Other specified anxiety disorders: Secondary | ICD-10-CM | POA: Diagnosis not present

## 2021-10-31 NOTE — Progress Notes (Signed)
Patient ID: Melissa Morgan, female    DOB: [birth year], 55 y.o.   MRN: [id number]   Chief Complaint  Patient presents with   consult    The patient is a 55 year old female here for evaluation of her face.  In 2015 the patient presented to the emergency room with severe headaches but noted that that day the headaches were slightly different.  She had been hospitalized previously for migraines.  A CT scan was done and showed a middle cerebral artery aneurysm.  The MRI confirmed a 9.5 mm middle cerebral artery aneurysm that had not ruptured.  In August 2015 she went to the operating room with Dr. Christella Noa and underwent a craniotomy with clipping of her middle cerebral artery aneurysm.  She has a history of hypertension, arthritis, DVT, gastroesophageal reflux, lacunar infarct and anxiety.  Other than her cranial surgery she had a breast biopsy.  She is 5 feet 7 inches tall and weighs 178 pounds.  Her main complaint is that she does not like the way she looks in the mirror.  She has ptosis of her right brow and sunk in temporal area.  She has had this since surgery but has got to the point of wanting to do something about it.  She does not want to be dramatic or go through a big surgery.  But she is interested in her options.  She also mentioned that at the same time that the aneurysm was found she was going through a messy divorce.  She does have support from her daughter who is in the medical field.   Review of Systems  Constitutional: Negative.   HENT: Negative.    Eyes: Negative.   Respiratory: Negative.  Negative for chest tightness.   Cardiovascular: Negative.   Gastrointestinal: Negative.   Endocrine: Negative.   Genitourinary: Negative.   Skin: Negative.   Neurological:  Positive for headaches.  Hematological: Negative.   Psychiatric/Behavioral: Negative.     Past Medical History:  Diagnosis Date   Anxiety    Arthritis    Brain aneurysm    Depression    DVT (deep venous  thrombosis) (HCC)    Endometriosis    GERD (gastroesophageal reflux disease)    occ   Headache    Lacunar infarction (HCC)    PONV (postoperative nausea and vomiting)    extremely sick   Seizures (HCC)    tx 5 yrs in middle school  continuous   Stroke [facility])    cva  14   Tachycardia     Past Surgical History:  Procedure Laterality Date   BREAST BIOPSY Right    CRANIOTOMY Right 07/06/2014   Procedure: CRANIOTOMY INTRACRANIAL ANEURYSM FOR CAROTID;  Surgeon: Ashok Pall, MD;  Location: [place] [facility];  Service: Neurosurgery;  Laterality: Right;  right    lasar laparoscopy     for endometriosis x3      Current Outpatient Medications:    acetaminophen (TYLENOL) 500 MG tablet, Take 1,000 mg by mouth every 6 (six) hours as needed for mild pain or moderate pain. , Disp: , Rfl:    albuterol (PROVENTIL HFA;VENTOLIN HFA) 108 (90 Base) MCG/ACT inhaler, Inhale 1-2 puffs into the lungs every 6 (six) hours as needed for wheezing or shortness of breath., Disp: , Rfl:    ALPRAZolam (XANAX) 0.5 MG tablet, Take 1 tablet (0.5 mg total) by mouth 2 (two) times daily as needed. for anxiety, Disp: 60 tablet, Rfl: 3   ALPRAZolam (XANAX) 0.5 MG  tablet, Take 1/2-1 tablet (0.25-0.5 mg total) by mouth 2 (two)  times daily as needed for Anxiety., Disp: 45 tablet, Rfl: 1   Ascorbic Acid (VITAMIN C) 1000 MG tablet, Take 1,000 mg by mouth 2 (two) times daily., Disp: , Rfl:    B Complex Vitamins (VITAMIN B-COMPLEX) TABS, Take 1 tablet by mouth daily., Disp: , Rfl:    busPIRone (BUSPAR) 7.5 MG tablet, Take 1 tablet (7.5 mg total) by mouth 2 times daily., Disp: 180 tablet, Rfl: 0   cetirizine (ZYRTEC) 10 MG tablet, Take 10 mg by mouth daily., Disp: , Rfl:    Cholecalciferol 2000 units CAPS, Take 1 capsule (2,000 Units total) by mouth daily., Disp: 30 each, Rfl: 0   ciclopirox (PENLAC) 8 % solution, Apply over nail and surrounding skin at bedtime. Apply daily over previous coat. After seven (7) days, may remove with  alcohol and continue cycle., Disp: 6.6 mL, Rfl: 0   HYDROcodone-acetaminophen (NORCO/VICODIN) 5-325 MG tablet, Take 1 tablet by mouth every 6 (six) hours as needed for severe pain., Disp: 10 tablet, Rfl: 0   magnesium oxide (MAG-OX) 400 MG tablet, Take 400 mg by mouth daily., Disp: , Rfl:    metoCLOPramide (REGLAN) 10 MG tablet, Take 1 tablet (10 mg total) by mouth every 6 (six) hours., Disp: 30 tablet, Rfl: 0   metoprolol succinate (TOPROL-XL) 25 MG 24 hr tablet, Take 1 tablet (25 mg total) by mouth daily., Disp: 90 tablet, Rfl: 1   metoprolol succinate (TOPROL-XL) 25 MG 24 hr tablet, Take 1 tablet (25 mg total) by mouth daily., Disp: 90 tablet, Rfl: 0   omeprazole (PRILOSEC) 20 MG capsule, Take 20 mg by mouth daily., Disp: , Rfl:    Vitamin D, Ergocalciferol, (DRISDOL) 50000 units CAPS capsule, Take 1 capsule (50,000 Units total) by mouth every 7 (seven) days., Disp: 12 capsule, Rfl: 0   Objective:   Vitals:   10/31/21 0857  BP: 123/80  Pulse: 78  SpO2: 98%    Physical Exam Vitals and nursing note reviewed.  Constitutional:      Appearance: She is normal weight.  HENT:     Nose: Nose normal.  Cardiovascular:     Rate and Rhythm: Normal rate.     Pulses: Normal pulses.  Pulmonary:     Effort: Pulmonary effort is normal. No respiratory distress.     Breath sounds: No wheezing.  Abdominal:     General: There is no distension.     Palpations: Abdomen is soft.     Tenderness: There is no abdominal tenderness.  Musculoskeletal:        General: Deformity present. No swelling.  Skin:    General: Skin is warm.     Capillary Refill: Capillary refill takes less than 2 seconds.     Coloration: Skin is not jaundiced.     Findings: No bruising.  Neurological:     Mental Status: She is alert and oriented to person, place, and time.  Psychiatric:        Mood and Affect: Mood normal.        Behavior: Behavior normal.        Thought Content: Thought content normal.    Assessment &  Plan:  History of DVT (deep vein thrombosis)  Depression with anxiety  Cerebral aneurysm without rupture  Facial asymmetry, acquired  We discussed the options for open surgery with alloplastic material used.  She could also have a repositioning of her temporalis muscle.  The more minimal surgery  would be a filler to see if she was interested in doing anything further.  The downside is is that it is temporary.  She could also have her own fat used.  She would like to pursue using her own fat.  I have a call placed to Dr. Christella Noa and we will talk about the above information in further detail.  We will plan to do a telemetry visit with the patient in 3 to 4 weeks for further discussion.  I was able to review all of this with Dr. Christella Noa and he does not have any concerns about doing the fat grafting.  Pictures were obtained of the patient and placed in the chart with the patient's or guardian's permission.  [place], DO

## 2021-11-21 ENCOUNTER — Other Ambulatory Visit: Payer: Self-pay

## 2021-11-21 ENCOUNTER — Telehealth (INDEPENDENT_AMBULATORY_CARE_PROVIDER_SITE_OTHER): Payer: 59 | Admitting: Plastic Surgery

## 2021-11-21 ENCOUNTER — Encounter: Payer: Self-pay | Admitting: Plastic Surgery

## 2021-11-21 DIAGNOSIS — M952 Other acquired deformity of head: Secondary | ICD-10-CM | POA: Diagnosis not present

## 2021-11-21 NOTE — Progress Notes (Addendum)
° °  Subjective:    Patient ID: Melissa Morgan, female    DOB: 1966/05/13, 56 y.o.   MRN: [id number]  The patient is a 56 year old female joining me by phone for further discussion on her head reconstruction.  In 2015 she was seen for severe headaches and found to have a middle cerebral artery aneurysm.  It was confirmed by MRI and operated on by Dr. Christella Noa in August 2015.  She had a craniotomy with clipping of the middle cerebral artery.  Her medical history includes hypertension, arthritis, DVT, lacunar infarct and anxiety.  She is 5 feet 7 inches tall and weighs 178 pounds.  She has significant right brow ptosis with loss of volume in her temporal area on that right side.  I spoke with Dr. Christella Noa and he said no restrictions on surgery there.  Her daughter is in the medical field.  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   Review of Systems  Constitutional: Negative.   HENT: Negative.    Eyes: Negative.   Respiratory: Negative.    Cardiovascular: Negative.   Gastrointestinal: Negative.   Endocrine: Negative.   Genitourinary: Negative.   Neurological: Negative.       Objective:   Physical Exam     Assessment & Plan:     ICD-10-CM   1. Facial asymmetry, acquired  M95.2       The patient and I talked about her options again and feel that a dermal fat graft may be a very nice option for her that will have sustained results.  We will plan on moving forward with preauthorization.  The patient was at home and I was at the office.  I spent 15 minutes in the following manner: Review of chart, discussion with patient on options, documentation and request for surgery.  I connected with  [name] on 11/21/21 by phone and verified that I am speaking with the correct person using two identifiers.     I discussed the limitations of evaluation and management by telemedicine. The patient expressed understanding and agreed to proceed.

## 2021-12-04 ENCOUNTER — Other Ambulatory Visit (HOSPITAL_BASED_OUTPATIENT_CLINIC_OR_DEPARTMENT_OTHER): Payer: Self-pay

## 2021-12-04 MED ORDER — METOPROLOL SUCCINATE ER 25 MG PO TB24
25.0000 mg | ORAL_TABLET | Freq: Every day | ORAL | 0 refills | Status: DC
  Filled 2021-12-04: qty 90, 90d supply, fill #0

## 2021-12-05 ENCOUNTER — Other Ambulatory Visit (HOSPITAL_BASED_OUTPATIENT_CLINIC_OR_DEPARTMENT_OTHER): Payer: Self-pay

## 2021-12-05 ENCOUNTER — Other Ambulatory Visit: Payer: Self-pay | Admitting: Surgery

## 2021-12-05 DIAGNOSIS — K8020 Calculus of gallbladder without cholecystitis without obstruction: Secondary | ICD-10-CM | POA: Diagnosis not present

## 2021-12-05 MED ORDER — ALPRAZOLAM 0.5 MG PO TABS
ORAL_TABLET | ORAL | 1 refills | Status: DC
  Filled 2021-12-05: qty 45, 23d supply, fill #0
  Filled 2022-01-13: qty 45, 23d supply, fill #1

## 2021-12-26 DIAGNOSIS — K8020 Calculus of gallbladder without cholecystitis without obstruction: Secondary | ICD-10-CM | POA: Diagnosis not present

## 2021-12-26 DIAGNOSIS — E559 Vitamin D deficiency, unspecified: Secondary | ICD-10-CM | POA: Diagnosis not present

## 2021-12-26 DIAGNOSIS — N289 Disorder of kidney and ureter, unspecified: Secondary | ICD-10-CM | POA: Diagnosis not present

## 2021-12-26 DIAGNOSIS — R5383 Other fatigue: Secondary | ICD-10-CM | POA: Diagnosis not present

## 2021-12-26 DIAGNOSIS — R7303 Prediabetes: Secondary | ICD-10-CM | POA: Diagnosis not present

## 2021-12-26 DIAGNOSIS — M791 Myalgia, unspecified site: Secondary | ICD-10-CM | POA: Diagnosis not present

## 2021-12-31 ENCOUNTER — Encounter (HOSPITAL_BASED_OUTPATIENT_CLINIC_OR_DEPARTMENT_OTHER): Payer: Self-pay | Admitting: Surgery

## 2021-12-31 ENCOUNTER — Other Ambulatory Visit: Payer: Self-pay

## 2021-12-31 MED ORDER — CHLORHEXIDINE GLUCONATE CLOTH 2 % EX PADS: 6.0000 | MEDICATED_PAD | Freq: Once | CUTANEOUS | Status: DC

## 2021-12-31 MED ORDER — ENSURE PRE-SURGERY PO LIQD: 296.0000 mL | Freq: Once | ORAL | Status: DC

## 2021-12-31 NOTE — Progress Notes (Signed)

## 2022-01-06 NOTE — H&P (Signed)
REFERRING PHYSICIAN:  PROVIDER: Beverlee Nims, MD  MRN: [id number] DOB: [birth year]  Subjective   Chief Complaint: gallbladder    History of Present Illness: Melissa Morgan is a 56 y.o. female who is seen today for evaluation of gallbladder  .   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 She has been having episodes of epigastric abdominal pain with pain into the chest and right upper quadrant with occasional nausea and bloating. It is after fatty meals. She had an ultrasound in September which showed gallstones. There were no other abnormalities. She denies jaundice. She will have intermittent constipation and diarrhea during the attacks. He has had previous history of having had a cerebral aneurysm that required surgery. She is currently doing very well and has no cardiopulmonary issues.  Review of Systems: A complete review of systems was obtained from the patient. I have reviewed this information and discussed as appropriate with the patient. See HPI as well for other ROS.  ROS   Medical History: Past Medical History:  Diagnosis Date   Aneurysm (CMS-HCC)   Anxiety   DVT (deep venous thrombosis) (CMS-HCC)   There is no problem list on file for this patient.  Past Surgical History:  Procedure Laterality Date   crainiotomy N/A   LAPAROSCOPIC ENDOMETRIOSIS FULGURATION N/A    Allergies  Allergen Reactions   Latex Itching   Ondansetron Itching and Other (See Comments)  Constipation and depression Constipation and Depression Constipation and depression   Sumatriptan Other (See Comments) and Palpitations  Chest pressure Increase heart rate and chest pain Increase heart rate and chest pain   Tramadol Other (See Comments), Nausea and Palpitations  Palpitations; Dizziness Palpitations; Dizziness   Ace Inhibitors Other (See Comments)  REACTION: unknown Unknown Reaction - Occurred on Operating Table per the patient REACTION: unknown    Hydromorphone Nausea And Vomiting   Naproxen Sodium Rash   Codeine Nausea   Current Outpatient Medications on File Prior to Visit  Medication Sig Dispense Refill   ALPRAZolam (XANAX) 0.5 MG tablet Take 1/2 - 1 tablet (0.25-0.5 mg total) by mouth 2 (two) times daily as needed for Anxiety.   busPIRone (BUSPAR) 7.5 MG tablet Take 1 tablet (7.5 mg total) by mouth 2 times daily.   metoprolol succinate (TOPROL-XL) 25 MG XL tablet Take 1 tablet by mouth once daily   ascorbic acid, vitamin C, (VITAMIN C) 1000 MG tablet Take 1,000 mg by mouth 2 (two) times daily   B-complex with vitamin C TbER Take 1 tablet by mouth once daily   No current facility-administered medications on file prior to visit.   Family History  Problem Relation Age of Onset   High blood pressure (Hypertension) Mother   Heart valve disease Father    Social History   Tobacco Use  Smoking Status Former   Types: Cigarettes  Smokeless Tobacco Never    Social History   Socioeconomic History   Marital status: Divorced  Tobacco Use   Smoking status: Former  Types: Cigarettes   Smokeless tobacco: Never  Scientific laboratory technician Use: Never used  Substance and Sexual Activity   Alcohol use: Never   Drug use: Never   Objective:   Vitals:   BP: 122/78  Pulse: 78  Temp: 36.7 C (98 F)  SpO2: 99%  Weight: 82.8 kg (182 lb 9.6 oz)  Height: 172.7 cm (5\' 8" )   Body mass index is 27.76 kg/m.  Physical Exam   She appears well  on exam.  Her abdomen is soft and nontender today. There is no guarding. There are no masses  Lungs clear  CV RRR  No hepatomegaly  Labs, Imaging and Diagnostic Testing: I have reviewed her previous ultrasound of the gallbladder  Assessment and Plan:   Diagnoses and all orders for this visit:  Symptomatic cholelithiasis    Based on her symptoms and ultrasound, this is consistent with biliary colic. I discussed this in detail with her. I would recommend a laparoscopic  cholecystectomy. I gave her literature regarding this we discussed the procedure in detail. We discussed the risks which includes but is not limited to bleeding, infection, injury to surrounding structures, the need to convert to an open procedure, cardiopulmonary issues, postoperative recovery, etc. She understands and wishes to proceed with surgery which will be scheduled.

## 2022-01-07 ENCOUNTER — Other Ambulatory Visit: Payer: Self-pay

## 2022-01-07 ENCOUNTER — Other Ambulatory Visit (HOSPITAL_BASED_OUTPATIENT_CLINIC_OR_DEPARTMENT_OTHER): Payer: Self-pay

## 2022-01-07 ENCOUNTER — Ambulatory Visit (HOSPITAL_BASED_OUTPATIENT_CLINIC_OR_DEPARTMENT_OTHER): Payer: 59 | Admitting: Anesthesiology

## 2022-01-07 ENCOUNTER — Encounter (HOSPITAL_BASED_OUTPATIENT_CLINIC_OR_DEPARTMENT_OTHER)
Admission: RE | Disposition: A | Discharge status: Home / Self Care | Payer: Self-pay | Source: Home / Self Care | Attending: Surgery

## 2022-01-07 ENCOUNTER — Encounter (HOSPITAL_BASED_OUTPATIENT_CLINIC_OR_DEPARTMENT_OTHER): Payer: Self-pay | Admitting: Surgery

## 2022-01-07 ENCOUNTER — Ambulatory Visit (HOSPITAL_BASED_OUTPATIENT_CLINIC_OR_DEPARTMENT_OTHER)
Admission: RE | Admit: 2022-01-07 | Discharge: 2022-01-07 | Disposition: A | Discharge status: Home / Self Care | Payer: 59 | Attending: Surgery | Admitting: Surgery

## 2022-01-07 DIAGNOSIS — Z6730 Type AB blood, Rh positive: Secondary | ICD-10-CM | POA: Diagnosis not present

## 2022-01-07 DIAGNOSIS — K219 Gastro-esophageal reflux disease without esophagitis: Secondary | ICD-10-CM | POA: Diagnosis not present

## 2022-01-07 DIAGNOSIS — Z86718 Personal history of other venous thrombosis and embolism: Secondary | ICD-10-CM | POA: Diagnosis not present

## 2022-01-07 DIAGNOSIS — K811 Chronic cholecystitis: Secondary | ICD-10-CM | POA: Diagnosis not present

## 2022-01-07 DIAGNOSIS — K8020 Calculus of gallbladder without cholecystitis without obstruction: Secondary | ICD-10-CM | POA: Diagnosis not present

## 2022-01-07 DIAGNOSIS — F32A Depression, unspecified: Secondary | ICD-10-CM | POA: Diagnosis not present

## 2022-01-07 DIAGNOSIS — K8010 Calculus of gallbladder with chronic cholecystitis without obstruction: Secondary | ICD-10-CM | POA: Diagnosis not present

## 2022-01-07 DIAGNOSIS — F419 Anxiety disorder, unspecified: Secondary | ICD-10-CM | POA: Insufficient documentation

## 2022-01-07 DIAGNOSIS — G40909 Epilepsy, unspecified, not intractable, without status epilepticus: Secondary | ICD-10-CM | POA: Insufficient documentation

## 2022-01-07 DIAGNOSIS — Z87891 Personal history of nicotine dependence: Secondary | ICD-10-CM | POA: Insufficient documentation

## 2022-01-07 DIAGNOSIS — Z79899 Other long term (current) drug therapy: Secondary | ICD-10-CM | POA: Insufficient documentation

## 2022-01-07 HISTORY — PX: CHOLECYSTECTOMY: SHX55

## 2022-01-07 HISTORY — DX: Sleep apnea, unspecified: G47.30

## 2022-01-07 SURGERY — LAPAROSCOPIC CHOLECYSTECTOMY
Anesthesia: General | Site: Abdomen

## 2022-01-07 MED ORDER — SUCCINYLCHOLINE CHLORIDE 200 MG/10ML IV SOSY
PREFILLED_SYRINGE | INTRAVENOUS | Status: DC | PRN
  Administered 2022-01-07: 120 mg via INTRAVENOUS

## 2022-01-07 MED ORDER — CEFAZOLIN SODIUM-DEXTROSE 2-4 GM/100ML-% IV SOLN
INTRAVENOUS | Status: AC
  Filled 2022-01-07: qty 100

## 2022-01-07 MED ORDER — CEFAZOLIN SODIUM-DEXTROSE 2-4 GM/100ML-% IV SOLN
2.0000 g | INTRAVENOUS | Status: AC
  Administered 2022-01-07: 2 g via INTRAVENOUS

## 2022-01-07 MED ORDER — AMISULPRIDE (ANTIEMETIC) 5 MG/2ML IV SOLN
INTRAVENOUS | Status: AC
  Filled 2022-01-07: qty 4

## 2022-01-07 MED ORDER — MIDAZOLAM HCL 2 MG/2ML IJ SOLN
INTRAMUSCULAR | Status: AC
  Filled 2022-01-07: qty 2

## 2022-01-07 MED ORDER — FENTANYL CITRATE (PF) 100 MCG/2ML IJ SOLN
INTRAMUSCULAR | Status: DC | PRN
  Administered 2022-01-07: 100 ug via INTRAVENOUS
  Administered 2022-01-07 (×2): 50 ug via INTRAVENOUS

## 2022-01-07 MED ORDER — LACTATED RINGERS IV SOLN: INTRAVENOUS | Status: DC

## 2022-01-07 MED ORDER — FENTANYL CITRATE (PF) 100 MCG/2ML IJ SOLN
INTRAMUSCULAR | Status: AC
  Filled 2022-01-07: qty 2

## 2022-01-07 MED ORDER — OXYCODONE HCL 5 MG PO TABS
5.0000 mg | ORAL_TABLET | Freq: Four times a day (QID) | ORAL | 0 refills | Status: AC | PRN
  Filled 2022-01-07: qty 25, 7d supply, fill #0

## 2022-01-07 MED ORDER — EPHEDRINE SULFATE (PRESSORS) 50 MG/ML IJ SOLN
INTRAMUSCULAR | Status: DC | PRN
  Administered 2022-01-07 (×2): 10 mg via INTRAVENOUS

## 2022-01-07 MED ORDER — SODIUM CHLORIDE 0.9 % IR SOLN
Status: DC | PRN
  Administered 2022-01-07: 1

## 2022-01-07 MED ORDER — METOCLOPRAMIDE HCL 5 MG/ML IJ SOLN: 10.0000 mg | Freq: Once | INTRAMUSCULAR | Status: DC | PRN

## 2022-01-07 MED ORDER — ONDANSETRON HCL 4 MG/2ML IJ SOLN
INTRAMUSCULAR | Status: AC
  Filled 2022-01-07: qty 2

## 2022-01-07 MED ORDER — ROCURONIUM BROMIDE 100 MG/10ML IV SOLN
INTRAVENOUS | Status: DC | PRN
  Administered 2022-01-07: 35 mg via INTRAVENOUS
  Administered 2022-01-07: 10 mg via INTRAVENOUS

## 2022-01-07 MED ORDER — PROPOFOL 10 MG/ML IV BOLUS
INTRAVENOUS | Status: AC
  Filled 2022-01-07: qty 20

## 2022-01-07 MED ORDER — AMISULPRIDE (ANTIEMETIC) 5 MG/2ML IV SOLN
10.0000 mg | Freq: Once | INTRAVENOUS | Status: AC | PRN
  Administered 2022-01-07: 10 mg via INTRAVENOUS

## 2022-01-07 MED ORDER — MIDAZOLAM HCL 5 MG/5ML IJ SOLN
INTRAMUSCULAR | Status: DC | PRN
Start: 2022-01-07 — End: 2022-01-07
  Administered 2022-01-07: 2 mg via INTRAVENOUS

## 2022-01-07 MED ORDER — PROPOFOL 10 MG/ML IV BOLUS
INTRAVENOUS | Status: DC | PRN
  Administered 2022-01-07: 160 mg via INTRAVENOUS

## 2022-01-07 MED ORDER — SUGAMMADEX SODIUM 200 MG/2ML IV SOLN
INTRAVENOUS | Status: DC | PRN
  Administered 2022-01-07: 200 mg via INTRAVENOUS

## 2022-01-07 MED ORDER — FENTANYL CITRATE (PF) 100 MCG/2ML IJ SOLN
INTRAMUSCULAR | Status: AC
Start: 2022-01-07 — End: ?
  Filled 2022-01-07: qty 2

## 2022-01-07 MED ORDER — ACETAMINOPHEN 500 MG PO TABS
ORAL_TABLET | ORAL | Status: AC
  Filled 2022-01-07: qty 2

## 2022-01-07 MED ORDER — KETOROLAC TROMETHAMINE 30 MG/ML IJ SOLN
INTRAMUSCULAR | Status: AC
  Filled 2022-01-07: qty 1

## 2022-01-07 MED ORDER — ACETAMINOPHEN 500 MG PO TABS
1000.0000 mg | ORAL_TABLET | ORAL | Status: AC
  Administered 2022-01-07: 1000 mg via ORAL

## 2022-01-07 MED ORDER — SCOPOLAMINE 1 MG/3DAYS TD PT72
1.0000 | MEDICATED_PATCH | TRANSDERMAL | Status: DC
  Administered 2022-01-07: 1.5 mg via TRANSDERMAL

## 2022-01-07 MED ORDER — DEXAMETHASONE SODIUM PHOSPHATE 4 MG/ML IJ SOLN
INTRAMUSCULAR | Status: DC | PRN
  Administered 2022-01-07: 4 mg via INTRAVENOUS

## 2022-01-07 MED ORDER — BUPIVACAINE-EPINEPHRINE (PF) 0.5% -1:200000 IJ SOLN
INTRAMUSCULAR | Status: DC | PRN
  Administered 2022-01-07: 20 mL

## 2022-01-07 MED ORDER — SCOPOLAMINE 1 MG/3DAYS TD PT72
MEDICATED_PATCH | TRANSDERMAL | Status: AC
  Filled 2022-01-07: qty 1

## 2022-01-07 MED ORDER — KETOROLAC TROMETHAMINE 30 MG/ML IJ SOLN
30.0000 mg | Freq: Once | INTRAMUSCULAR | Status: AC
Start: 2022-01-07 — End: 2022-01-07
  Administered 2022-01-07: 30 mg via INTRAVENOUS

## 2022-01-07 MED ORDER — LIDOCAINE HCL (CARDIAC) PF 100 MG/5ML IV SOSY
PREFILLED_SYRINGE | INTRAVENOUS | Status: DC | PRN
  Administered 2022-01-07: 60 mg via INTRAVENOUS

## 2022-01-07 MED ORDER — FENTANYL CITRATE (PF) 100 MCG/2ML IJ SOLN
25.0000 ug | INTRAMUSCULAR | Status: DC | PRN
  Administered 2022-01-07 (×3): 50 ug via INTRAVENOUS

## 2022-01-07 SURGICAL SUPPLY — 42 items
ADH SKN CLS APL DERMABOND .7 (GAUZE/BANDAGES/DRESSINGS) ×1
APL PRP STRL LF DISP 70% ISPRP (MISCELLANEOUS) ×1
APPLIER CLIP 5 13 M/L LIGAMAX5 (MISCELLANEOUS) ×2
APR CLP MED LRG 5 ANG JAW (MISCELLANEOUS) ×1
BAG SPEC RTRVL LRG 6X4 10 (ENDOMECHANICALS) ×1
BLADE CLIPPER SURG (BLADE) IMPLANT
CHLORAPREP W/TINT 26 (MISCELLANEOUS) ×2 IMPLANT
CLIP APPLIE 5 13 M/L LIGAMAX5 (MISCELLANEOUS) ×1 IMPLANT
COVER MAYO STAND STRL (DRAPES) IMPLANT
DERMABOND ADVANCED (GAUZE/BANDAGES/DRESSINGS) ×1
DERMABOND ADVANCED .7 DNX12 (GAUZE/BANDAGES/DRESSINGS) ×1 IMPLANT
DRAPE C-ARM 42X72 X-RAY (DRAPES) IMPLANT
ELECT REM PT RETURN 9FT ADLT (ELECTROSURGICAL) ×2
ELECTRODE REM PT RTRN 9FT ADLT (ELECTROSURGICAL) ×1 IMPLANT
GAUZE 4X4 16PLY ~~LOC~~+RFID DBL (SPONGE) ×1 IMPLANT
GLOVE SURG POLYISO LF SZ6.5 (GLOVE) ×2 IMPLANT
GLOVE SURG SIGNA 7.5 PF LTX (GLOVE) ×2 IMPLANT
GLOVE SURG SYN 7.5  E (GLOVE) ×1
GLOVE SURG SYN 7.5 E (GLOVE) ×1 IMPLANT
GLOVE SURG SYN 7.5 PF PI (GLOVE) IMPLANT
GLOVE SURG UNDER POLY LF SZ7 (GLOVE) ×3 IMPLANT
GOWN STRL REUS W/ TWL LRG LVL3 (GOWN DISPOSABLE) ×2 IMPLANT
GOWN STRL REUS W/ TWL XL LVL3 (GOWN DISPOSABLE) ×1 IMPLANT
GOWN STRL REUS W/TWL LRG LVL3 (GOWN DISPOSABLE) ×4
GOWN STRL REUS W/TWL XL LVL3 (GOWN DISPOSABLE) ×6
IRRIG SUCT STRYKERFLOW 2 WTIP (MISCELLANEOUS) ×2
IRRIGATION SUCT STRKRFLW 2 WTP (MISCELLANEOUS) ×1 IMPLANT
NS IRRIG 1000ML POUR BTL (IV SOLUTION) ×2 IMPLANT
PACK BASIN DAY SURGERY FS (CUSTOM PROCEDURE TRAY) ×2 IMPLANT
POUCH SPECIMEN RETRIEVAL 10MM (ENDOMECHANICALS) ×2 IMPLANT
SCISSORS LAP 5X35 DISP (ENDOMECHANICALS) ×2 IMPLANT
SET CHOLANGIOGRAPH 5 50 .035 (SET/KITS/TRAYS/PACK) IMPLANT
SET TUBE SMOKE EVAC HIGH FLOW (TUBING) ×2 IMPLANT
SLEEVE ENDOPATH XCEL 5M (ENDOMECHANICALS) ×4 IMPLANT
SLEEVE SCD COMPRESS KNEE MED (STOCKING) ×2 IMPLANT
SPIKE FLUID TRANSFER (MISCELLANEOUS) ×2 IMPLANT
SUT MON AB 4-0 PC3 18 (SUTURE) ×2 IMPLANT
TOWEL GREEN STERILE FF (TOWEL DISPOSABLE) ×2 IMPLANT
TRAY LAPAROSCOPIC (CUSTOM PROCEDURE TRAY) ×2 IMPLANT
TROCAR XCEL BLUNT TIP 100MML (ENDOMECHANICALS) ×2 IMPLANT
TROCAR XCEL NON-BLD 5MMX100MML (ENDOMECHANICALS) ×2 IMPLANT
TUBE CONNECTING 20X1/4 (TUBING) ×2 IMPLANT

## 2022-01-07 NOTE — Discharge Instructions (Addendum)
[facility] ______[facility] ?LAPAROSCOPIC SURGERY: POST OP INSTRUCTIONS ?Always review your discharge instruction sheet given to you by the facility where your surgery was performed. ?IF YOU HAVE DISABILITY OR FAMILY LEAVE FORMS, YOU MUST BRING THEM TO THE OFFICE FOR PROCESSING.   ?DO NOT GIVE THEM TO YOUR DOCTOR. ? ?A prescription for pain medication may be given to you upon discharge.  Take your pain medication as prescribed, if needed.  If narcotic pain medicine is not needed, then you may take acetaminophen (Tylenol) or ibuprofen (Advil) as needed. NEXT DOSE OF TYLENOL AFTER 7PM AS NEEDED FOR PAIN. ?Take your usually prescribed medications unless otherwise directed. ?If you need a refill on your pain medication, please contact your pharmacy.  They will contact our office to request authorization. Prescriptions will not be filled after 5pm or on week-ends. ?You should follow a light diet the first few days after arrival home, such as soup and crackers, etc.  Be sure to include lots of fluids daily. ?Most patients will experience some swelling and bruising in the area of the incisions.  Ice packs will help.  Swelling and bruising can take several days to resolve.  ?It is common to experience some constipation if taking pain medication after surgery.  Increasing fluid intake and taking a stool softener (such as Colace) will usually help or prevent this problem from occurring.  A mild laxative (Milk of Magnesia or Miralax) should be taken according to package instructions if there are no bowel movements after 48 hours. ?Unless discharge instructions indicate otherwise, you may remove your bandages 24-48 hours after surgery, and you may shower at that time.  You may have steri-strips (small skin tapes) in place directly over the incision.  These strips should be left on the skin for 7-10 days.  If your surgeon used skin glue on the incision, you may shower in 24 hours.  The glue will flake off over the  next 2-3 weeks.  Any sutures or staples will be removed at the office during your follow-up visit. ?ACTIVITIES:  You may resume regular (light) daily activities beginning the next day--such as daily self-care, walking, climbing stairs--gradually increasing activities as tolerated.  You may have sexual intercourse when it is comfortable.  Refrain from any heavy lifting or straining until approved by your doctor. ?You may drive when you are no longer taking prescription pain medication, you can comfortably wear a seatbelt, and you can safely maneuver your car and apply brakes. ?RETURN TO WORK:  __________________________________________________________ ?You should see your doctor in the office for a follow-up appointment approximately 2-3 weeks after your surgery.  Make sure that you call for this appointment within a day or two after you arrive home to insure a convenient appointment time. ?OTHER INSTRUCTIONS: OK TO SHOWER STARTING TOMORROW ?ICE PACK, TYLENOL, AND IBUPROFEN ALSO FOR PAIN ?NO LIFTING MORE THAN 15 TO 20 POUNDS FOR 2 WEEKS ?__________________________________________________________________________________________________________________________ __________________________________________________________________________________________________________________________ ?WHEN TO CALL YOUR DOCTOR: ?Fever over 101.0 ?Inability to urinate ?Continued bleeding from incision. ?Increased pain, redness, or drainage from the incision. ?Increasing abdominal pain ? ?The clinic staff is available to answer your questions during regular business hours.  Please don?t hesitate to call and ask to speak to one of the nurses for clinical concerns.  If you have a medical emergency, go to the nearest emergency room or call 911.  A surgeon from [facility] is always on call at the hospital. ?[address] [place], [place], [place]  [address] ? [address], [place], [place]   [address] ?[phone] ?  [phone] ? FAX [phone] ?Web site: [email or web address]  ? ? ?Post Anesthesia Home Care Instructions ? ?Activity: ?Get plenty of rest for the remainder of the day. A responsible individual must stay with you for 24 hours following the procedure.  ?For the next 24 hours, DO NOT: ?-Drive a car ?-Paediatric nurse ?-Drink alcoholic beverages ?-Take any medication unless instructed by your physician ?-Make any legal decisions or sign important papers. ? ?Meals: ?Start with liquid foods such as gelatin or soup. Progress to regular foods as tolerated. Avoid greasy, spicy, heavy foods. If nausea and/or vomiting occur, drink only clear liquids until the nausea and/or vomiting subsides. Call your physician if vomiting continues. ? ?Special Instructions/Symptoms: ?Your throat may feel dry or sore from the anesthesia or the breathing tube placed in your throat during surgery. If this causes discomfort, gargle with warm salt water. The discomfort should disappear within 24 hours. ? ?If you had a scopolamine patch placed behind your ear for the management of post- operative nausea and/or vomiting: ? ?1. The medication in the patch is effective for 72 hours, after which it should be removed.  Wrap patch in a tissue and discard in the trash. Wash hands thoroughly with soap and water. ?2. You may remove the patch earlier than 72 hours if you experience unpleasant side effects which may include dry mouth, dizziness or visual disturbances. ?3. Avoid touching the patch. Wash your hands with soap and water after contact with the patch. ?    ?

## 2022-01-07 NOTE — Op Note (Signed)
Laparoscopic Cholecystectomy Procedure Note ? ?Indications: This patient presents with symptomatic gallbladder disease and will undergo laparoscopic cholecystectomy. ? ?Pre-operative Diagnosis: symptomatic cholelithiasis ? ?Post-operative Diagnosis: Same ? ?Surgeon: Coralie Keens  ? ?Assistants: Albin Felling, MD [facility] Resident ? ?Anesthesia: General endotracheal anesthesia ? ?ASA Class: 2 ? ?Procedure Details  ?The patient was seen again in the Holding Room. The risks, benefits, complications, treatment options, and expected outcomes were discussed with the patient. The possibilities of reaction to medication, pulmonary aspiration, perforation of viscus, bleeding, recurrent infection, finding a normal gallbladder, the need for additional procedures, failure to diagnose a condition, the possible need to convert to an open procedure, and creating a complication requiring transfusion or operation were discussed with the patient. The likelihood of improving the patient's symptoms with return to their baseline status is good.  The patient and/or family concurred with the proposed plan, giving informed consent. The site of surgery properly noted. The patient was taken to Operating Room, identified as [facility] and the procedure verified as Laparoscopic Cholecystectomy with Intraoperative Cholangiogram. A Time Out was held and the above information confirmed. ? ?Prior to the induction of general anesthesia, antibiotic prophylaxis was administered. General endotracheal anesthesia was then administered and tolerated well. After the induction, the abdomen was prepped with Chloraprep and draped in sterile fashion. The patient was positioned in the supine position. ? ?Local anesthetic agent was injected into the skin near the umbilicus and an incision made. We dissected down to the abdominal fascia with blunt dissection.  The fascia was incised vertically and we entered the peritoneal cavity bluntly.  A pursestring  suture of 0-Vicryl was placed around the fascial opening.  The Hasson cannula was inserted and secured with the stay suture.  Pneumoperitoneum was then created with CO2 and tolerated well without any adverse changes in the patient's vital signs. A 5-mm port was placed in the subxiphoid position.  Two 5-mm ports were placed in the right upper quadrant. All skin incisions were infiltrated with a local anesthetic agent before making the incision and placing the trocars.  ? ?We positioned the patient in reverse Trendelenburg, tilted slightly to the patient's left.  The gallbladder was identified, the fundus grasped and retracted cephalad. Adhesions were lysed bluntly and with the electrocautery where indicated, taking care not to injure any adjacent organs or viscus. The infundibulum was grasped and retracted laterally, exposing the peritoneum overlying the triangle of Calot. This was then divided and exposed in a blunt fashion. The cystic duct was clearly identified and bluntly dissected circumferentially. A critical view of the cystic duct and cystic artery was obtained.  The cystic duct was then ligated with clips and divided. The cystic artery was, dissected free, ligated with clips and divided as well.  ? ?The gallbladder was dissected from the liver bed in retrograde fashion with the electrocautery. The gallbladder was removed and placed in an Endocatch sac. The liver bed was irrigated and inspected. Hemostasis was achieved with the electrocautery. Copious irrigation was utilized and was repeatedly aspirated until clear.  The gallbladder and Endocatch sac were then removed through the umbilical port site.  The pursestring suture was used to close the umbilical fascia.   ? ?We again inspected the right upper quadrant for hemostasis.  Pneumoperitoneum was released as we removed the trocars.  4-0 Monocryl was used to close the skin.  Skin glue was then applied. The patient was then extubated and brought to the  recovery room in stable condition. Instrument, sponge, and  needle counts were correct at closure and at the conclusion of the case.  ? ?Findings: ?Chronic Cholecystitis without Cholelithiasis ? ?Estimated Blood Loss: Minimal ?        ?Drains: none ?        ?Specimens: Gallbladder     ?      ?Complications: None; patient tolerated the procedure well. ?        ?Disposition: PACU - hemodynamically stable. ?        ?Condition: stable ? ?  ?

## 2022-01-07 NOTE — Anesthesia Procedure Notes (Signed)
Procedure Name: Intubation ?Date/Time: 01/07/2022 2:14 PM ?Performed by: Glory Buff, CRNA ?Pre-anesthesia Checklist: Patient identified, Emergency Drugs available, Suction available and Patient being monitored ?Patient Re-evaluated:Patient Re-evaluated prior to induction ?Oxygen Delivery Method: Circle system utilized ?Preoxygenation: Pre-oxygenation with 100% oxygen ?Induction Type: IV induction ?Ventilation: Mask ventilation without difficulty ?Laryngoscope Size: Sabra Heck and 3 ?Grade View: Grade I ?Tube type: Oral ?Tube size: 7.0 mm ?Number of attempts: 1 ?Airway Equipment and Method: Stylet and Oral airway ?Placement Confirmation: ETT inserted through vocal cords under direct vision, positive ETCO2 and breath sounds checked- equal and bilateral ?Secured at: 21 cm ?Tube secured with: Tape ?Dental Injury: Teeth and Oropharynx as per pre-operative assessment  ? ? ? ? ?

## 2022-01-07 NOTE — Anesthesia Postprocedure Evaluation (Signed)
Anesthesia Post Note ? ?Patient: Melissa Morgan ? ?Procedure(s) Performed: LAPAROSCOPIC CHOLECYSTECTOMY (Abdomen) ? ?  ? ?Patient location during evaluation: PACU ?Anesthesia Type: General ?Level of consciousness: awake and alert and oriented ?Pain management: pain level controlled ?Vital Signs Assessment: post-procedure vital signs reviewed and stable ?Respiratory status: spontaneous breathing, nonlabored ventilation and respiratory function stable ?Cardiovascular status: blood pressure returned to baseline and stable ?Postop Assessment: no apparent nausea or vomiting ?Anesthetic complications: no ? ? ?No notable events documented. ? ?Last Vitals:  ?Vitals:  ? 01/07/22 1530 01/07/22 1537  ?BP: 100/66   ?Pulse: 77 74  ?Resp: 15 17  ?Temp:    ?SpO2: 99% 98%  ?  ?Last Pain:  ?Vitals:  ? 01/07/22 1537  ?TempSrc:   ?PainSc: 7   ? ? ?  ?  ?  ?  ?  ?  ? ?Ebonie Westerlund A. ? ? ? ? ?

## 2022-01-07 NOTE — Transfer of Care (Signed)
Immediate Anesthesia Transfer of Care Note ? ?Patient: Melissa Morgan ? ?Procedure(s) Performed: LAPAROSCOPIC CHOLECYSTECTOMY (Abdomen) ? ?Patient Location: PACU ? ?Anesthesia Type:General ? ?Level of Consciousness: drowsy, patient cooperative and responds to stimulation ? ?Airway & Oxygen Therapy: Patient Spontanous Breathing and Patient connected to face mask oxygen ? ?Post-op Assessment: Report given to RN and Post -op Vital signs reviewed and stable ? ?Post vital signs: Reviewed and stable ? ?Last Vitals:  ?Vitals Value Taken Time  ?BP    ?Temp    ?Pulse    ?Resp    ?SpO2    ? ? ?Last Pain:  ?Vitals:  ? 01/07/22 1253  ?TempSrc: Oral  ?PainSc: 0-No pain  ?   ? ?Patients Stated Pain Goal: 4 (01/07/22 1253) ? ?Complications: No notable events documented. ?

## 2022-01-07 NOTE — Interval H&P Note (Signed)
History and Physical Interval Note:no change in H and P ? ?01/07/2022 ?12:30 PM ? ?Melissa Morgan  has presented today for surgery, with the diagnosis of SYMPTOMATIC GALLSTONES.  The various methods of treatment have been discussed with the patient and family. After consideration of risks, benefits and other options for treatment, the patient has consented to  Procedure(s): ?LAPAROSCOPIC CHOLECYSTECTOMY (N/A) as a surgical intervention.  The patient's history has been reviewed, patient examined, no change in status, stable for surgery.  I have reviewed the patient's chart and labs.  Questions were answered to the patient's satisfaction.   ? ? ?Coralie Keens ? ? ?

## 2022-01-07 NOTE — Anesthesia Preprocedure Evaluation (Addendum)
Anesthesia Evaluation  ?Patient identified by MRN, date of birth, ID band ?Patient awake ? ? ? ?Reviewed: ?Allergy & Precautions, NPO status , Patient's Chart, lab work & pertinent test results ? ?History of Anesthesia Complications ?(+) PONV and history of anesthetic complications ? ?Airway ?Mallampati: I ? ?TM Distance: >3 FB ?Neck ROM: Full ? ? ? Dental ?no notable dental hx. ?(+) Caps, Teeth Intact, Dental Advisory Given ?  ?Pulmonary ?sleep apnea and Continuous Positive Airway Pressure Ventilation , Patient abstained from smoking., former smoker,  ?  ?Pulmonary exam normal ?breath sounds clear to auscultation ? ? ? ? ? ? Cardiovascular ?+ DVT  ?Normal cardiovascular exam+ dysrhythmias  ?Rhythm:Regular Rate:Normal ? ? ?  ?Neuro/Psych ? Headaches, Seizures -, Well Controlled,  PSYCHIATRIC DISORDERS Anxiety Depression Hx/o cerebral aneurysm ?CVA, No Residual Symptoms   ? GI/Hepatic ?Neg liver ROS, GERD  Medicated and Controlled,  ?Endo/Other  ?negative endocrine ROS ? Renal/GU ?negative Renal ROS  ?negative genitourinary ?  ?Musculoskeletal ? ?(+) Arthritis , Osteoarthritis,   ? Abdominal ?  ?Peds ? Hematology ?negative hematology ROS ?(+)   ?Anesthesia Other Findings ? ? Reproductive/Obstetrics ? ?  ? ? ? ? ? ? ? ? ? ? ? ? ? ?  ?  ? ? ? ? ? ? ? ? ?Anesthesia Physical ?Anesthesia Plan ? ?ASA: 2 ? ?Anesthesia Plan: General  ? ?Post-op Pain Management:   ? ?Induction: Intravenous and Cricoid pressure planned ? ?PONV Risk Score and Plan: 4 or greater and Scopolamine patch - Pre-op, Treatment may vary due to age or medical condition, Ondansetron and Dexamethasone ? ?Airway Management Planned: Oral ETT ? ?Additional Equipment:  ? ?Intra-op Plan:  ? ?Post-operative Plan: Extubation in OR ? ?Informed Consent: I have reviewed the patients History and Physical, chart, labs and discussed the procedure including the risks, benefits and alternatives for the proposed anesthesia with the  patient or authorized representative who has indicated his/her understanding and acceptance.  ? ? ? ?Dental advisory given ? ?Plan Discussed with: CRNA and Anesthesiologist ? ?Anesthesia Plan Comments:   ? ? ? ? ? ? ?Anesthesia Quick Evaluation ? ?

## 2022-01-08 ENCOUNTER — Encounter (HOSPITAL_BASED_OUTPATIENT_CLINIC_OR_DEPARTMENT_OTHER): Payer: Self-pay | Admitting: Surgery

## 2022-01-08 LAB — SURGICAL PATHOLOGY

## 2022-01-13 ENCOUNTER — Other Ambulatory Visit (HOSPITAL_BASED_OUTPATIENT_CLINIC_OR_DEPARTMENT_OTHER): Payer: Self-pay

## 2022-02-19 ENCOUNTER — Other Ambulatory Visit (HOSPITAL_BASED_OUTPATIENT_CLINIC_OR_DEPARTMENT_OTHER): Payer: Self-pay

## 2022-02-20 ENCOUNTER — Other Ambulatory Visit (HOSPITAL_BASED_OUTPATIENT_CLINIC_OR_DEPARTMENT_OTHER): Payer: Self-pay

## 2022-02-20 MED ORDER — ALPRAZOLAM 0.5 MG PO TABS
ORAL_TABLET | ORAL | 1 refills | Status: DC
  Filled 2022-02-20: qty 45, 23d supply, fill #0
  Filled 2022-03-26: qty 45, 23d supply, fill #1

## 2022-02-25 DIAGNOSIS — Z87891 Personal history of nicotine dependence: Secondary | ICD-10-CM | POA: Diagnosis not present

## 2022-02-25 DIAGNOSIS — Z0000 Encounter for general adult medical examination without abnormal findings: Secondary | ICD-10-CM | POA: Diagnosis not present

## 2022-02-25 DIAGNOSIS — R7303 Prediabetes: Secondary | ICD-10-CM | POA: Diagnosis not present

## 2022-02-25 DIAGNOSIS — Z8249 Family history of ischemic heart disease and other diseases of the circulatory system: Secondary | ICD-10-CM | POA: Diagnosis not present

## 2022-02-25 DIAGNOSIS — Z0001 Encounter for general adult medical examination with abnormal findings: Secondary | ICD-10-CM | POA: Diagnosis not present

## 2022-02-25 DIAGNOSIS — E7800 Pure hypercholesterolemia, unspecified: Secondary | ICD-10-CM | POA: Diagnosis not present

## 2022-02-25 DIAGNOSIS — Z83438 Family history of other disorder of lipoprotein metabolism and other lipidemia: Secondary | ICD-10-CM | POA: Diagnosis not present

## 2022-02-25 DIAGNOSIS — Z79899 Other long term (current) drug therapy: Secondary | ICD-10-CM | POA: Diagnosis not present

## 2022-03-17 ENCOUNTER — Other Ambulatory Visit (HOSPITAL_BASED_OUTPATIENT_CLINIC_OR_DEPARTMENT_OTHER): Payer: Self-pay

## 2022-03-18 ENCOUNTER — Other Ambulatory Visit (HOSPITAL_BASED_OUTPATIENT_CLINIC_OR_DEPARTMENT_OTHER): Payer: Self-pay

## 2022-03-18 MED ORDER — METOPROLOL SUCCINATE ER 25 MG PO TB24
25.0000 mg | ORAL_TABLET | Freq: Every day | ORAL | 0 refills | Status: DC
  Filled 2022-03-18: qty 90, 90d supply, fill #0

## 2022-03-26 ENCOUNTER — Other Ambulatory Visit (HOSPITAL_BASED_OUTPATIENT_CLINIC_OR_DEPARTMENT_OTHER): Payer: Self-pay

## 2022-04-30 ENCOUNTER — Other Ambulatory Visit (HOSPITAL_BASED_OUTPATIENT_CLINIC_OR_DEPARTMENT_OTHER): Payer: Self-pay

## 2022-04-30 MED ORDER — ALPRAZOLAM 0.5 MG PO TABS
0.2500 mg | ORAL_TABLET | Freq: Two times a day (BID) | ORAL | 1 refills | Status: DC | PRN
  Filled 2022-04-30: qty 45, 23d supply, fill #0
  Filled 2022-06-04: qty 45, 23d supply, fill #1

## 2022-06-04 ENCOUNTER — Other Ambulatory Visit (HOSPITAL_BASED_OUTPATIENT_CLINIC_OR_DEPARTMENT_OTHER): Payer: Self-pay

## 2022-06-18 ENCOUNTER — Other Ambulatory Visit (HOSPITAL_BASED_OUTPATIENT_CLINIC_OR_DEPARTMENT_OTHER): Payer: Self-pay

## 2022-06-19 ENCOUNTER — Other Ambulatory Visit (HOSPITAL_BASED_OUTPATIENT_CLINIC_OR_DEPARTMENT_OTHER): Payer: Self-pay

## 2022-06-20 ENCOUNTER — Other Ambulatory Visit (HOSPITAL_BASED_OUTPATIENT_CLINIC_OR_DEPARTMENT_OTHER): Payer: Self-pay

## 2022-06-20 ENCOUNTER — Other Ambulatory Visit (HOSPITAL_COMMUNITY): Payer: Self-pay

## 2022-06-20 MED ORDER — METOPROLOL SUCCINATE ER 25 MG PO TB24
25.0000 mg | ORAL_TABLET | Freq: Every day | ORAL | 0 refills | Status: DC
  Filled 2022-06-20 (×2): qty 90, 90d supply, fill #0

## 2022-07-01 ENCOUNTER — Other Ambulatory Visit (HOSPITAL_BASED_OUTPATIENT_CLINIC_OR_DEPARTMENT_OTHER): Payer: Self-pay

## 2022-07-01 MED ORDER — VENLAFAXINE HCL ER 75 MG PO CP24
ORAL_CAPSULE | ORAL | 11 refills | Status: AC
  Filled 2022-07-01: qty 30, 30d supply, fill #0
  Filled 2022-07-29: qty 30, 30d supply, fill #1
  Filled 2022-08-29: qty 30, 30d supply, fill #2
  Filled 2022-09-28: qty 30, 30d supply, fill #3
  Filled 2022-10-29 – 2022-10-30 (×2): qty 30, 30d supply, fill #4

## 2022-07-09 ENCOUNTER — Other Ambulatory Visit (HOSPITAL_BASED_OUTPATIENT_CLINIC_OR_DEPARTMENT_OTHER): Payer: Self-pay

## 2022-07-09 MED ORDER — ALPRAZOLAM 0.5 MG PO TABS
0.2500 mg | ORAL_TABLET | Freq: Two times a day (BID) | ORAL | 1 refills | Status: DC | PRN
  Filled 2022-07-09: qty 45, 23d supply, fill #0
  Filled 2022-08-09: qty 45, 23d supply, fill #1

## 2022-07-30 ENCOUNTER — Other Ambulatory Visit (HOSPITAL_BASED_OUTPATIENT_CLINIC_OR_DEPARTMENT_OTHER): Payer: Self-pay

## 2022-08-10 ENCOUNTER — Other Ambulatory Visit (HOSPITAL_BASED_OUTPATIENT_CLINIC_OR_DEPARTMENT_OTHER): Payer: Self-pay

## 2022-08-29 ENCOUNTER — Other Ambulatory Visit (HOSPITAL_COMMUNITY): Payer: Self-pay

## 2022-09-13 ENCOUNTER — Other Ambulatory Visit (HOSPITAL_BASED_OUTPATIENT_CLINIC_OR_DEPARTMENT_OTHER): Payer: Self-pay

## 2022-09-14 ENCOUNTER — Other Ambulatory Visit (HOSPITAL_BASED_OUTPATIENT_CLINIC_OR_DEPARTMENT_OTHER): Payer: Self-pay

## 2022-09-14 MED ORDER — ALPRAZOLAM 0.5 MG PO TABS
0.2500 mg | ORAL_TABLET | Freq: Two times a day (BID) | ORAL | 1 refills | Status: AC | PRN
  Filled 2022-09-14: qty 45, 23d supply, fill #0
  Filled 2022-10-18: qty 45, 23d supply, fill #1

## 2022-09-14 MED ORDER — METOPROLOL SUCCINATE ER 25 MG PO TB24
25.0000 mg | ORAL_TABLET | Freq: Every day | ORAL | 0 refills | Status: AC
  Filled 2022-09-14: qty 30, 30d supply, fill #0
  Filled 2022-10-15: qty 30, 30d supply, fill #1

## 2022-09-28 ENCOUNTER — Other Ambulatory Visit (HOSPITAL_BASED_OUTPATIENT_CLINIC_OR_DEPARTMENT_OTHER): Payer: Self-pay

## 2022-10-15 ENCOUNTER — Other Ambulatory Visit (HOSPITAL_BASED_OUTPATIENT_CLINIC_OR_DEPARTMENT_OTHER): Payer: Self-pay

## 2022-10-29 ENCOUNTER — Other Ambulatory Visit: Payer: Self-pay

## 2022-10-30 ENCOUNTER — Other Ambulatory Visit (HOSPITAL_BASED_OUTPATIENT_CLINIC_OR_DEPARTMENT_OTHER): Payer: Self-pay

## 2023-02-05 ENCOUNTER — Other Ambulatory Visit (HOSPITAL_BASED_OUTPATIENT_CLINIC_OR_DEPARTMENT_OTHER): Payer: Self-pay

## 2023-02-05 MED ORDER — METOPROLOL SUCCINATE ER 25 MG PO TB24
25.0000 mg | ORAL_TABLET | Freq: Every day | ORAL | 3 refills | Status: DC
  Filled 2023-02-05: qty 30, 30d supply, fill #0

## 2023-02-05 MED ORDER — ALPRAZOLAM 0.5 MG PO TABS
0.5000 mg | ORAL_TABLET | Freq: Two times a day (BID) | ORAL | 1 refills | Status: DC | PRN
  Filled 2023-02-05: qty 45, 23d supply, fill #0
  Filled 2023-03-16: qty 45, 23d supply, fill #1

## 2023-02-10 ENCOUNTER — Other Ambulatory Visit (HOSPITAL_BASED_OUTPATIENT_CLINIC_OR_DEPARTMENT_OTHER): Payer: Self-pay

## 2023-02-12 ENCOUNTER — Other Ambulatory Visit (HOSPITAL_BASED_OUTPATIENT_CLINIC_OR_DEPARTMENT_OTHER): Payer: Self-pay

## 2023-03-16 ENCOUNTER — Other Ambulatory Visit: Payer: Self-pay

## 2023-03-16 ENCOUNTER — Other Ambulatory Visit (HOSPITAL_BASED_OUTPATIENT_CLINIC_OR_DEPARTMENT_OTHER): Payer: Self-pay
